# Patient Record
Sex: Male | Born: 1945 | Race: White | Hispanic: No | Smoking: Light tobacco smoker
Health system: Southern US, Community
[De-identification: ages and names within clinical notes are randomized; demographics above are authoritative.]

## PROBLEM LIST (undated history)

## (undated) DIAGNOSIS — I10 Essential (primary) hypertension: Secondary | ICD-10-CM

## (undated) DIAGNOSIS — C801 Malignant (primary) neoplasm, unspecified: Secondary | ICD-10-CM

## (undated) DIAGNOSIS — E785 Hyperlipidemia, unspecified: Secondary | ICD-10-CM

## (undated) HISTORY — PX: SKIN CANCER EXCISION: SHX779

## (undated) HISTORY — DX: Hyperlipidemia, unspecified: E78.5

## (undated) HISTORY — DX: Malignant (primary) neoplasm, unspecified: C80.1

## (undated) HISTORY — DX: Essential (primary) hypertension: I10

---

## 2012-10-18 ENCOUNTER — Telehealth: Payer: Self-pay | Admitting: Family Medicine

## 2012-10-18 NOTE — Telephone Encounter (Signed)
Refused penile pump, pt. Has never been seen for this reason. Left pt a message. Company notified.

## 2012-10-18 NOTE — Telephone Encounter (Signed)
COMPANY IS CHECKING ON STATUS OF REQUEST

## 2013-01-02 ENCOUNTER — Other Ambulatory Visit: Payer: Self-pay

## 2013-01-02 MED ORDER — HYDROCHLOROTHIAZIDE 25 MG PO TABS: 25.0000 mg | ORAL_TABLET | Freq: Every day | ORAL | Status: DC

## 2013-01-02 MED ORDER — PRAVASTATIN SODIUM 40 MG PO TABS: 40.0000 mg | ORAL_TABLET | Freq: Every day | ORAL | Status: DC

## 2013-01-02 NOTE — Telephone Encounter (Signed)
Last seen 07/04/12    Last labs 6/13

## 2013-01-03 ENCOUNTER — Telehealth: Payer: Self-pay | Admitting: *Deleted

## 2013-01-03 NOTE — Telephone Encounter (Signed)
Called pt regarding RXs Per pt he has enough until appt on 01/04/2013 with MMM

## 2013-01-04 ENCOUNTER — Encounter: Payer: Self-pay | Admitting: Nurse Practitioner

## 2013-01-04 ENCOUNTER — Ambulatory Visit (INDEPENDENT_AMBULATORY_CARE_PROVIDER_SITE_OTHER): Payer: Medicare Other | Admitting: Nurse Practitioner

## 2013-01-04 VITALS — BP 103/56 | HR 87 | Temp 96.6°F | Ht 72.0 in | Wt 242.0 lb

## 2013-01-04 DIAGNOSIS — Z125 Encounter for screening for malignant neoplasm of prostate: Secondary | ICD-10-CM

## 2013-01-04 DIAGNOSIS — E785 Hyperlipidemia, unspecified: Secondary | ICD-10-CM | POA: Insufficient documentation

## 2013-01-04 DIAGNOSIS — I10 Essential (primary) hypertension: Secondary | ICD-10-CM

## 2013-01-04 LAB — COMPLETE METABOLIC PANEL WITH GFR
Alkaline Phosphatase: 78 U/L (ref 39–117)
BUN: 24 mg/dL — ABNORMAL HIGH (ref 6–23)
Creat: 2.54 mg/dL — ABNORMAL HIGH (ref 0.50–1.35)
GFR, Est Non African American: 25 mL/min — ABNORMAL LOW
Glucose, Bld: 121 mg/dL — ABNORMAL HIGH (ref 70–99)
Total Bilirubin: 0.8 mg/dL (ref 0.3–1.2)

## 2013-01-04 LAB — PSA: PSA: 0.2 ng/mL (ref <=4.00)

## 2013-01-04 MED ORDER — LOSARTAN POTASSIUM 100 MG PO TABS: 100.0000 mg | ORAL_TABLET | Freq: Every day | ORAL | Status: DC

## 2013-01-04 MED ORDER — HYDROCHLOROTHIAZIDE 25 MG PO TABS: 25.0000 mg | ORAL_TABLET | Freq: Every day | ORAL | Status: DC

## 2013-01-04 MED ORDER — PRAVASTATIN SODIUM 40 MG PO TABS: 40.0000 mg | ORAL_TABLET | Freq: Every day | ORAL | Status: DC

## 2013-01-04 NOTE — Progress Notes (Signed)
  Subjective:    Patient ID: Dean Mcconnell, male    DOB: [birth year], 67 y.o.   MRN: [id number]  Hypertension This is a chronic problem. The current episode started more than 1 year ago. The problem has been resolved since onset. The problem is controlled. Pertinent negatives include no blurred vision, chest pain, headaches, malaise/fatigue, peripheral edema or shortness of breath. There are no associated agents to hypertension. Risk factors for coronary artery disease include dyslipidemia, obesity and male gender. Past treatments include angiotensin blockers and diuretics. The current treatment provides significant improvement. There are no compliance problems.   Hyperlipidemia This is a chronic problem. The current episode started more than 1 year ago. The problem is controlled. Recent lipid tests were reviewed and are normal. Exacerbating diseases include obesity. There are no known factors aggravating his hyperlipidemia. Pertinent negatives include no chest pain or shortness of breath. Current antihyperlipidemic treatment includes statins. The current treatment provides moderate improvement of lipids. There are no compliance problems.  Risk factors for coronary artery disease include hypertension, male sex and obesity.      Review of Systems  Constitutional: Negative for malaise/fatigue.  Eyes: Negative for blurred vision.  Respiratory: Negative for shortness of breath.   Cardiovascular: Negative for chest pain.  Neurological: Negative for headaches.  All other systems reviewed and are negative.       Objective:   Physical Exam  Constitutional: He is oriented to person, place, and time. He appears well-developed and well-nourished.  HENT:  Head: Normocephalic.  Right Ear: External ear normal.  Left Ear: External ear normal.  Nose: Nose normal.  Mouth/Throat: Oropharynx is clear and moist.  Eyes: EOM are normal. Pupils are equal, round, and reactive to light.  Neck: Normal range of  motion. Neck supple. No thyromegaly present.  Cardiovascular: Normal rate, regular rhythm, normal heart sounds and intact distal pulses.   No murmur heard. Pulmonary/Chest: Effort normal and breath sounds normal. He has no wheezes. He has no rales.  Abdominal: Soft. Bowel sounds are normal.  Genitourinary: Prostate normal and penis normal.  Musculoskeletal: Normal range of motion.  Neurological: He is alert and oriented to person, place, and time.  Skin: Skin is warm and dry.  Psychiatric: He has a normal mood and affect. His behavior is normal. Judgment and thought content normal.  BP 103/56  Pulse 87  Temp(Src) 96.6 F (35.9 C) (Oral)  Ht 6' (1.829 m)  Wt 242 lb (109.77 kg)  BMI 32.81 kg/m2         Assessment & Plan:  1. Hypertension Low NA+ diet - COMPLETE METABOLIC PANEL WITH GFR - hydrochlorothiazide (HYDRODIURIL) 25 MG tablet; Take 1 tablet (25 mg total) by mouth daily.  Dispense: 30 tablet; Refill: 5 - losartan (COZAAR) 100 MG tablet; Take 1 tablet (100 mg total) by mouth daily.  Dispense: 30 tablet; Refill: 5  2. Hyperlipidemia Low fat diet and exercise - NMR Lipoprofile with Lipids - pravastatin (PRAVACHOL) 40 MG tablet; Take 1 tablet (40 mg total) by mouth daily.  Dispense: 30 tablet; Refill: 5  3. Prostate cancer screening - PSA  * DRE doen 12/13- By A. MAier Mary-Margaret Daphine Deutscher, FNP

## 2013-01-04 NOTE — Patient Instructions (Signed)
Health Maintenance, Males A healthy lifestyle and preventative care can promote health and wellness.  Maintain regular health, dental, and eye exams.  Eat a healthy diet. Foods like vegetables, fruits, whole grains, low-fat dairy products, and lean protein foods contain the nutrients you need without too many calories. Decrease your intake of foods high in solid fats, added sugars, and salt. Get information about a proper diet from your caregiver, if necessary.  Regular physical exercise is one of the most important things you can do for your health. Most adults should get at least 150 minutes of moderate-intensity exercise (any activity that increases your heart rate and causes you to sweat) each week. In addition, most adults need muscle-strengthening exercises on 2 or more days a week.   Maintain a healthy weight. The body mass index (BMI) is a screening tool to identify possible weight problems. It provides an estimate of body fat based on height and weight. Your caregiver can help determine your BMI, and can help you achieve or maintain a healthy weight. For adults 20 years and older:  A BMI below 18.5 is considered underweight.  A BMI of 18.5 to 24.9 is normal.  A BMI of 25 to 29.9 is considered overweight.  A BMI of 30 and above is considered obese.  Maintain normal blood lipids and cholesterol by exercising and minimizing your intake of saturated fat. Eat a balanced diet with plenty of fruits and vegetables. Blood tests for lipids and cholesterol should begin at age 20 and be repeated every 5 years. If your lipid or cholesterol levels are high, you are over 50, or you are a high risk for heart disease, you may need your cholesterol levels checked more frequently.Ongoing high lipid and cholesterol levels should be treated with medicines, if diet and exercise are not effective.  If you smoke, find out from your caregiver how to quit. If you do not use tobacco, do not start.  If you  choose to drink alcohol, do not exceed 2 drinks per day. One drink is considered to be 12 ounces (355 mL) of beer, 5 ounces (148 mL) of wine, or 1.5 ounces (44 mL) of liquor.  Avoid use of street drugs. Do not share needles with anyone. Ask for help if you need support or instructions about stopping the use of drugs.  High blood pressure causes heart disease and increases the risk of stroke. Blood pressure should be checked at least every 1 to 2 years. Ongoing high blood pressure should be treated with medicines if weight loss and exercise are not effective.  If you are 45 to 66 years old, ask your caregiver if you should take aspirin to prevent heart disease.  Diabetes screening involves taking a blood sample to check your fasting blood sugar level. This should be done once every 3 years, after age 45, if you are within normal weight and without risk factors for diabetes. Testing should be considered at a younger age or be carried out more frequently if you are overweight and have at least 1 risk factor for diabetes.  Colorectal cancer can be detected and often prevented. Most routine colorectal cancer screening begins at the age of 50 and continues through age 75. However, your caregiver may recommend screening at an earlier age if you have risk factors for colon cancer. On a yearly basis, your caregiver may provide home test kits to check for hidden blood in the stool. Use of a small camera at the end of a tube,   to directly examine the colon (sigmoidoscopy or colonoscopy), can detect the earliest forms of colorectal cancer. Talk to your caregiver about this at age 50, when routine screening begins. Direct examination of the colon should be repeated every 5 to 10 years through age 75, unless early forms of pre-cancerous polyps or small growths are found.  Hepatitis C blood testing is recommended for all people born from 1945 through 1965 and any individual with known risks for hepatitis C.  Healthy  men should no longer receive prostate-specific antigen (PSA) blood tests as part of routine cancer screening. Consult with your caregiver about prostate cancer screening.  Testicular cancer screening is not recommended for adolescents or adult males who have no symptoms. Screening includes self-exam, caregiver exam, and other screening tests. Consult with your caregiver about any symptoms you have or any concerns you have about testicular cancer.  Practice safe sex. Use condoms and avoid high-risk sexual practices to reduce the spread of sexually transmitted infections (STIs).  Use sunscreen with a sun protection factor (SPF) of 30 or greater. Apply sunscreen liberally and repeatedly throughout the day. You should seek shade when your shadow is shorter than you. Protect yourself by wearing long sleeves, pants, a wide-brimmed hat, and sunglasses year round, whenever you are outdoors.  Notify your caregiver of new moles or changes in moles, especially if there is a change in shape or color. Also notify your caregiver if a mole is larger than the size of a pencil eraser.  A one-time screening for abdominal aortic aneurysm (AAA) and surgical repair of large AAAs by sound wave imaging (ultrasonography) is recommended for ages 65 to 75 years who are current or former smokers.  Stay current with your immunizations. Document Released: 01/16/2008 Document Revised: 10/12/2011 Document Reviewed: 12/15/2010 [employer] Patient Information 2014 [employer].  

## 2013-01-05 ENCOUNTER — Other Ambulatory Visit: Payer: Self-pay | Admitting: Nurse Practitioner

## 2013-01-05 ENCOUNTER — Other Ambulatory Visit: Payer: Self-pay | Admitting: *Deleted

## 2013-01-05 DIAGNOSIS — I10 Essential (primary) hypertension: Secondary | ICD-10-CM

## 2013-01-05 LAB — NMR LIPOPROFILE WITH LIPIDS
HDL Size: 8.3 nm — ABNORMAL LOW (ref >=9.2)
HDL-C: 30 mg/dL — ABNORMAL LOW (ref >=40)
LDL (calc): 94 mg/dL (ref <100)
LDL Particle Number: 1636 nmol/L — ABNORMAL HIGH (ref <1000)
LP-IR Score: 66 — ABNORMAL HIGH (ref <=45)
Large VLDL-P: 2.3 nmol/L (ref <=2.7)
Triglycerides: 153 mg/dL — ABNORMAL HIGH (ref <150)
VLDL Size: 44.4 nm (ref <=46.6)

## 2013-01-05 MED ORDER — HYDROCHLOROTHIAZIDE 25 MG PO TABS: 25.0000 mg | ORAL_TABLET | Freq: Every day | ORAL | Status: DC

## 2013-01-05 MED ORDER — ATORVASTATIN CALCIUM 40 MG PO TABS: 40.0000 mg | ORAL_TABLET | Freq: Every day | ORAL | Status: DC

## 2013-01-05 MED ORDER — LOSARTAN POTASSIUM 100 MG PO TABS: 100.0000 mg | ORAL_TABLET | Freq: Every day | ORAL | Status: DC

## 2013-04-10 ENCOUNTER — Ambulatory Visit (INDEPENDENT_AMBULATORY_CARE_PROVIDER_SITE_OTHER): Payer: Medicare Other | Admitting: Nurse Practitioner

## 2013-04-10 ENCOUNTER — Encounter: Payer: Self-pay | Admitting: Nurse Practitioner

## 2013-04-10 VITALS — BP 134/80 | HR 73 | Temp 97.1°F | Ht 72.0 in | Wt 230.0 lb

## 2013-04-10 DIAGNOSIS — E785 Hyperlipidemia, unspecified: Secondary | ICD-10-CM

## 2013-04-10 DIAGNOSIS — I10 Essential (primary) hypertension: Secondary | ICD-10-CM

## 2013-04-10 MED ORDER — SILDENAFIL CITRATE 100 MG PO TABS: 50.0000 mg | ORAL_TABLET | Freq: Every day | ORAL | Status: DC | PRN

## 2013-04-10 MED ORDER — HYDROCHLOROTHIAZIDE 25 MG PO TABS: 25.0000 mg | ORAL_TABLET | Freq: Every day | ORAL | Status: DC

## 2013-04-10 MED ORDER — SILDENAFIL CITRATE 100 MG PO TABS: 100.0000 mg | ORAL_TABLET | Freq: Every day | ORAL | Status: DC | PRN

## 2013-04-10 MED ORDER — LOSARTAN POTASSIUM 100 MG PO TABS: 100.0000 mg | ORAL_TABLET | Freq: Every day | ORAL | Status: DC

## 2013-04-10 MED ORDER — ATORVASTATIN CALCIUM 40 MG PO TABS: 40.0000 mg | ORAL_TABLET | Freq: Every day | ORAL | Status: DC

## 2013-04-10 NOTE — Progress Notes (Signed)
  Subjective:    Patient ID: Dean Mcconnell, male    DOB: 12-27-1945, 67 y.o.   MRN: [id number]  Hypertension This is a chronic problem. The current episode started more than 1 year ago. The problem has been resolved since onset. The problem is controlled. Pertinent negatives include no blurred vision, chest pain, headaches, malaise/fatigue, peripheral edema or shortness of breath. There are no associated agents to hypertension. Risk factors for coronary artery disease include dyslipidemia, obesity and male gender. Past treatments include angiotensin blockers and diuretics. The current treatment provides significant improvement. There are no compliance problems.   Hyperlipidemia This is a chronic problem. The current episode started more than 1 year ago. The problem is controlled. Recent lipid tests were reviewed and are normal. Exacerbating diseases include obesity. There are no known factors aggravating his hyperlipidemia. Pertinent negatives include no chest pain or shortness of breath. Current antihyperlipidemic treatment includes statins. The current treatment provides moderate improvement of lipids. There are no compliance problems.  Risk factors for coronary artery disease include hypertension, male sex and obesity.      Review of Systems  Constitutional: Negative for malaise/fatigue.  Eyes: Negative for blurred vision.  Respiratory: Negative for shortness of breath.   Cardiovascular: Negative for chest pain.  Neurological: Negative for headaches.  All other systems reviewed and are negative.       Objective:   Physical Exam  Constitutional: He is oriented to person, place, and time. He appears well-developed and well-nourished.  HENT:  Head: Normocephalic.  Right Ear: External ear normal.  Left Ear: External ear normal.  Nose: Nose normal.  Mouth/Throat: Oropharynx is clear and moist.  Eyes: EOM are normal. Pupils are equal, round, and reactive to light.  Neck: Normal range of  motion. Neck supple. No thyromegaly present.  Cardiovascular: Normal rate, regular rhythm, normal heart sounds and intact distal pulses.   No murmur heard. Pulmonary/Chest: Effort normal and breath sounds normal. He has no wheezes. He has no rales.  Abdominal: Soft. Bowel sounds are normal.  Genitourinary: Prostate normal and penis normal.  Musculoskeletal: Normal range of motion.  Neurological: He is alert and oriented to person, place, and time.  Skin: Skin is warm and dry.  Psychiatric: He has a normal mood and affect. His behavior is normal. Judgment and thought content normal.  BP 134/80  Pulse 73  Temp(Src) 97.1 F (36.2 C) (Oral)  Ht 6' (1.829 m)  Wt 230 lb (104.327 kg)  BMI 31.19 kg/m2         Assessment & Plan:  1. Hypertension Low NA= diet - CMP14+EGFR - hydrochlorothiazide (HYDRODIURIL) 25 MG tablet; Take 1 tablet (25 mg total) by mouth daily.  Dispense: 90 tablet; Refill: 1 - losartan (COZAAR) 100 MG tablet; Take 1 tablet (100 mg total) by mouth daily.  Dispense: 90 tablet; Refill: 1  2. Hyperlipidemia Low fat diet and exercise - NMR, lipoprofile - atorvastatin (LIPITOR) 40 MG tablet; Take 1 tablet (40 mg total) by mouth daily.  Dispense: 90 tablet; Refill: 1  Follow up in 3 months Health maintenance reviewed  Mary-Margaret Daphine Deutscher, FNP

## 2013-04-10 NOTE — Patient Instructions (Addendum)

## 2013-06-01 ENCOUNTER — Telehealth: Payer: Self-pay | Admitting: Nurse Practitioner

## 2013-06-01 DIAGNOSIS — I10 Essential (primary) hypertension: Secondary | ICD-10-CM

## 2013-06-01 MED ORDER — LOSARTAN POTASSIUM 100 MG PO TABS: 100.0000 mg | ORAL_TABLET | Freq: Every day | ORAL | Status: DC

## 2013-06-01 MED ORDER — HYDROCHLOROTHIAZIDE 25 MG PO TABS: 25.0000 mg | ORAL_TABLET | Freq: Every day | ORAL | Status: DC

## 2013-06-01 NOTE — Telephone Encounter (Signed)
rx sent to pharmacy- will pay the same for a few meds as he would for a month supply

## 2013-07-10 ENCOUNTER — Ambulatory Visit (INDEPENDENT_AMBULATORY_CARE_PROVIDER_SITE_OTHER): Payer: Medicare Other | Admitting: Nurse Practitioner

## 2013-07-10 ENCOUNTER — Encounter: Payer: Self-pay | Admitting: Nurse Practitioner

## 2013-07-10 VITALS — BP 137/71 | HR 68 | Temp 97.0°F | Ht 72.0 in | Wt 215.0 lb

## 2013-07-10 DIAGNOSIS — E785 Hyperlipidemia, unspecified: Secondary | ICD-10-CM

## 2013-07-10 DIAGNOSIS — I10 Essential (primary) hypertension: Secondary | ICD-10-CM

## 2013-07-10 MED ORDER — ATORVASTATIN CALCIUM 40 MG PO TABS
40.0000 mg | ORAL_TABLET | Freq: Every day | ORAL | Status: DC
Start: 2013-07-10 — End: 2013-10-11

## 2013-07-10 MED ORDER — HYDROCHLOROTHIAZIDE 25 MG PO TABS: 25.0000 mg | ORAL_TABLET | Freq: Every day | ORAL | Status: DC

## 2013-07-10 MED ORDER — LOSARTAN POTASSIUM 100 MG PO TABS: 100.0000 mg | ORAL_TABLET | Freq: Every day | ORAL | Status: DC

## 2013-07-10 NOTE — Patient Instructions (Signed)

## 2013-07-10 NOTE — Progress Notes (Signed)
Subjective:    Patient ID: Dean Mcconnell, male    DOB: 1946-04-04, 67 y.o.   MRN: [id number]  Hypertension This is a chronic problem. The current episode started more than 1 year ago. The problem has been resolved since onset. The problem is controlled. Pertinent negatives include no blurred vision, chest pain, headaches, malaise/fatigue, peripheral edema or shortness of breath. There are no associated agents to hypertension. Risk factors for coronary artery disease include dyslipidemia, obesity and male gender. Past treatments include angiotensin blockers and diuretics. The current treatment provides significant improvement. There are no compliance problems.   Hyperlipidemia This is a chronic problem. The current episode started more than 1 year ago. The problem is controlled. Recent lipid tests were reviewed and are normal. Exacerbating diseases include obesity. There are no known factors aggravating his hyperlipidemia. Pertinent negatives include no chest pain or shortness of breath. Current antihyperlipidemic treatment includes statins. The current treatment provides moderate improvement of lipids. There are no compliance problems.  Risk factors for coronary artery disease include hypertension, male sex and obesity.      Review of Systems  Constitutional: Negative for malaise/fatigue.  Eyes: Negative for blurred vision.  Respiratory: Negative for shortness of breath.   Cardiovascular: Negative for chest pain.  Neurological: Negative for headaches.  All other systems reviewed and are negative.       Objective:   Physical Exam  Constitutional: He is oriented to person, place, and time. He appears well-developed and well-nourished.  HENT:  Head: Normocephalic.  Right Ear: External ear normal.  Left Ear: External ear normal.  Nose: Nose normal.  Mouth/Throat: Oropharynx is clear and moist.  Eyes: EOM are normal. Pupils are equal, round, and reactive to light.  Neck: Normal range of  motion. Neck supple. No thyromegaly present.  Cardiovascular: Normal rate, regular rhythm, normal heart sounds and intact distal pulses.   No murmur heard. Pulmonary/Chest: Effort normal and breath sounds normal. He has no wheezes. He has no rales.  Abdominal: Soft. Bowel sounds are normal.  Genitourinary: Penis normal.  Musculoskeletal: Normal range of motion.  Neurological: He is alert and oriented to person, place, and time.  Skin: Skin is warm and dry.  Psychiatric: He has a normal mood and affect. His behavior is normal. Judgment and thought content normal.  BP 137/71  Pulse 68  Temp(Src) 97 F (36.1 C) (Oral)  Ht 6' (1.829 m)  Wt 215 lb (97.523 kg)  BMI 29.15 kg/m2         Assessment & Plan:   1. Hypertension   2. Hyperlipidemia    Orders Placed This Encounter  Procedures  . CMP14+EGFR  . NMR, lipoprofile   Meds ordered this encounter  Medications  . atorvastatin (LIPITOR) 40 MG tablet    Sig: Take 1 tablet (40 mg total) by mouth daily.    Dispense:  93 tablet    Refill:  1    Order Specific Question:  Supervising Provider    Answer:  Ernestina Penna [[id number]]  . hydrochlorothiazide (HYDRODIURIL) 25 MG tablet    Sig: Take 1 tablet (25 mg total) by mouth daily.    Dispense:  93 tablet    Refill:  1    Order Specific Question:  Supervising Provider    Answer:  Ernestina Penna [[id number]]  . losartan (COZAAR) 100 MG tablet    Sig: Take 1 tablet (100 mg total) by mouth daily.    Dispense:  93 tablet    Refill:  1  Order Specific Question:  Supervising Provider    Answer:  Ernestina Penna [[id number]]    Continue all meds Labs pending Diet and exercise encouraged Health maintenance reviewed Follow up in 3 months  Mary-Margaret Daphine Deutscher, FNP

## 2013-07-12 LAB — NMR, LIPOPROFILE
HDL Cholesterol by NMR: 36 mg/dL — ABNORMAL LOW (ref >=40)
HDL Particle Number: 28.3 umol/L — ABNORMAL LOW (ref >=30.5)
LDL Size: 19.7 nm — ABNORMAL LOW (ref >20.5)
LP-IR Score: 60 — ABNORMAL HIGH (ref <=45)
Small LDL Particle Number: 729 nmol/L — ABNORMAL HIGH (ref <=527)
Triglycerides by NMR: 88 mg/dL (ref <150)

## 2013-07-12 LAB — CMP14+EGFR
ALT: 6 IU/L (ref 0–44)
AST: 11 IU/L (ref 0–40)
Albumin/Globulin Ratio: 2.2 (ref 1.1–2.5)
Alkaline Phosphatase: 111 IU/L (ref 39–117)
Chloride: 99 mmol/L (ref 97–108)
Creatinine, Ser: 1.53 mg/dL — ABNORMAL HIGH (ref 0.76–1.27)
GFR calc Af Amer: 54 mL/min/{1.73_m2} — ABNORMAL LOW (ref >59)
GFR calc non Af Amer: 47 mL/min/{1.73_m2} — ABNORMAL LOW (ref >59)
Globulin, Total: 2 g/dL (ref 1.5–4.5)
Glucose: 100 mg/dL — ABNORMAL HIGH (ref 65–99)
Potassium: 3.5 mmol/L (ref 3.5–5.2)
Sodium: 145 mmol/L — ABNORMAL HIGH (ref 134–144)
Total Bilirubin: 0.6 mg/dL (ref 0.0–1.2)
Total Protein: 6.3 g/dL (ref 6.0–8.5)

## 2013-07-24 ENCOUNTER — Telehealth: Payer: Self-pay | Admitting: Family Medicine

## 2013-07-24 NOTE — Telephone Encounter (Signed)
PT AWARE OF LAB RESULTS.  RS °

## 2013-07-24 NOTE — Telephone Encounter (Signed)
Message copied by Azalee Course on Mon Jul 24, 2013 12:31 PM ------      Message from: Bennie Pierini      Created: Wed Jul 12, 2013  2:17 PM       Kidney function has greatly improved      Cholesterol looks great      Continue current meds and recheck in 3 months       ------

## 2013-09-04 ENCOUNTER — Telehealth: Payer: Self-pay | Admitting: Nurse Practitioner

## 2013-09-04 NOTE — Telephone Encounter (Signed)
Quashawn says that his lady friend has a rash on her left shoulder. She has bursitis and has been taking over the counter pain meds to help with pain. Says that now she has a rash that itches and has turned to blisters. Is not painful. Sherrick Araki that more than likely its an allergic reaction to some type of med. Comer states that he bought some hydrocortisone cream and it has helped. Advised that if rash continues that lady NTBS

## 2013-10-11 ENCOUNTER — Ambulatory Visit (INDEPENDENT_AMBULATORY_CARE_PROVIDER_SITE_OTHER): Payer: Medicare Other

## 2013-10-11 ENCOUNTER — Encounter: Payer: Self-pay | Admitting: Nurse Practitioner

## 2013-10-11 ENCOUNTER — Ambulatory Visit (INDEPENDENT_AMBULATORY_CARE_PROVIDER_SITE_OTHER): Payer: Medicare Other | Admitting: Nurse Practitioner

## 2013-10-11 VITALS — BP 118/70 | HR 73 | Temp 97.4°F | Ht 70.0 in | Wt 220.0 lb

## 2013-10-11 DIAGNOSIS — F17200 Nicotine dependence, unspecified, uncomplicated: Secondary | ICD-10-CM

## 2013-10-11 DIAGNOSIS — I10 Essential (primary) hypertension: Secondary | ICD-10-CM

## 2013-10-11 DIAGNOSIS — N529 Male erectile dysfunction, unspecified: Secondary | ICD-10-CM | POA: Insufficient documentation

## 2013-10-11 DIAGNOSIS — E785 Hyperlipidemia, unspecified: Secondary | ICD-10-CM

## 2013-10-11 MED ORDER — HYDROCHLOROTHIAZIDE 25 MG PO TABS: 25.0000 mg | ORAL_TABLET | Freq: Every day | ORAL | Status: DC

## 2013-10-11 MED ORDER — ATORVASTATIN CALCIUM 40 MG PO TABS: 40.0000 mg | ORAL_TABLET | Freq: Every day | ORAL | Status: DC

## 2013-10-11 MED ORDER — LOSARTAN POTASSIUM 100 MG PO TABS: 100.0000 mg | ORAL_TABLET | Freq: Every day | ORAL | Status: DC

## 2013-10-11 NOTE — Progress Notes (Addendum)
Subjective:    Patient ID: Dean Mcconnell, male    DOB: 10-Sep-1945, 68 y.o.   MRN: [id number]  Patient here today for follow of chronic medical problems- No complaints today  Hypertension This is a chronic problem. The current episode started more than 1 year ago. The problem has been resolved since onset. The problem is controlled. Pertinent negatives include no blurred vision, chest pain, headaches, malaise/fatigue, peripheral edema or shortness of breath. There are no associated agents to hypertension. Risk factors for coronary artery disease include dyslipidemia, obesity and male gender. Past treatments include angiotensin blockers and diuretics. The current treatment provides significant improvement. There are no compliance problems.   Hyperlipidemia This is a chronic problem. The current episode started more than 1 year ago. The problem is controlled. Recent lipid tests were reviewed and are normal. Exacerbating diseases include obesity. There are no known factors aggravating his hyperlipidemia. Pertinent negatives include no chest pain or shortness of breath. Current antihyperlipidemic treatment includes statins. The current treatment provides moderate improvement of lipids. There are no compliance problems.  Risk factors for coronary artery disease include hypertension, male sex and obesity.  erectile dysfunction Uses viagra when he can afford to get.   Review of Systems  Constitutional: Negative for malaise/fatigue.  Eyes: Negative for blurred vision.  Respiratory: Negative for shortness of breath.   Cardiovascular: Negative for chest pain.  Neurological: Negative for headaches.  All other systems reviewed and are negative.       Objective:   Physical Exam  Constitutional: He is oriented to person, place, and time. He appears well-developed and well-nourished.  HENT:  Head: Normocephalic.  Right Ear: External ear normal.  Left Ear: External ear normal.  Nose: Nose normal.   Mouth/Throat: Oropharynx is clear and moist.  Eyes: EOM are normal. Pupils are equal, round, and reactive to light.  Neck: Normal range of motion. Neck supple. No thyromegaly present.  Cardiovascular: Normal rate, regular rhythm, normal heart sounds and intact distal pulses.   No murmur heard. Pulmonary/Chest: Effort normal and breath sounds normal. He has no wheezes. He has no rales.  Abdominal: Soft. Bowel sounds are normal.  Genitourinary: Penis normal.  Musculoskeletal: Normal range of motion.  Neurological: He is alert and oriented to person, place, and time.  Skin: Skin is warm and dry.  Psychiatric: He has a normal mood and affect. His behavior is normal. Judgment and thought content normal.  BP 118/70  Pulse 73  Temp(Src) 97.4 F (36.3 C) (Oral)  Ht $R'5\' 10"'oU$  (1.778 m)  Wt 220 lb (99.791 kg)  BMI 31.57 kg/m2  Chest x ray-lear-Preliminary reading by Ronnald Collum, FNP  [facility]  EKG- NSR- Preliminary reading by Ronnald Collum, FNP  [facility]        Assessment & Plan:   1. Hypertension   2. Hyperlipidemia   3. Erectile dysfunction   4. Smoker    Orders Placed This Encounter  Procedures  . DG Chest 2 View    Standing Status: Future     Number of Occurrences: 1     Standing Expiration Date: 12/11/2014    Order Specific Question:  Reason for Exam (SYMPTOM  OR DIAGNOSIS REQUIRED)    Answer:  smoker    Order Specific Question:  Preferred imaging location?    Answer:  Internal  . CMP14+EGFR  . NMR, lipoprofile  . EKG 12-Lead   Meds ordered this encounter  Medications  . atorvastatin (LIPITOR) 40 MG tablet    Sig: Take 1 tablet (  40 mg total) by mouth daily.    Dispense:  93 tablet    Refill:  1    Order Specific Question:  Supervising Provider    Answer:  Chipper Herb [[id number]]  . hydrochlorothiazide (HYDRODIURIL) 25 MG tablet    Sig: Take 1 tablet (25 mg total) by mouth daily.    Dispense:  93 tablet    Refill:  1    Order Specific Question:  Supervising Provider     Answer:  Chipper Herb [[id number]]  . losartan (COZAAR) 100 MG tablet    Sig: Take 1 tablet (100 mg total) by mouth daily.    Dispense:  93 tablet    Refill:  1    Order Specific Question:  Supervising Provider    Answer:  Chipper Herb [[id number]]    Labs pending Health maintenance reviewed Diet and exercise encouraged Continue all meds Follow up  In 3 months   [place], FNP

## 2013-10-11 NOTE — Patient Instructions (Signed)

## 2013-10-13 LAB — CMP14+EGFR
A/G RATIO: 2 (ref 1.1–2.5)
ALT: 11 IU/L (ref 0–44)
AST: 11 IU/L (ref 0–40)
Albumin: 4.4 g/dL (ref 3.6–4.8)
Alkaline Phosphatase: 99 IU/L (ref 39–117)
BUN/Creatinine Ratio: 9 — ABNORMAL LOW (ref 10–22)
BUN: 13 mg/dL (ref 8–27)
CALCIUM: 9.2 mg/dL (ref 8.6–10.2)
CO2: 27 mmol/L (ref 18–29)
CREATININE: 1.43 mg/dL — AB (ref 0.76–1.27)
Chloride: 99 mmol/L (ref 97–108)
GFR calc Af Amer: 59 mL/min/{1.73_m2} — ABNORMAL LOW (ref >59)
GFR, EST NON AFRICAN AMERICAN: 51 mL/min/{1.73_m2} — AB (ref >59)
GLOBULIN, TOTAL: 2.2 g/dL (ref 1.5–4.5)
GLUCOSE: 103 mg/dL — AB (ref 65–99)
Potassium: 4 mmol/L (ref 3.5–5.2)
SODIUM: 143 mmol/L (ref 134–144)
TOTAL PROTEIN: 6.6 g/dL (ref 6.0–8.5)
Total Bilirubin: 0.6 mg/dL (ref 0.0–1.2)

## 2013-10-13 LAB — NMR, LIPOPROFILE
Cholesterol: 128 mg/dL (ref <200)
HDL Cholesterol by NMR: 46 mg/dL (ref >=40)
HDL Particle Number: 32.1 umol/L (ref >=30.5)
LDL Particle Number: 903 nmol/L (ref <1000)
LDL Size: 21 nm (ref >20.5)
LDLC SERPL CALC-MCNC: 65 mg/dL (ref <100)
LP-IR Score: 40 (ref <=45)
SMALL LDL PARTICLE NUMBER: 280 nmol/L (ref <=527)
TRIGLYCERIDES BY NMR: 83 mg/dL (ref <150)

## 2013-12-01 ENCOUNTER — Encounter: Payer: Self-pay | Admitting: *Deleted

## 2014-01-19 ENCOUNTER — Ambulatory Visit (INDEPENDENT_AMBULATORY_CARE_PROVIDER_SITE_OTHER): Payer: Medicare Other | Admitting: Nurse Practitioner

## 2014-01-19 ENCOUNTER — Encounter: Payer: Self-pay | Admitting: Nurse Practitioner

## 2014-01-19 VITALS — BP 120/64 | HR 79 | Temp 98.0°F | Ht 70.0 in | Wt 208.2 lb

## 2014-01-19 DIAGNOSIS — I10 Essential (primary) hypertension: Secondary | ICD-10-CM

## 2014-01-19 DIAGNOSIS — N529 Male erectile dysfunction, unspecified: Secondary | ICD-10-CM

## 2014-01-19 DIAGNOSIS — Z125 Encounter for screening for malignant neoplasm of prostate: Secondary | ICD-10-CM

## 2014-01-19 DIAGNOSIS — E785 Hyperlipidemia, unspecified: Secondary | ICD-10-CM

## 2014-01-19 MED ORDER — HYDROCHLOROTHIAZIDE 25 MG PO TABS: 25.0000 mg | ORAL_TABLET | Freq: Every day | ORAL | Status: DC

## 2014-01-19 MED ORDER — LOSARTAN POTASSIUM 100 MG PO TABS: 100.0000 mg | ORAL_TABLET | Freq: Every day | ORAL | Status: DC

## 2014-01-19 MED ORDER — ATORVASTATIN CALCIUM 40 MG PO TABS: 40.0000 mg | ORAL_TABLET | Freq: Every day | ORAL | Status: DC

## 2014-01-19 NOTE — Patient Instructions (Signed)

## 2014-01-19 NOTE — Progress Notes (Signed)
Subjective:    Patient ID: Dean Mcconnell, male    DOB: 09/16/1945, 68 y.o.   MRN: [id number]  Patient here today for follow of chronic medical problems- No complaints today. Patient says that he has completely changed his diet and has been exercising daily- says that he feels much better and energy level has improved.  Hypertension This is a chronic problem. The current episode started more than 1 year ago. The problem has been resolved since onset. The problem is controlled. Pertinent negatives include no blurred vision, chest pain, headaches, malaise/fatigue, peripheral edema or shortness of breath. There are no associated agents to hypertension. Risk factors for coronary artery disease include dyslipidemia, obesity and male gender. Past treatments include angiotensin blockers and diuretics. The current treatment provides significant improvement. There are no compliance problems.   Hyperlipidemia This is a chronic problem. The current episode started more than 1 year ago. The problem is controlled. Recent lipid tests were reviewed and are normal. Exacerbating diseases include obesity. There are no known factors aggravating his hyperlipidemia. Pertinent negatives include no chest pain or shortness of breath. Current antihyperlipidemic treatment includes statins. The current treatment provides moderate improvement of lipids. There are no compliance problems.  Risk factors for coronary artery disease include hypertension, male sex and obesity.  erectile dysfunction Uses viagra when he can afford to get.   Review of Systems  Constitutional: Negative for malaise/fatigue.  Eyes: Negative for blurred vision.  Respiratory: Negative for shortness of breath.   Cardiovascular: Negative for chest pain.  Neurological: Negative for headaches.  All other systems reviewed and are negative.      Objective:   Physical Exam  Constitutional: He is oriented to person, place, and time. He appears well-developed  and well-nourished.  HENT:  Head: Normocephalic.  Right Ear: External ear normal.  Left Ear: External ear normal.  Nose: Nose normal.  Mouth/Throat: Oropharynx is clear and moist.  Eyes: EOM are normal. Pupils are equal, round, and reactive to light.  Neck: Normal range of motion. Neck supple. No thyromegaly present.  Cardiovascular: Normal rate, regular rhythm, normal heart sounds and intact distal pulses.   No murmur heard. Pulmonary/Chest: Effort normal and breath sounds normal. He has no wheezes. He has no rales.  Abdominal: Soft. Bowel sounds are normal.  Genitourinary: Penis normal.  Musculoskeletal: Normal range of motion.  Neurological: He is alert and oriented to person, place, and time.  Skin: Skin is warm and dry.  Psychiatric: He has a normal mood and affect. His behavior is normal. Judgment and thought content normal.  BP 120/64  Pulse 79  Temp(Src) 98 F (36.7 C) (Oral)  Ht $R'5\' 10"'HG$  (1.778 m)  Wt 208 lb 3.2 oz (94.439 kg)  BMI 29.87 kg/m2           Assessment & Plan:    1. Essential hypertension   2. Hyperlipidemia   3. Erectile dysfunction, unspecified erectile dysfunction type   4. Prostate cancer screening    Orders Placed This Encounter  Procedures  . CMP14+EGFR  . NMR, lipoprofile  . PSA, total and free   Meds ordered this encounter  Medications  . atorvastatin (LIPITOR) 40 MG tablet    Sig: Take 1 tablet (40 mg total) by mouth daily.    Dispense:  93 tablet    Refill:  1    Order Specific Question:  Supervising Provider    Answer:  Chipper Herb [[id number]]  . hydrochlorothiazide (HYDRODIURIL) 25 MG tablet    Sig:  Take 1 tablet (25 mg total) by mouth daily.    Dispense:  93 tablet    Refill:  1    Order Specific Question:  Supervising Provider    Answer:  Chipper Herb [[id number]]  . losartan (COZAAR) 100 MG tablet    Sig: Take 1 tablet (100 mg total) by mouth daily.    Dispense:  93 tablet    Refill:  1    Order Specific Question:   Supervising Provider    Answer:  Chipper Herb [[id number]]    Labs pending Health maintenance reviewed Diet and exercise encouraged Continue all meds Follow up  In 3 months   [place], FNP

## 2014-01-20 LAB — CMP14+EGFR
A/G RATIO: 1.8 (ref 1.1–2.5)
ALT: 12 IU/L (ref 0–44)
AST: 13 IU/L (ref 0–40)
Albumin: 4.2 g/dL (ref 3.6–4.8)
Alkaline Phosphatase: 111 IU/L (ref 39–117)
BILIRUBIN TOTAL: 0.7 mg/dL (ref 0.0–1.2)
BUN/Creatinine Ratio: 8 — ABNORMAL LOW (ref 10–22)
BUN: 14 mg/dL (ref 8–27)
CO2: 29 mmol/L (ref 18–29)
CREATININE: 1.65 mg/dL — AB (ref 0.76–1.27)
Calcium: 9.4 mg/dL (ref 8.6–10.2)
Chloride: 97 mmol/L (ref 97–108)
GFR, EST AFRICAN AMERICAN: 49 mL/min/{1.73_m2} — AB (ref >59)
GFR, EST NON AFRICAN AMERICAN: 43 mL/min/{1.73_m2} — AB (ref >59)
GLOBULIN, TOTAL: 2.4 g/dL (ref 1.5–4.5)
Glucose: 92 mg/dL (ref 65–99)
POTASSIUM: 3.7 mmol/L (ref 3.5–5.2)
SODIUM: 141 mmol/L (ref 134–144)
TOTAL PROTEIN: 6.6 g/dL (ref 6.0–8.5)

## 2014-01-20 LAB — NMR, LIPOPROFILE
Cholesterol: 137 mg/dL (ref 100–199)
HDL Cholesterol by NMR: 43 mg/dL (ref >39)
HDL Particle Number: 32.7 umol/L (ref >=30.5)
LDL Particle Number: 869 nmol/L (ref <1000)
LDL SIZE: 21.1 nm (ref >20.5)
LDLC SERPL CALC-MCNC: 70 mg/dL (ref 0–99)
LP-IR Score: 51 — ABNORMAL HIGH (ref <=45)
SMALL LDL PARTICLE NUMBER: 285 nmol/L (ref <=527)
TRIGLYCERIDES BY NMR: 118 mg/dL (ref 0–149)

## 2014-01-20 LAB — PSA, TOTAL AND FREE
PSA FREE: 0.1 ng/mL
PSA, Free Pct: 50 %
PSA: 0.2 ng/mL (ref 0.0–4.0)

## 2014-02-05 ENCOUNTER — Telehealth: Payer: Self-pay | Admitting: *Deleted

## 2014-02-05 NOTE — Telephone Encounter (Signed)
Aware of results to tell him.

## 2014-02-05 NOTE — Telephone Encounter (Signed)
Message copied by Shelbie Ammons on Mon Feb 05, 2014  3:40 PM ------      Message from: Chevis Pretty      Created: Sat Jan 20, 2014  9:35 AM       Kidney and liver function stable      Creatine still elevated- avoid all NSAIDS      Cholesterol looks great      Continue current meds- low fat diet and exercise and recheck in 3 months       ------

## 2014-04-23 ENCOUNTER — Ambulatory Visit: Payer: Medicare Other | Admitting: Nurse Practitioner

## 2014-06-07 ENCOUNTER — Encounter: Payer: Self-pay | Admitting: Nurse Practitioner

## 2014-06-07 ENCOUNTER — Ambulatory Visit (INDEPENDENT_AMBULATORY_CARE_PROVIDER_SITE_OTHER): Payer: Medicare Other | Admitting: Nurse Practitioner

## 2014-06-07 VITALS — BP 126/70 | HR 75 | Temp 97.7°F | Ht 70.0 in | Wt 207.4 lb

## 2014-06-07 DIAGNOSIS — E785 Hyperlipidemia, unspecified: Secondary | ICD-10-CM

## 2014-06-07 DIAGNOSIS — I10 Essential (primary) hypertension: Secondary | ICD-10-CM

## 2014-06-07 DIAGNOSIS — N529 Male erectile dysfunction, unspecified: Secondary | ICD-10-CM

## 2014-06-07 NOTE — Progress Notes (Signed)
  Subjective:    Patient ID: Dean Mcconnell, male    DOB: Aug 16, 1945, 68 y.o.   MRN: [id number]  Patient here today for follow of chronic medical problems- No complaints today. Patient says that he has completely changed his diet and has been exercising daily- says that he feels much better and energy level has improved.  Hypertension This is a chronic problem. The current episode started more than 1 year ago. The problem is controlled. Pertinent negatives include no chest pain, headaches or shortness of breath. Risk factors for coronary artery disease include dyslipidemia and male gender. Past treatments include angiotensin blockers and diuretics. The current treatment provides significant improvement. Compliance problems include diet and exercise.   Hyperlipidemia This is a chronic problem. The current episode started more than 1 year ago. The problem is controlled. Recent lipid tests were reviewed and are normal. He has no history of diabetes, hypothyroidism or obesity. Pertinent negatives include no chest pain or shortness of breath. Current antihyperlipidemic treatment includes statins. The current treatment provides moderate improvement of lipids. Compliance problems include adherence to diet and adherence to exercise.  Risk factors for coronary artery disease include dyslipidemia, hypertension and male sex.  erectile dysfunction Uses viagra when he can afford to get.   Review of Systems  Constitutional: Negative.   HENT: Negative.   Eyes: Negative.   Respiratory: Negative for shortness of breath.   Cardiovascular: Negative for chest pain.  Genitourinary: Negative.   Neurological: Negative for headaches.  All other systems reviewed and are negative.      Objective:   Physical Exam  Constitutional: He is oriented to person, place, and time. He appears well-developed and well-nourished.  HENT:  Head: Normocephalic.  Right Ear: External ear normal.  Left Ear: External ear normal.   Nose: Nose normal.  Mouth/Throat: Oropharynx is clear and moist.  Eyes: EOM are normal. Pupils are equal, round, and reactive to light.  Neck: Normal range of motion. Neck supple. No JVD present. No thyromegaly present.  Cardiovascular: Normal rate, regular rhythm, normal heart sounds and intact distal pulses.  Exam reveals no gallop and no friction rub.   No murmur heard. Pulmonary/Chest: Effort normal and breath sounds normal. No respiratory distress. He has no wheezes. He has no rales. He exhibits no tenderness.  Abdominal: Soft. Bowel sounds are normal. He exhibits no mass. There is no tenderness.  Genitourinary:  Refuses rectal exam  Musculoskeletal: Normal range of motion. He exhibits no edema.  Lymphadenopathy:    He has no cervical adenopathy.  Neurological: He is alert and oriented to person, place, and time. No cranial nerve deficit.  Skin: Skin is warm and dry.  Psychiatric: He has a normal mood and affect. His behavior is normal. Judgment and thought content normal.  BP 126/70 mmHg  Pulse 75  Temp(Src) 97.7 F (36.5 C) (Oral)  Ht _0  (1.778 m)  Wt 207 lb 6.4 oz (94.076 kg)  BMI 29.76 kg/m2           Assessment & Plan:     1. Essential hypertension Low NA+ diet - CMP14+EGFR  2. Erectile dysfunction, unspecified erectile dysfunction type - PSA, total and free  3. Hyperlipidemia Low fat diet and exercise - NMR, lipoprofile    Labs pending Health maintenance reviewed Diet and exercise encouraged Continue all meds Follow up  In 3 month   [place], FNP

## 2014-06-07 NOTE — Patient Instructions (Addendum)

## 2014-06-08 LAB — CMP14+EGFR
ALK PHOS: 102 IU/L (ref 39–117)
ALT: 12 IU/L (ref 0–44)
AST: 13 IU/L (ref 0–40)
Albumin/Globulin Ratio: 1.7 (ref 1.1–2.5)
Albumin: 4.3 g/dL (ref 3.6–4.8)
BUN/Creatinine Ratio: 10 (ref 10–22)
BUN: 19 mg/dL (ref 8–27)
CO2: 26 mmol/L (ref 18–29)
Calcium: 9.5 mg/dL (ref 8.6–10.2)
Chloride: 101 mmol/L (ref 97–108)
Creatinine, Ser: 1.81 mg/dL — ABNORMAL HIGH (ref 0.76–1.27)
GFR calc Af Amer: 44 mL/min/{1.73_m2} — ABNORMAL LOW (ref >59)
GFR, EST NON AFRICAN AMERICAN: 38 mL/min/{1.73_m2} — AB (ref >59)
GLOBULIN, TOTAL: 2.5 g/dL (ref 1.5–4.5)
Glucose: 106 mg/dL — ABNORMAL HIGH (ref 65–99)
Potassium: 4.1 mmol/L (ref 3.5–5.2)
SODIUM: 142 mmol/L (ref 134–144)
Total Bilirubin: 0.6 mg/dL (ref 0.0–1.2)
Total Protein: 6.8 g/dL (ref 6.0–8.5)

## 2014-06-08 LAB — PSA, TOTAL AND FREE
PSA FREE PCT: 26.7 %
PSA FREE: 0.16 ng/mL
PSA: 0.6 ng/mL (ref 0.0–4.0)

## 2014-06-08 LAB — NMR, LIPOPROFILE
Cholesterol: 116 mg/dL (ref 100–199)
HDL Cholesterol by NMR: 46 mg/dL (ref >39)
HDL PARTICLE NUMBER: 29.2 umol/L — AB (ref >=30.5)
LDL Particle Number: 874 nmol/L (ref <1000)
LDL Size: 20.4 nm (ref >20.5)
LDL-C: 56 mg/dL (ref 0–99)
LP-IR SCORE: 43 (ref <=45)
SMALL LDL PARTICLE NUMBER: 394 nmol/L (ref <=527)
Triglycerides by NMR: 68 mg/dL (ref 0–149)

## 2014-06-11 ENCOUNTER — Telehealth: Payer: Self-pay | Admitting: Nurse Practitioner

## 2014-06-11 NOTE — Telephone Encounter (Signed)
-----   Message from [facility], [place] sent at 06/11/2014  8:21 AM EST ----- Kidney and liver function stable Creatine up some- avoid NSAIDS Cholesterol looks great PSA- normal Continue current meds- low fat diet and exercise and recheck in 3 months

## 2014-06-12 NOTE — Telephone Encounter (Signed)
Details of lab results left on vm.

## 2014-08-01 LAB — HM DIABETES EYE EXAM

## 2014-08-09 DIAGNOSIS — C44329 Squamous cell carcinoma of skin of other parts of face: Secondary | ICD-10-CM | POA: Diagnosis not present

## 2014-08-09 DIAGNOSIS — D225 Melanocytic nevi of trunk: Secondary | ICD-10-CM | POA: Diagnosis not present

## 2014-08-09 DIAGNOSIS — C44320 Squamous cell carcinoma of skin of unspecified parts of face: Secondary | ICD-10-CM | POA: Diagnosis not present

## 2014-08-09 DIAGNOSIS — D485 Neoplasm of uncertain behavior of skin: Secondary | ICD-10-CM | POA: Diagnosis not present

## 2014-08-23 DIAGNOSIS — D485 Neoplasm of uncertain behavior of skin: Secondary | ICD-10-CM | POA: Diagnosis not present

## 2014-08-23 DIAGNOSIS — L98429 Non-pressure chronic ulcer of back with unspecified severity: Secondary | ICD-10-CM | POA: Diagnosis not present

## 2014-09-10 ENCOUNTER — Ambulatory Visit (INDEPENDENT_AMBULATORY_CARE_PROVIDER_SITE_OTHER): Payer: Medicare Other | Admitting: Nurse Practitioner

## 2014-09-10 ENCOUNTER — Encounter: Payer: Self-pay | Admitting: Nurse Practitioner

## 2014-09-10 VITALS — BP 132/84 | HR 73 | Temp 96.7°F | Ht 70.0 in | Wt 211.0 lb

## 2014-09-10 DIAGNOSIS — E785 Hyperlipidemia, unspecified: Secondary | ICD-10-CM | POA: Diagnosis not present

## 2014-09-10 DIAGNOSIS — N529 Male erectile dysfunction, unspecified: Secondary | ICD-10-CM

## 2014-09-10 DIAGNOSIS — I10 Essential (primary) hypertension: Secondary | ICD-10-CM

## 2014-09-10 MED ORDER — ATORVASTATIN CALCIUM 40 MG PO TABS: 40.0000 mg | ORAL_TABLET | Freq: Every day | ORAL | Status: DC

## 2014-09-10 MED ORDER — HYDROCHLOROTHIAZIDE 25 MG PO TABS: 25.0000 mg | ORAL_TABLET | Freq: Every day | ORAL | Status: DC

## 2014-09-10 MED ORDER — LOSARTAN POTASSIUM 100 MG PO TABS: 100.0000 mg | ORAL_TABLET | Freq: Every day | ORAL | Status: DC

## 2014-09-10 MED ORDER — TADALAFIL 20 MG PO TABS: 20.0000 mg | ORAL_TABLET | ORAL | Status: DC | PRN

## 2014-09-10 NOTE — Patient Instructions (Signed)

## 2014-09-10 NOTE — Progress Notes (Signed)
Subjective:    Patient ID: Dean Mcconnell, male    DOB: Dec 28, 1945, 69 y.o.   MRN: [id number]  Patient here today for follow of chronic medical problems- No complaints today. STill exercising 2-3 x a week- trying to watch diet.   Hypertension This is a chronic problem. The current episode started more than 1 year ago. The problem is controlled. Pertinent negatives include no chest pain, headaches or shortness of breath. Risk factors for coronary artery disease include dyslipidemia and male gender. Past treatments include angiotensin blockers and diuretics. The current treatment provides significant improvement. Compliance problems include diet and exercise.   Hyperlipidemia This is a chronic problem. The current episode started more than 1 year ago. The problem is controlled. Recent lipid tests were reviewed and are normal. He has no history of diabetes, hypothyroidism or obesity. Pertinent negatives include no chest pain or shortness of breath. Current antihyperlipidemic treatment includes statins. The current treatment provides moderate improvement of lipids. Compliance problems include adherence to diet and adherence to exercise.  Risk factors for coronary artery disease include dyslipidemia, hypertension and male sex.  erectile dysfunction Uses viagra when he can afford to get.   Review of Systems  Constitutional: Negative.   HENT: Negative.   Eyes: Negative.   Respiratory: Negative for shortness of breath.   Cardiovascular: Negative for chest pain.  Genitourinary: Negative.   Neurological: Negative for headaches.  All other systems reviewed and are negative.      Objective:   Physical Exam  Constitutional: He is oriented to person, place, and time. He appears well-developed and well-nourished.  HENT:  Head: Normocephalic.  Right Ear: External ear normal.  Left Ear: External ear normal.  Nose: Nose normal.  Mouth/Throat: Oropharynx is clear and moist.  Eyes: EOM are normal.  Pupils are equal, round, and reactive to light.  Neck: Normal range of motion. Neck supple. No JVD present. No thyromegaly present.  Cardiovascular: Normal rate, regular rhythm, normal heart sounds and intact distal pulses.  Exam reveals no gallop and no friction rub.   No murmur heard. Pulmonary/Chest: Effort normal and breath sounds normal. No respiratory distress. He has no wheezes. He has no rales. He exhibits no tenderness.  Abdominal: Soft. Bowel sounds are normal. He exhibits no mass. There is no tenderness.  Genitourinary:  Refuses rectal exam  Musculoskeletal: Normal range of motion. He exhibits no edema.  Lymphadenopathy:    He has no cervical adenopathy.  Neurological: He is alert and oriented to person, place, and time. No cranial nerve deficit.  Skin: Skin is warm and dry.  Psychiatric: He has a normal mood and affect. His behavior is normal. Judgment and thought content normal.    BP 132/84 mmHg  Pulse 73  Temp(Src) 96.7 F (35.9 C) (Oral)  Ht 5' 10" (1.778 m)  Wt 211 lb (95.709 kg)  BMI 30.28 kg/m2          Assessment & Plan:   1. Essential hypertension Do not add salt to diet - CMP14+EGFR - hydrochlorothiazide (HYDRODIURIL) 25 MG tablet; Take 1 tablet (25 mg total) by mouth daily.  Dispense: 93 tablet; Refill: 1 - losartan (COZAAR) 100 MG tablet; Take 1 tablet (100 mg total) by mouth daily.  Dispense: 93 tablet; Refill: 1  2. Hyperlipidemia Low fat diet - NMR, lipoprofile - atorvastatin (LIPITOR) 40 MG tablet; Take 1 tablet (40 mg total) by mouth daily.  Dispense: 93 tablet; Refill: 1  3. Erectile dysfunction, unspecified erectile dysfunction type - tadalafil (CIALIS)  20 MG tablet; Take 1 tablet (20 mg total) by mouth every other day as needed for erectile dysfunction.  Dispense: 5 tablet; Refill: 11   hemoccult cards given to patient with directions Labs pending Health maintenance reviewed- refuses adult immunizations Diet and exercise  encouraged Continue all meds Follow up  In 3 month   [place], FNP

## 2014-09-11 LAB — NMR, LIPOPROFILE
Cholesterol: 122 mg/dL (ref 100–199)
HDL CHOLESTEROL BY NMR: 48 mg/dL (ref >39)
HDL Particle Number: 32.6 umol/L (ref >=30.5)
LDL PARTICLE NUMBER: 628 nmol/L (ref <1000)
LDL Size: 20.4 nm (ref >20.5)
LDL-C: 60 mg/dL (ref 0–99)
LP-IR Score: 36 (ref <=45)
Small LDL Particle Number: 419 nmol/L (ref <=527)
TRIGLYCERIDES BY NMR: 69 mg/dL (ref 0–149)

## 2014-09-11 LAB — CMP14+EGFR
A/G RATIO: 1.9 (ref 1.1–2.5)
ALBUMIN: 4.1 g/dL (ref 3.6–4.8)
ALK PHOS: 88 IU/L (ref 39–117)
ALT: 11 IU/L (ref 0–44)
AST: 12 IU/L (ref 0–40)
BILIRUBIN TOTAL: 0.4 mg/dL (ref 0.0–1.2)
BUN/Creatinine Ratio: 12 (ref 10–22)
BUN: 18 mg/dL (ref 8–27)
CO2: 28 mmol/L (ref 18–29)
CREATININE: 1.53 mg/dL — AB (ref 0.76–1.27)
Calcium: 9.2 mg/dL (ref 8.6–10.2)
Chloride: 101 mmol/L (ref 97–108)
GFR, EST AFRICAN AMERICAN: 54 mL/min/{1.73_m2} — AB (ref >59)
GFR, EST NON AFRICAN AMERICAN: 46 mL/min/{1.73_m2} — AB (ref >59)
GLOBULIN, TOTAL: 2.2 g/dL (ref 1.5–4.5)
GLUCOSE: 91 mg/dL (ref 65–99)
Potassium: 4.3 mmol/L (ref 3.5–5.2)
Sodium: 141 mmol/L (ref 134–144)
Total Protein: 6.3 g/dL (ref 6.0–8.5)

## 2014-11-08 ENCOUNTER — Telehealth: Payer: Self-pay | Admitting: Nurse Practitioner

## 2014-11-29 ENCOUNTER — Ambulatory Visit (INDEPENDENT_AMBULATORY_CARE_PROVIDER_SITE_OTHER): Payer: Medicare Other | Admitting: *Deleted

## 2014-11-29 ENCOUNTER — Encounter: Payer: Self-pay | Admitting: *Deleted

## 2014-11-29 VITALS — BP 141/81 | HR 73 | Ht 70.25 in | Wt 209.0 lb

## 2014-11-29 DIAGNOSIS — Z0000 Encounter for general adult medical examination without abnormal findings: Secondary | ICD-10-CM | POA: Diagnosis not present

## 2014-11-29 NOTE — Progress Notes (Signed)
Patient ID: Dean Mcconnell, male   DOB: 06-25-1946, 69 y.o.   MRN: [id number]   Subjective:   Dean Mcconnell is a 69 y.o. male who presents for an Initial Medicare Annual Wellness Visit. 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 His wife of 15 years passed away 3 years ago. He has had some depression since then. However this is improving. He has a girlfriend who keeps him active and cooks for him. He is still sad at times over the loss of his wife but has hope for the future.   Review of Systems   Cardiac Risk Factors include: advanced age (>20men, >48 women);dyslipidemia;hypertension;male gender;obesity (BMI >30kg/m2);smoking/ tobacco exposure Other systems negative.    Objective:    Today's Vitals   11/29/14 1104  BP: 141/81  Pulse: 73  Height: 5' 10.25" (1.784 m)  Weight: 209 lb (94.802 kg)  BMI     29  Current Medications (verified) Outpatient Encounter Prescriptions as of 11/29/2014  Medication Sig  . atorvastatin (LIPITOR) 40 MG tablet Take 1 tablet (40 mg total) by mouth daily.  . hydrochlorothiazide (HYDRODIURIL) 25 MG tablet Take 1 tablet (25 mg total) by mouth daily.  Marland Kitchen losartan (COZAAR) 100 MG tablet Take 1 tablet (100 mg total) by mouth daily.  . tadalafil (CIALIS) 20 MG tablet Take 1 tablet (20 mg total) by mouth every other day as needed for erectile dysfunction. (Patient not taking: Reported on 11/29/2014)    Allergies (verified) Penicillins -swelling  History: Past Medical History  Diagnosis Date  . Hyperlipidemia   . Hypertension   . Cancer     melanoma on nose   Past Surgical History  Procedure Laterality Date  . Skin cancer excision      nose   Family History  Problem Relation Age of Onset  . Healthy Mother   . Hypertension Father    History   Social History  . Marital Status: Widowed    Spouse Name: N/A  . Number of Children: N/A  . Years of Education: N/A   Occupational History  . Retired      Biomedical scientist   Social History Main Topics  . Smoking status: Light Tobacco Smoker -- 0.25 packs/day for 30 years    Types: Cigarettes  . Smokeless tobacco: Former Systems developer    Types: Chew     Comment: Patient states that he no longer inhales  . Alcohol Use: No  . Drug Use: No  . Sexual Activity: Yes   Other Topics Concern  . Not on file   Social History Narrative   Tobacco Counseling Ready to quit: No Counseling given: Yes   Activities of Daily Living In your present state of health, do you have any difficulty performing the following activities: 11/29/2014 09/10/2014  Hearing? Y N  Vision? N N  Difficulty concentrating or making decisions? N N  Walking or climbing stairs? N N  Dressing or bathing? N N  Doing errands, shopping? N N  Preparing Food and eating ? N -  Using the Toilet? N -  In the past six months, have you accidently leaked urine? N -  Do you have problems with loss of bowel control? N -  Managing your Medications? N -  Managing your Finances? N -  Housekeeping or managing your Housekeeping? N -   Has high frequency hearing loss. Last evaluated around 1 year ago. He has some difficulty understanding people if  they talk to softly. He does not have hearing aids.   Immunizations and Health Maintenance Immunization History  Administered Date(s) Administered  . Td 09/04/2009   Health Maintenance Due  Topic Date Due  . COLON CANCER SCREENING ANNUAL FOBT  01/23/1997    Patient Care Team: Chevis Pretty, FNP as PCP - General (Nurse Practitioner) Adelina Mings Margret Chance, MD as Referring Physician (Optometry)      Assessment:   This is a routine wellness examination for Dean Mcconnell.   Hearing/Vision screen No hearing or vision deficits noted during visit today.   Dietary issues and exercise activities discussed: Current Exercise Habits:: Home exercise routine (Uses some home exercise equipment 3-4 times per week), Type of exercise: strength  training/weights, Time (Minutes): 15, Frequency (Times/Week): 4, Weekly Exercise (Minutes/Week): 60, Intensity: Moderate  Goals    . Exercise 3x per week (30 min per time)      Depression Screen PHQ 2/9 Scores 11/29/2014 09/10/2014 06/07/2014 01/19/2014  PHQ - 2 Score 2 2 0 0  PHQ- 9 Score 3 2 - -  Patient's wife of 15 years passed away 3 years ago. Sometimes he finds it difficult to fall asleep because he's thinking about her. He is improving. He is motivated and has hope for the future. These episodes of sadness do not prevent him from doing his normal daily activities and he is able to bounce back from them quicker these days.     Fall Risk Fall Risk  11/29/2014 09/10/2014 06/07/2014 01/19/2014 10/11/2013  Falls in the past year? No No No No No    Cognitive Function: MMSE - Mini Mental State Exam 11/29/2014  Orientation to time 5  Orientation to Place 5  Registration 3  Attention/ Calculation 5  Recall 3  Language- name 2 objects 2  Language- repeat 1  Language- follow 3 step command 3  Language- read & follow direction 1  Write a sentence 1  Copy design 1  Total score 30  No deficit  Screening Tests Health Maintenance  Topic Date Due  . COLON CANCER SCREENING ANNUAL FOBT  01/23/1997  . COLONOSCOPY  03/11/2015 (Originally 01/23/1997)  . ZOSTAVAX  03/11/2015 (Originally 01/24/2007)  . PNA vac Low Risk Adult (1 of 2 - PCV13) 11/29/2015 (Originally 01/24/2012)  . INFLUENZA VACCINE  03/04/2015  . TETANUS/TDAP  09/05/2019        Plan:  Return FOBT Keep follow up appointments I requested office note from eye exam done 08/2014. Review Advance Directives with family and if completed provide a copy to our office  During the course of the visit [place] was educated and counseled about the following appropriate screening and preventive services:   Vaccines to include Pneumoccal-declined, Influenza-declined, Hepatitis B-declined, Td-Up to date, Zostavax-declined  Electrocardiogram-done  10/11/13  Colorectal cancer screening-Declined colonoscopy but will return FOBT  Cardiovascular disease screening-Lipids screened at routine office visit  Diabetes screening-glucose monitored at routine office visit  Glaucoma screening-Screened 08/2014  Nutrition counseling-Dash diet  Prostate cancer screening-PSA 06/07/14. Refused rectal exam.  Smoking cessation counseling-Discussed and handout given  Patient Instructions (the written plan) were given to the patient.   Chong Sicilian, RN   11/29/2014       I have reviewed and agree with the above AWV documentation.  Claretta Fraise, M.D.

## 2014-11-29 NOTE — Patient Instructions (Addendum)
Walk for 30 minutes at least 3 days per week in addition to regular activities.  Return FOBT.    Health Maintenance A healthy lifestyle and preventative care can promote health and wellness.  Maintain regular health, dental, and eye exams.  Eat a healthy diet. Foods like vegetables, fruits, whole grains, low-fat dairy products, and lean protein foods contain the nutrients you need and are low in calories. Decrease your intake of foods high in solid fats, added sugars, and salt. Get information about a proper diet from your health care provider, if necessary.  Regular physical exercise is one of the most important things you can do for your health. Most adults should get at least 150 minutes of moderate-intensity exercise (any activity that increases your heart rate and causes you to sweat) each week. In addition, most adults need muscle-strengthening exercises on 2 or more days a week.   Maintain a healthy weight. The body mass index (BMI) is a screening tool to identify possible weight problems. It provides an estimate of body fat based on height and weight. Your health care provider can find your BMI and can help you achieve or maintain a healthy weight. For males 20 years and older:  A BMI below 18.5 is considered underweight.  A BMI of 18.5 to 24.9 is normal.  A BMI of 25 to 29.9 is considered overweight.  A BMI of 30 and above is considered obese.  Maintain normal blood lipids and cholesterol by exercising and minimizing your intake of saturated fat. Eat a balanced diet with plenty of fruits and vegetables. Blood tests for lipids and cholesterol should begin at age 41 and be repeated every 5 years. If your lipid or cholesterol levels are high, you are over age 1, or you are at high risk for heart disease, you may need your cholesterol levels checked more frequently.Ongoing high lipid and cholesterol levels should be treated with medicines if diet and exercise are not working.  If  you smoke, find out from your health care provider how to quit. If you do not use tobacco, do not start.  Lung cancer screening is recommended for adults aged 37-80 years who are at high risk for developing lung cancer because of a history of smoking. A yearly low-dose CT scan of the lungs is recommended for people who have at least a 30-pack-year history of smoking and are current smokers or have quit within the past 15 years. A pack year of smoking is smoking an average of 1 pack of cigarettes a day for 1 year (for example, a 30-pack-year history of smoking could mean smoking 1 pack a day for 30 years or 2 packs a day for 15 years). Yearly screening should continue until the smoker has stopped smoking for at least 15 years. Yearly screening should be stopped for people who develop a health problem that would prevent them from having lung cancer treatment.  If you choose to drink alcohol, do not have more than 2 drinks per day. One drink is considered to be 12 oz (360 mL) of beer, 5 oz (150 mL) of wine, or 1.5 oz (45 mL) of liquor.  Avoid the use of street drugs. Do not share needles with anyone. Ask for help if you need support or instructions about stopping the use of drugs.  High blood pressure causes heart disease and increases the risk of stroke. Blood pressure should be checked at least every 1-2 years. Ongoing high blood pressure should be treated with medicines  if weight loss and exercise are not effective.  If you are 38-94 years old, ask your health care provider if you should take aspirin to prevent heart disease.  Diabetes screening involves taking a blood sample to check your fasting blood sugar level. This should be done once every 3 years after age 68 if you are at a normal weight and without risk factors for diabetes. Testing should be considered at a younger age or be carried out more frequently if you are overweight and have at least 1 risk factor for diabetes.  Colorectal cancer can  be detected and often prevented. Most routine colorectal cancer screening begins at the age of 57 and continues through age 67. However, your health care provider may recommend screening at an earlier age if you have risk factors for colon cancer. On a yearly basis, your health care provider may provide home test kits to check for hidden blood in the stool. A small camera at the end of a tube may be used to directly examine the colon (sigmoidoscopy or colonoscopy) to detect the earliest forms of colorectal cancer. Talk to your health care provider about this at age 72 when routine screening begins. A direct exam of the colon should be repeated every 5-10 years through age 54, unless early forms of precancerous polyps or small growths are found.  People who are at an increased risk for hepatitis B should be screened for this virus. You are considered at high risk for hepatitis B if:  You were born in a country where hepatitis B occurs often. Talk with your health care provider about which countries are considered high risk.  Your parents were born in a high-risk country and you have not received a shot to protect against hepatitis B (hepatitis B vaccine).  You have HIV or AIDS.  You use needles to inject street drugs.  You live with, or have sex with, someone who has hepatitis B.  You are a man who has sex with other men (MSM).  You get hemodialysis treatment.  You take certain medicines for conditions like cancer, organ transplantation, and autoimmune conditions.  Hepatitis C blood testing is recommended for all people born from [birth year] through 1964 and any individual with known risk factors for hepatitis C.  Healthy men should no longer receive prostate-specific antigen (PSA) blood tests as part of routine cancer screening. Talk to your health care provider about prostate cancer screening.  Testicular cancer screening is not recommended for adolescents or adult males who have no symptoms.  Screening includes self-exam, a health care provider exam, and other screening tests. Consult with your health care provider about any symptoms you have or any concerns you have about testicular cancer.  Practice safe sex. Use condoms and avoid high-risk sexual practices to reduce the spread of sexually transmitted infections (STIs).  You should be screened for STIs, including gonorrhea and chlamydia if:  You are sexually active and are younger than 24 years.  You are older than 24 years, and your health care provider tells you that you are at risk for this type of infection.  Your sexual activity has changed since you were last screened, and you are at an increased risk for chlamydia or gonorrhea. Ask your health care provider if you are at risk.  If you are at risk of being infected with HIV, it is recommended that you take a prescription medicine daily to prevent HIV infection. This is called pre-exposure prophylaxis (PrEP). You are considered at  risk if:  You are a man who has sex with other men (MSM).  You are a heterosexual man who is sexually active with multiple partners.  You take drugs by injection.  You are sexually active with a partner who has HIV.  Talk with your health care provider about whether you are at high risk of being infected with HIV. If you choose to begin PrEP, you should first be tested for HIV. You should then be tested every 3 months for as long as you are taking PrEP.  Use sunscreen. Apply sunscreen liberally and repeatedly throughout the day. You should seek shade when your shadow is shorter than you. Protect yourself by wearing long sleeves, pants, a wide-brimmed hat, and sunglasses year round whenever you are outdoors.  Tell your health care provider of new moles or changes in moles, especially if there is a change in shape or color. Also, tell your health care provider if a mole is larger than the size of a pencil eraser.  A one-time screening for  abdominal aortic aneurysm (AAA) and surgical repair of large AAAs by ultrasound is recommended for men aged 59-75 years who are current or former smokers.  Stay current with your vaccines (immunizations). Document Released: 01/16/2008 Document Revised: 07/25/2013 Document Reviewed: 12/15/2010 [facility] Patient Information 2015 [place], [place]. This information is not intended to replace advice given to you by your health care provider. Make sure you discuss any questions you have with your health care provider.  Smoking Cessation Quitting smoking is important to your health and has many advantages. However, it is not always easy to quit since nicotine is a very addictive drug. Oftentimes, people try 3 times or more before being able to quit. This document explains the best ways for you to prepare to quit smoking. Quitting takes hard work and a lot of effort, but you can do it. ADVANTAGES OF QUITTING SMOKING  You will live longer, feel better, and live better.  Your body will feel the impact of quitting smoking almost immediately.  Within 20 minutes, blood pressure decreases. Your pulse returns to its normal level.  After 8 hours, carbon monoxide levels in the blood return to normal. Your oxygen level increases.  After 24 hours, the chance of having a heart attack starts to decrease. Your breath, hair, and body stop smelling like smoke.  After 48 hours, damaged nerve endings begin to recover. Your sense of taste and smell improve.  After 72 hours, the body is virtually free of nicotine. Your bronchial tubes relax and breathing becomes easier.  After 2 to 12 weeks, lungs can hold more air. Exercise becomes easier and circulation improves.  The risk of having a heart attack, stroke, cancer, or lung disease is greatly reduced.  After 1 year, the risk of coronary heart disease is cut in half.  After 5 years, the risk of stroke falls to the same as a nonsmoker.  After 10 years, the risk of  lung cancer is cut in half and the risk of other cancers decreases significantly.  After 15 years, the risk of coronary heart disease drops, usually to the level of a nonsmoker.  If you are pregnant, quitting smoking will improve your chances of having a healthy baby.  The people you live with, especially any children, will be healthier.  You will have extra money to spend on things other than cigarettes. QUESTIONS TO THINK ABOUT BEFORE ATTEMPTING TO QUIT You may want to talk about your answers with your  health care provider.  Why do you want to quit?  If you tried to quit in the past, what helped and what did not?  What will be the most difficult situations for you after you quit? How will you plan to handle them?  Who can help you through the tough times? Your family? Friends? A health care provider?  What pleasures do you get from smoking? What ways can you still get pleasure if you quit? Here are some questions to ask your health care provider:  How can you help me to be successful at quitting?  What medicine do you think would be best for me and how should I take it?  What should I do if I need more help?  What is smoking withdrawal like? How can I get information on withdrawal? GET READY  Set a quit date.  Change your environment by getting rid of all cigarettes, ashtrays, matches, and lighters in your home, car, or work. Do not let people smoke in your home.  Review your past attempts to quit. Think about what worked and what did not. GET SUPPORT AND ENCOURAGEMENT You have a better chance of being successful if you have help. You can get support in many ways.  Tell your family, friends, and coworkers that you are going to quit and need their support. Ask them not to smoke around you.  Get individual, group, or telephone counseling and support. Programs are available at [employer] and health centers. Call your local health department for information about programs  in your area.  Spiritual beliefs and practices may help some smokers quit.  Download a "quit meter" on your computer to keep track of quit statistics, such as how long you have gone without smoking, cigarettes not smoked, and money saved.  Get a self-help book about quitting smoking and staying off tobacco. [place] yourself from urges to smoke. Talk to someone, go for a walk, or occupy your time with a task.  Change your normal routine. Take a different route to work. Drink tea instead of coffee. Eat breakfast in a different place.  Reduce your stress. Take a hot bath, exercise, or read a book.  Plan something enjoyable to do every day. Reward yourself for not smoking.  Explore interactive web-based programs that specialize in helping you quit. GET MEDICINE AND USE IT CORRECTLY Medicines can help you stop smoking and decrease the urge to smoke. Combining medicine with the above behavioral methods and support can greatly increase your chances of successfully quitting smoking.  Nicotine replacement therapy helps deliver nicotine to your body without the negative effects and risks of smoking. Nicotine replacement therapy includes nicotine gum, lozenges, inhalers, nasal sprays, and skin patches. Some may be available over-the-counter and others require a prescription.  Antidepressant medicine helps people abstain from smoking, but how this works is unknown. This medicine is available by prescription.  Nicotinic receptor partial agonist medicine simulates the effect of nicotine in your brain. This medicine is available by prescription. Ask your health care provider for advice about which medicines to use and how to use them based on your health history. Your health care provider will tell you what side effects to look out for if you choose to be on a medicine or therapy. Carefully read the information on the package. Do not use any other product containing  nicotine while using a nicotine replacement product.  RELAPSE OR DIFFICULT SITUATIONS Most relapses occur within the first  3 months after quitting. Do not be discouraged if you start smoking again. Remember, most people try several times before finally quitting. You may have symptoms of withdrawal because your body is used to nicotine. You may crave cigarettes, be irritable, feel very hungry, cough often, get headaches, or have difficulty concentrating. The withdrawal symptoms are only temporary. They are strongest when you first quit, but they will go away within 10-14 days. To reduce the chances of relapse, try to:  Avoid drinking alcohol. Drinking lowers your chances of successfully quitting.  Reduce the amount of caffeine you consume. Once you quit smoking, the amount of caffeine in your body increases and can give you symptoms, such as a rapid heartbeat, sweating, and anxiety.  Avoid smokers because they can make you want to smoke.  Do not let weight gain distract you. Many smokers will gain weight when they quit, usually less than 10 pounds. Eat a healthy diet and stay active. You can always lose the weight gained after you quit.  Find ways to improve your mood other than smoking. FOR MORE INFORMATION  www.smokefree.gov  Document Released: 07/14/2001 Document Revised: 12/04/2013 Document Reviewed: 10/29/2011 [facility] Patient Information 2015 [place], [place]. This information is not intended to replace advice given to you by your health care provider. Make sure you discuss any questions you have with your health care provider.  DASH Eating Plan DASH stands for "Dietary Approaches to Stop Hypertension." The DASH eating plan is a healthy eating plan that has been shown to reduce high blood pressure (hypertension). Additional health benefits may include reducing the risk of type 2 diabetes mellitus, heart disease, and stroke. The DASH eating plan may also help with weight loss. WHAT DO I NEED  TO KNOW ABOUT THE DASH EATING PLAN? For the DASH eating plan, you will follow these general guidelines:  Choose foods with a percent daily value for sodium of less than 5% (as listed on the food label).  Use salt-free seasonings or herbs instead of table salt or sea salt.  Check with your health care provider or pharmacist before using salt substitutes.  Eat lower-sodium products, often labeled as "lower sodium" or "no salt added."  Eat fresh foods.  Eat more vegetables, fruits, and low-fat dairy products.  Choose whole grains. Look for the word "whole" as the first word in the ingredient list.  Choose fish and skinless chicken or [place] more often than red meat. Limit fish, poultry, and meat to 6 oz (170 g) each day.  Limit sweets, desserts, sugars, and sugary drinks.  Choose heart-healthy fats.  Limit cheese to 1 oz (28 g) per day.  Eat more home-cooked food and less restaurant, buffet, and fast food.  Limit fried foods.  Cook foods using methods other than frying.  Limit canned vegetables. If you do use them, rinse them well to decrease the sodium.  When eating at a restaurant, ask that your food be prepared with less salt, or no salt if possible. WHAT FOODS CAN I EAT? Seek help from a dietitian for individual calorie needs. Grains Whole grain or whole wheat bread. Brown rice. Whole grain or whole wheat pasta. Quinoa, bulgur, and whole grain cereals. Low-sodium cereals. Corn or whole wheat flour tortillas. Whole grain cornbread. Whole grain crackers. Low-sodium crackers. Vegetables Fresh or frozen vegetables (raw, steamed, roasted, or grilled). Low-sodium or reduced-sodium tomato and vegetable juices. Low-sodium or reduced-sodium tomato sauce and paste. Low-sodium or reduced-sodium canned vegetables.  Fruits All fresh, canned (in natural juice), or  frozen fruits. Meat and Other Protein Products Ground beef (85% or leaner), grass-fed beef, or beef trimmed of fat. Skinless  chicken or [place]. Ground chicken or [place]. Pork trimmed of fat. All fish and seafood. Eggs. Dried beans, peas, or lentils. Unsalted nuts and seeds. Unsalted canned beans. Dairy Low-fat dairy products, such as skim or 1% milk, 2% or reduced-fat cheeses, low-fat ricotta or cottage cheese, or plain low-fat yogurt. Low-sodium or reduced-sodium cheeses. Fats and Oils Tub margarines without trans fats. Light or reduced-fat mayonnaise and salad dressings (reduced sodium). Avocado. Safflower, olive, or canola oils. Natural peanut or almond butter. Other Unsalted popcorn and pretzels. The items listed above may not be a complete list of recommended foods or beverages. Contact your dietitian for more options. WHAT FOODS ARE NOT RECOMMENDED? Grains White bread. White pasta. White rice. Refined cornbread. Bagels and croissants. Crackers that contain trans fat. Vegetables Creamed or fried vegetables. Vegetables in a cheese sauce. Regular canned vegetables. Regular canned tomato sauce and paste. Regular tomato and vegetable juices. Fruits Dried fruits. Canned fruit in light or heavy syrup. Fruit juice. Meat and Other Protein Products Fatty cuts of meat. Ribs, chicken wings, bacon, sausage, bologna, salami, chitterlings, fatback, hot dogs, bratwurst, and packaged luncheon meats. Salted nuts and seeds. Canned beans with salt. Dairy Whole or 2% milk, cream, half-and-half, and cream cheese. Whole-fat or sweetened yogurt. Full-fat cheeses or blue cheese. Nondairy creamers and whipped toppings. Processed cheese, cheese spreads, or cheese curds. Condiments Onion and garlic salt, seasoned salt, table salt, and sea salt. Canned and packaged gravies. Worcestershire sauce. Tartar sauce. Barbecue sauce. Teriyaki sauce. Soy sauce, including reduced sodium. Steak sauce. Fish sauce. Oyster sauce. Cocktail sauce. Horseradish. Ketchup and mustard. Meat flavorings and tenderizers. Bouillon cubes. Hot sauce. Tabasco sauce.  Marinades. Taco seasonings. Relishes. Fats and Oils Butter, stick margarine, lard, shortening, ghee, and bacon fat. Coconut, palm kernel, or palm oils. Regular salad dressings. Other Pickles and olives. Salted popcorn and pretzels. The items listed above may not be a complete list of foods and beverages to avoid. Contact your dietitian for more information. WHERE CAN I FIND MORE INFORMATION? National Heart, Lung, and Blood Institute: travelstabloid.com Document Released: 07/09/2011 Document Revised: 12/04/2013 Document Reviewed: 05/24/2013 [facility] Patient Information 2015 [place], [place]. This information is not intended to replace advice given to you by your health care provider. Make sure you discuss any questions you have with your health care provider.

## 2014-12-10 ENCOUNTER — Ambulatory Visit: Payer: Medicare Other | Admitting: Nurse Practitioner

## 2014-12-13 ENCOUNTER — Ambulatory Visit (INDEPENDENT_AMBULATORY_CARE_PROVIDER_SITE_OTHER): Payer: Medicare Other | Admitting: Nurse Practitioner

## 2014-12-13 ENCOUNTER — Encounter: Payer: Self-pay | Admitting: Nurse Practitioner

## 2014-12-13 VITALS — BP 139/81 | HR 80 | Temp 97.5°F | Ht 70.0 in | Wt 210.6 lb

## 2014-12-13 DIAGNOSIS — N529 Male erectile dysfunction, unspecified: Secondary | ICD-10-CM

## 2014-12-13 DIAGNOSIS — E785 Hyperlipidemia, unspecified: Secondary | ICD-10-CM

## 2014-12-13 DIAGNOSIS — I10 Essential (primary) hypertension: Secondary | ICD-10-CM | POA: Diagnosis not present

## 2014-12-13 NOTE — Patient Instructions (Signed)
Exercise to Stay Healthy Exercise helps you become and stay healthy. EXERCISE IDEAS AND TIPS Choose exercises that:  You enjoy.  Fit into your day. You do not need to exercise really hard to be healthy. You can do exercises at a slow or medium level and stay healthy. You can:  Stretch before and after working out.  Try yoga, Pilates, or tai chi.  Lift weights.  Walk fast, swim, jog, run, climb stairs, bicycle, dance, or rollerskate.  Take aerobic classes. Exercises that burn about 150 calories:  Running 1  miles in 15 minutes.  Playing volleyball for 45 to 60 minutes.  Washing and waxing a car for 45 to 60 minutes.  Playing touch football for 45 minutes.  Walking 1  miles in 35 minutes.  Pushing a stroller 1  miles in 30 minutes.  Playing basketball for 30 minutes.  Raking leaves for 30 minutes.  Bicycling 5 miles in 30 minutes.  Walking 2 miles in 30 minutes.  Dancing for 30 minutes.  Shoveling snow for 15 minutes.  Swimming laps for 20 minutes.  Walking up stairs for 15 minutes.  Bicycling 4 miles in 15 minutes.  Gardening for 30 to 45 minutes.  Jumping rope for 15 minutes.  Washing windows or floors for 45 to 60 minutes. Document Released: 08/22/2010 Document Revised: 10/12/2011 Document Reviewed: 08/22/2010 [employer] Patient Information 2015 [employer]. This information is not intended to replace advice given to you by your health care provider. Make sure you discuss any questions you have with your health care provider.  

## 2014-12-13 NOTE — Progress Notes (Signed)
Subjective:    Patient ID: Orlyn Kerman, male    DOB: 10/02/1945, 69 y.o.   MRN: [id number]  Patient here today for follow of chronic medical problems- No complaints today. STill exercising 2-3 x a week- trying to watch diet.   Hypertension This is a chronic problem. The current episode started more than 1 year ago. The problem is controlled. Pertinent negatives include no chest pain, headaches or shortness of breath. Risk factors for coronary artery disease include dyslipidemia and male gender. Past treatments include angiotensin blockers and diuretics. The current treatment provides significant improvement. Compliance problems include diet and exercise.  There is no history of CAD/MI or CVA.  Hyperlipidemia This is a chronic problem. The current episode started more than 1 year ago. The problem is controlled. Recent lipid tests were reviewed and are normal. He has no history of diabetes, hypothyroidism or obesity. Pertinent negatives include no chest pain or shortness of breath. Current antihyperlipidemic treatment includes statins. The current treatment provides moderate improvement of lipids. Compliance problems include adherence to diet and adherence to exercise.  Risk factors for coronary artery disease include dyslipidemia, hypertension and male sex.  erectile dysfunction Uses viagra when he can afford to get.   Review of Systems  Constitutional: Negative.   HENT: Negative.   Eyes: Negative.   Respiratory: Negative.  Negative for shortness of breath.   Cardiovascular: Negative.  Negative for chest pain.  Gastrointestinal: Negative.   Genitourinary: Negative.        Denies urinary incontinence  Neurological: Negative.  Negative for headaches.  Psychiatric/Behavioral: Negative.   All other systems reviewed and are negative.      Objective:   Physical Exam  Constitutional: He is oriented to person, place, and time. He appears well-developed and well-nourished.  HENT:  Head:  Normocephalic.  Right Ear: External ear normal.  Left Ear: External ear normal.  Nose: Nose normal.  Mouth/Throat: Oropharynx is clear and moist.  Eyes: EOM are normal. Pupils are equal, round, and reactive to light.  Neck: Normal range of motion. Neck supple. No JVD present. No thyromegaly present.  Cardiovascular: Normal rate, regular rhythm, normal heart sounds and intact distal pulses.  Exam reveals no gallop and no friction rub.   No murmur heard. Pulmonary/Chest: Effort normal and breath sounds normal. No respiratory distress. He has no wheezes. He has no rales. He exhibits no tenderness.  Abdominal: Soft. Bowel sounds are normal. He exhibits no mass. There is no tenderness.  Genitourinary:  Refuses rectal exam  Musculoskeletal: Normal range of motion. He exhibits no edema.  Lymphadenopathy:    He has no cervical adenopathy.  Neurological: He is alert and oriented to person, place, and time. No cranial nerve deficit.  Skin: Skin is warm and dry.  Psychiatric: He has a normal mood and affect. His behavior is normal. Judgment and thought content normal.    BP 139/81 mmHg  Pulse 80  Temp(Src) 97.5 F (36.4 C) (Oral)  Ht 5' 10" (1.778 m)  Wt 210 lb 9.6 oz (95.528 kg)  BMI 30.22 kg/m2          Assessment & Plan:   1. Essential hypertension Do not add salt to diet - CMP14+EGFR  2. Erectile dysfunction, unspecified erectile dysfunction type  3. Hyperlipidemia Low fat diet - NMR, lipoprofile   Encouraged to do hemoccult cards that were given at last visit Labs pending Health maintenance reviewed Diet and exercise encouraged Continue all meds Follow up  In 3 months     Mary-Margaret Martin, FNP      

## 2014-12-14 LAB — NMR, LIPOPROFILE
Cholesterol: 118 mg/dL (ref 100–199)
HDL Cholesterol by NMR: 42 mg/dL (ref >39)
HDL Particle Number: 30.3 umol/L — ABNORMAL LOW (ref >=30.5)
LDL PARTICLE NUMBER: 671 nmol/L (ref <1000)
LDL Size: 20.9 nm (ref >20.5)
LDL-C: 61 mg/dL (ref 0–99)
LP-IR Score: 42 (ref <=45)
Small LDL Particle Number: 263 nmol/L (ref <=527)
TRIGLYCERIDES BY NMR: 74 mg/dL (ref 0–149)

## 2014-12-14 LAB — CMP14+EGFR
ALK PHOS: 83 IU/L (ref 39–117)
ALT: 13 IU/L (ref 0–44)
AST: 14 IU/L (ref 0–40)
Albumin/Globulin Ratio: 1.8 (ref 1.1–2.5)
Albumin: 3.8 g/dL (ref 3.6–4.8)
BUN / CREAT RATIO: 12 (ref 10–22)
BUN: 22 mg/dL (ref 8–27)
Bilirubin Total: 0.3 mg/dL (ref 0.0–1.2)
CALCIUM: 9.1 mg/dL (ref 8.6–10.2)
CHLORIDE: 100 mmol/L (ref 97–108)
CO2: 27 mmol/L (ref 18–29)
Creatinine, Ser: 1.78 mg/dL — ABNORMAL HIGH (ref 0.76–1.27)
GFR, EST AFRICAN AMERICAN: 45 mL/min/{1.73_m2} — AB (ref >59)
GFR, EST NON AFRICAN AMERICAN: 39 mL/min/{1.73_m2} — AB (ref >59)
GLUCOSE: 118 mg/dL — AB (ref 65–99)
Globulin, Total: 2.1 g/dL (ref 1.5–4.5)
POTASSIUM: 3.6 mmol/L (ref 3.5–5.2)
SODIUM: 141 mmol/L (ref 134–144)
Total Protein: 5.9 g/dL — ABNORMAL LOW (ref 6.0–8.5)

## 2014-12-19 ENCOUNTER — Telehealth: Payer: Self-pay | Admitting: Nurse Practitioner

## 2014-12-19 NOTE — Telephone Encounter (Signed)
Pt having increased anger, stress appt scheduled with MMM per request

## 2014-12-24 ENCOUNTER — Ambulatory Visit (INDEPENDENT_AMBULATORY_CARE_PROVIDER_SITE_OTHER): Payer: Medicare Other | Admitting: Nurse Practitioner

## 2014-12-24 ENCOUNTER — Encounter: Payer: Self-pay | Admitting: Nurse Practitioner

## 2014-12-24 VITALS — BP 136/82 | HR 88 | Temp 97.8°F | Ht 70.0 in | Wt 209.6 lb

## 2014-12-24 DIAGNOSIS — N529 Male erectile dysfunction, unspecified: Secondary | ICD-10-CM | POA: Diagnosis not present

## 2014-12-24 DIAGNOSIS — E785 Hyperlipidemia, unspecified: Secondary | ICD-10-CM | POA: Diagnosis not present

## 2014-12-24 DIAGNOSIS — F32A Depression, unspecified: Secondary | ICD-10-CM | POA: Insufficient documentation

## 2014-12-24 DIAGNOSIS — F329 Major depressive disorder, single episode, unspecified: Secondary | ICD-10-CM | POA: Insufficient documentation

## 2014-12-24 DIAGNOSIS — F411 Generalized anxiety disorder: Secondary | ICD-10-CM

## 2014-12-24 DIAGNOSIS — I10 Essential (primary) hypertension: Secondary | ICD-10-CM | POA: Diagnosis not present

## 2014-12-24 MED ORDER — CITALOPRAM HYDROBROMIDE 20 MG PO TABS: 20.0000 mg | ORAL_TABLET | Freq: Every day | ORAL | Status: DC

## 2014-12-24 NOTE — Progress Notes (Signed)
Subjective:    Patient ID: Dean Mcconnell, male    DOB: 09/05/1945, 69 y.o.   MRN: [id number]  Patient here today for follow of chronic medical problems- No complaints today. STill exercising 2-3 x a week- trying to watch diet. Only complaint is that he is very moody and gets angry very easily.   Hypertension This is a chronic problem. The current episode started more than 1 year ago. The problem is controlled. Pertinent negatives include no chest pain, headaches or shortness of breath. Risk factors for coronary artery disease include dyslipidemia and male gender. Past treatments include angiotensin blockers and diuretics. The current treatment provides significant improvement. Compliance problems include diet and exercise.  There is no history of CAD/MI or CVA.  Hyperlipidemia This is a chronic problem. The current episode started more than 1 year ago. The problem is controlled. Recent lipid tests were reviewed and are normal. He has no history of diabetes, hypothyroidism or obesity. Pertinent negatives include no chest pain or shortness of breath. Current antihyperlipidemic treatment includes statins. The current treatment provides moderate improvement of lipids. Compliance problems include adherence to diet and adherence to exercise.  Risk factors for coronary artery disease include dyslipidemia, hypertension and male sex.  erectile dysfunction Uses viagra when he can afford to get.   Review of Systems  Constitutional: Negative.   HENT: Negative.   Eyes: Negative.   Respiratory: Negative.  Negative for shortness of breath.   Cardiovascular: Negative.  Negative for chest pain.  Gastrointestinal: Negative.   Genitourinary: Negative.        Denies urinary incontinence  Neurological: Negative.  Negative for headaches.  Psychiatric/Behavioral: Negative.   All other systems reviewed and are negative.      Objective:   Physical Exam  Constitutional: He is oriented to person, place, and time.  He appears well-developed and well-nourished.  HENT:  Head: Normocephalic.  Right Ear: External ear normal.  Left Ear: External ear normal.  Nose: Nose normal.  Mouth/Throat: Oropharynx is clear and moist.  Eyes: EOM are normal. Pupils are equal, round, and reactive to light.  Neck: Normal range of motion. Neck supple. No JVD present. No thyromegaly present.  Cardiovascular: Normal rate, regular rhythm, normal heart sounds and intact distal pulses.  Exam reveals no gallop and no friction rub.   No murmur heard. Pulmonary/Chest: Effort normal and breath sounds normal. No respiratory distress. He has no wheezes. He has no rales. He exhibits no tenderness.  Abdominal: Soft. Bowel sounds are normal. He exhibits no mass. There is no tenderness.  Genitourinary:  Refuses rectal exam  Musculoskeletal: Normal range of motion. He exhibits no edema.  Lymphadenopathy:    He has no cervical adenopathy.  Neurological: He is alert and oriented to person, place, and time. No cranial nerve deficit.  Skin: Skin is warm and dry.  Psychiatric: He has a normal mood and affect. His behavior is normal. Judgment and thought content normal.    BP 136/82 mmHg  Pulse 88  Temp(Src) 97.8 F (36.6 C) (Oral)  Ht _0  (1.778 m)  Wt 209 lb 9.6 oz (95.074 kg)  BMI 30.07 kg/m2      Assessment & Plan:   1. Essential hypertension Do not add slat to diet - CMP14+EGFR  2. Erectile dysfunction, unspecified erectile dysfunction type  3. Hyperlipidemia Low fat diet - NMR, lipoprofile  4. GAD (generalized anxiety disorder) Stress management  5. Depression - citalopram (CELEXA) 20 MG tablet; Take 1 tablet (20 mg total) by  mouth daily.  Dispense: 30 tablet; Refill: 5    Labs pending Health maintenance reviewed Diet and exercise encouraged Continue all meds Follow up  In 3 month   [place], FNP

## 2014-12-24 NOTE — Patient Instructions (Signed)
Stress and Stress Management Stress is a normal reaction to life events. It is what you feel when life demands more than you are used to or more than you can handle. Some stress can be useful. For example, the stress reaction can help you catch the last bus of the day, study for a test, or meet a deadline at work. But stress that occurs too often or for too long can cause problems. It can affect your emotional health and interfere with relationships and normal daily activities. Too much stress can weaken your immune system and increase your risk for physical illness. If you already have a medical problem, stress can make it worse. CAUSES  All sorts of life events may cause stress. An event that causes stress for one person may not be stressful for another person. Major life events commonly cause stress. These may be positive or negative. Examples include losing your job, moving into a new home, getting married, having a baby, or losing a loved one. Less obvious life events may also cause stress, especially if they occur day after day or in combination. Examples include working long hours, driving in traffic, caring for children, being in debt, or being in a difficult relationship. SIGNS AND SYMPTOMS Stress may cause emotional symptoms including, the following:  Anxiety. This is feeling worried, afraid, on edge, overwhelmed, or out of control.  Anger. This is feeling irritated or impatient.  Depression. This is feeling sad, down, helpless, or guilty.  Difficulty focusing, remembering, or making decisions. Stress may cause physical symptoms, including the following:   Aches and pains. These may affect your head, neck, back, stomach, or other areas of your body.  Tight muscles or clenched jaw.  Low energy or trouble sleeping. Stress may cause unhealthy behaviors, including the following:   Eating to feel better (overeating) or skipping meals.  Sleeping too little, too much, or both.  Working  too much or putting off tasks (procrastination).  Smoking, drinking alcohol, or using drugs to feel better. DIAGNOSIS  Stress is diagnosed through an assessment by your health care provider. Your health care provider will ask questions about your symptoms and any stressful life events.Your health care provider will also ask about your medical history and may order blood tests or other tests. Certain medical conditions and medicine can cause physical symptoms similar to stress. Mental illness can cause emotional symptoms and unhealthy behaviors similar to stress. Your health care provider may refer you to a mental health professional for further evaluation.  TREATMENT  Stress management is the recommended treatment for stress.The goals of stress management are reducing stressful life events and coping with stress in healthy ways.  Techniques for reducing stressful life events include the following:  Stress identification. Self-monitor for stress and identify what causes stress for you. These skills may help you to avoid some stressful events.  Time management. Set your priorities, keep a calendar of events, and learn to say "no." These tools can help you avoid making too many commitments. Techniques for coping with stress include the following:  Rethinking the problem. Try to think realistically about stressful events rather than ignoring them or overreacting. Try to find the positives in a stressful situation rather than focusing on the negatives.  Exercise. Physical exercise can release both physical and emotional tension. The key is to find a form of exercise you enjoy and do it regularly.  Relaxation techniques. These relax the body and mind. Examples include yoga, meditation, tai chi, biofeedback, deep  breathing, progressive muscle relaxation, listening to music, being out in nature, journaling, and other hobbies. Again, the key is to find one or more that you enjoy and can do  regularly.  Healthy lifestyle. Eat a balanced diet, get plenty of sleep, and do not smoke. Avoid using alcohol or drugs to relax.  Strong support network. Spend time with family, friends, or other people you enjoy being around.Express your feelings and talk things over with someone you trust. Counseling or talktherapy with a mental health professional may be helpful if you are having difficulty managing stress on your own. Medicine is typically not recommended for the treatment of stress.Talk to your health care provider if you think you need medicine for symptoms of stress. HOME CARE INSTRUCTIONS  Keep all follow-up visits as directed by your health care provider.  Take all medicines as directed by your health care provider. SEEK MEDICAL CARE IF:  Your symptoms get worse or you start having new symptoms.  You feel overwhelmed by your problems and can no longer manage them on your own. SEEK IMMEDIATE MEDICAL CARE IF:  You feel like hurting yourself or someone else. Document Released: 01/13/2001 Document Revised: 12/04/2013 Document Reviewed: 03/14/2013 [employer] Patient Information 2015 [employer]. This information is not intended to replace advice given to you by your health care provider. Make sure you discuss any questions you have with your health care provider.  

## 2014-12-24 NOTE — Addendum Note (Signed)
Addended by: Chevis Pretty on: 12/24/2014 11:24 AM   Modules accepted: Orders

## 2015-01-21 ENCOUNTER — Telehealth: Payer: Self-pay | Admitting: Nurse Practitioner

## 2015-01-21 ENCOUNTER — Other Ambulatory Visit: Payer: Self-pay | Admitting: Nurse Practitioner

## 2015-01-21 MED ORDER — CITALOPRAM HYDROBROMIDE 40 MG PO TABS: 40.0000 mg | ORAL_TABLET | Freq: Every day | ORAL | Status: DC

## 2015-01-21 NOTE — Telephone Encounter (Signed)
Spoke with patient- he states does well with the Celexa in the morning, but feels like it wears off in the early afternoon and he gets frustrated very easily.  He is wondering if he should increase his dosage?

## 2015-01-21 NOTE — Telephone Encounter (Signed)
We can try increasing dose- rx sent to pharmacy

## 2015-01-21 NOTE — Telephone Encounter (Signed)
Patient notified new prescription for 40 mg was sent in.

## 2015-02-15 ENCOUNTER — Telehealth: Payer: Self-pay | Admitting: Nurse Practitioner

## 2015-02-15 ENCOUNTER — Other Ambulatory Visit: Payer: Self-pay | Admitting: *Deleted

## 2015-02-15 ENCOUNTER — Ambulatory Visit (INDEPENDENT_AMBULATORY_CARE_PROVIDER_SITE_OTHER): Payer: Medicare Other | Admitting: Nurse Practitioner

## 2015-02-15 ENCOUNTER — Encounter: Payer: Self-pay | Admitting: Nurse Practitioner

## 2015-02-15 ENCOUNTER — Encounter (INDEPENDENT_AMBULATORY_CARE_PROVIDER_SITE_OTHER): Payer: Self-pay

## 2015-02-15 VITALS — BP 126/63 | HR 71 | Temp 97.0°F | Ht 70.0 in | Wt 200.0 lb

## 2015-02-15 DIAGNOSIS — S161XXA Strain of muscle, fascia and tendon at neck level, initial encounter: Secondary | ICD-10-CM | POA: Diagnosis not present

## 2015-02-15 DIAGNOSIS — F411 Generalized anxiety disorder: Secondary | ICD-10-CM

## 2015-02-15 MED ORDER — CYCLOBENZAPRINE HCL 5 MG PO TABS: 5.0000 mg | ORAL_TABLET | Freq: Three times a day (TID) | ORAL | Status: DC | PRN

## 2015-02-15 MED ORDER — ALPRAZOLAM 0.25 MG PO TABS: 0.2500 mg | ORAL_TABLET | Freq: Two times a day (BID) | ORAL | Status: DC | PRN

## 2015-02-15 MED ORDER — CITALOPRAM HYDROBROMIDE 40 MG PO TABS: 40.0000 mg | ORAL_TABLET | Freq: Every day | ORAL | Status: DC

## 2015-02-15 NOTE — Progress Notes (Signed)
   Subjective:    Patient ID: Dean Mcconnell, male    DOB: 03-Apr-1946, 69 y.o.   MRN: [id number]  HPI Patient in today c/o neck pian that started 2 weeks agos ago when he was moving a heavy treadmill. Pain starts in right side of neck and radiates up head. He has taken advil which has helped.   * His girlfriend has been acting agitated and confused at times and gets angry easily. He took something that his sister gave hm and that calms him down. He is currently on citalopram.     Review of Systems  Constitutional: Negative.   HENT: Negative.   Respiratory: Negative.   Cardiovascular: Negative.   Genitourinary: Negative.   Neurological: Negative.   Psychiatric/Behavioral: The patient is nervous/anxious.   All other systems reviewed and are negative.      Objective:   Physical Exam  Constitutional: He is oriented to person, place, and time. He appears well-developed and well-nourished.  Cardiovascular: Normal rate, regular rhythm and normal heart sounds.   Pulmonary/Chest: Effort normal and breath sounds normal.  Musculoskeletal:  FROM of cervical spine with pain on flexion and rotation in either direction. Motor strength and sensation of upper ext intact.  Neurological: He is alert and oriented to person, place, and time.  Skin: Skin is warm.  Psychiatric: He has a normal mood and affect. His behavior is normal. Judgment and thought content normal.    BP 126/63 mmHg  Pulse 71  Temp(Src) 97 F (36.1 C) (Oral)  Ht 5\' 10"  (1.778 m)  Wt 200 lb (90.719 kg)  BMI 28.70 kg/m2       Assessment & Plan:  1. GAD (generalized anxiety disorder) Stress management Only take xanax as needed - ALPRAZolam (XANAX) 0.25 MG tablet; Take 1 tablet (0.25 mg total) by mouth 2 (two) times daily as needed for anxiety.  Dispense: 20 tablet; Refill: 0  2. Neck strain, initial encounter Moist heat Rest rto prn - cyclobenzaprine (FLEXERIL) 5 MG tablet; Take 1 tablet (5 mg total) by mouth 3  (three) times daily as needed for muscle spasms.  Dispense: 30 tablet; Refill: [place], FNP

## 2015-02-15 NOTE — Patient Instructions (Signed)
Stress and Stress Management Stress is a normal reaction to life events. It is what you feel when life demands more than you are used to or more than you can handle. Some stress can be useful. For example, the stress reaction can help you catch the last bus of the day, study for a test, or meet a deadline at work. But stress that occurs too often or for too long can cause problems. It can affect your emotional health and interfere with relationships and normal daily activities. Too much stress can weaken your immune system and increase your risk for physical illness. If you already have a medical problem, stress can make it worse. CAUSES  All sorts of life events may cause stress. An event that causes stress for one person may not be stressful for another person. Major life events commonly cause stress. These may be positive or negative. Examples include losing your job, moving into a new home, getting married, having a baby, or losing a loved one. Less obvious life events may also cause stress, especially if they occur day after day or in combination. Examples include working long hours, driving in traffic, caring for children, being in debt, or being in a difficult relationship. SIGNS AND SYMPTOMS Stress may cause emotional symptoms including, the following:  Anxiety. This is feeling worried, afraid, on edge, overwhelmed, or out of control.  Anger. This is feeling irritated or impatient.  Depression. This is feeling sad, down, helpless, or guilty.  Difficulty focusing, remembering, or making decisions. Stress may cause physical symptoms, including the following:   Aches and pains. These may affect your head, neck, back, stomach, or other areas of your body.  Tight muscles or clenched jaw.  Low energy or trouble sleeping. Stress may cause unhealthy behaviors, including the following:   Eating to feel better (overeating) or skipping meals.  Sleeping too little, too much, or both.  Working  too much or putting off tasks (procrastination).  Smoking, drinking alcohol, or using drugs to feel better. DIAGNOSIS  Stress is diagnosed through an assessment by your health care provider. Your health care provider will ask questions about your symptoms and any stressful life events.Your health care provider will also ask about your medical history and may order blood tests or other tests. Certain medical conditions and medicine can cause physical symptoms similar to stress. Mental illness can cause emotional symptoms and unhealthy behaviors similar to stress. Your health care provider may refer you to a mental health professional for further evaluation.  TREATMENT  Stress management is the recommended treatment for stress.The goals of stress management are reducing stressful life events and coping with stress in healthy ways.  Techniques for reducing stressful life events include the following:  Stress identification. Self-monitor for stress and identify what causes stress for you. These skills may help you to avoid some stressful events.  Time management. Set your priorities, keep a calendar of events, and learn to say "no." These tools can help you avoid making too many commitments. Techniques for coping with stress include the following:  Rethinking the problem. Try to think realistically about stressful events rather than ignoring them or overreacting. Try to find the positives in a stressful situation rather than focusing on the negatives.  Exercise. Physical exercise can release both physical and emotional tension. The key is to find a form of exercise you enjoy and do it regularly.  Relaxation techniques. These relax the body and mind. Examples include yoga, meditation, tai chi, biofeedback, deep  breathing, progressive muscle relaxation, listening to music, being out in nature, journaling, and other hobbies. Again, the key is to find one or more that you enjoy and can do  regularly.  Healthy lifestyle. Eat a balanced diet, get plenty of sleep, and do not smoke. Avoid using alcohol or drugs to relax.  Strong support network. Spend time with family, friends, or other people you enjoy being around.Express your feelings and talk things over with someone you trust. Counseling or talktherapy with a mental health professional may be helpful if you are having difficulty managing stress on your own. Medicine is typically not recommended for the treatment of stress.Talk to your health care provider if you think you need medicine for symptoms of stress. HOME CARE INSTRUCTIONS  Keep all follow-up visits as directed by your health care provider.  Take all medicines as directed by your health care provider. SEEK MEDICAL CARE IF:  Your symptoms get worse or you start having new symptoms.  You feel overwhelmed by your problems and can no longer manage them on your own. SEEK IMMEDIATE MEDICAL CARE IF:  You feel like hurting yourself or someone else. Document Released: 01/13/2001 Document Revised: 12/04/2013 Document Reviewed: 03/14/2013 [employer] Patient Information 2015 [employer]. This information is not intended to replace advice given to you by your health care provider. Make sure you discuss any questions you have with your health care provider.  

## 2015-03-21 ENCOUNTER — Ambulatory Visit (INDEPENDENT_AMBULATORY_CARE_PROVIDER_SITE_OTHER): Payer: Medicare Other | Admitting: Nurse Practitioner

## 2015-03-21 ENCOUNTER — Encounter: Payer: Self-pay | Admitting: Nurse Practitioner

## 2015-03-21 ENCOUNTER — Ambulatory Visit (INDEPENDENT_AMBULATORY_CARE_PROVIDER_SITE_OTHER): Payer: Medicare Other

## 2015-03-21 VITALS — BP 131/71 | HR 67 | Temp 97.5°F | Ht 70.0 in | Wt 197.0 lb

## 2015-03-21 DIAGNOSIS — Z6828 Body mass index (BMI) 28.0-28.9, adult: Secondary | ICD-10-CM | POA: Diagnosis not present

## 2015-03-21 DIAGNOSIS — E785 Hyperlipidemia, unspecified: Secondary | ICD-10-CM | POA: Diagnosis not present

## 2015-03-21 DIAGNOSIS — Z720 Tobacco use: Secondary | ICD-10-CM | POA: Diagnosis not present

## 2015-03-21 DIAGNOSIS — F324 Major depressive disorder, single episode, in partial remission: Secondary | ICD-10-CM

## 2015-03-21 DIAGNOSIS — F325 Major depressive disorder, single episode, in full remission: Secondary | ICD-10-CM

## 2015-03-21 DIAGNOSIS — I10 Essential (primary) hypertension: Secondary | ICD-10-CM | POA: Diagnosis not present

## 2015-03-21 DIAGNOSIS — N529 Male erectile dysfunction, unspecified: Secondary | ICD-10-CM | POA: Diagnosis not present

## 2015-03-21 MED ORDER — HYDROCHLOROTHIAZIDE 25 MG PO TABS: 25.0000 mg | ORAL_TABLET | Freq: Every day | ORAL | Status: DC

## 2015-03-21 MED ORDER — LOSARTAN POTASSIUM 100 MG PO TABS: 100.0000 mg | ORAL_TABLET | Freq: Every day | ORAL | Status: DC

## 2015-03-21 MED ORDER — ATORVASTATIN CALCIUM 40 MG PO TABS: 40.0000 mg | ORAL_TABLET | Freq: Every day | ORAL | Status: DC

## 2015-03-21 MED ORDER — SILDENAFIL CITRATE 100 MG PO TABS
50.0000 mg | ORAL_TABLET | Freq: Every day | ORAL | Status: DC | PRN
Start: 2015-03-21 — End: 2016-10-20

## 2015-03-21 NOTE — Progress Notes (Signed)
Subjective:    Patient ID: Dean Mcconnell, male    DOB: 04-17-1946, 69 y.o.   MRN: [id number]  Patient here today for follow of chronic medical problems- No complaints today.    Hypertension This is a chronic problem. The current episode started more than 1 year ago. The problem is controlled. Pertinent negatives include no chest pain, headaches or shortness of breath. Risk factors for coronary artery disease include dyslipidemia and male gender. Past treatments include angiotensin blockers and diuretics. The current treatment provides significant improvement. Compliance problems include diet and exercise.  There is no history of CAD/MI or CVA.  Hyperlipidemia This is a chronic problem. The current episode started more than 1 year ago. The problem is controlled. Recent lipid tests were reviewed and are normal. He has no history of diabetes, hypothyroidism or obesity. Pertinent negatives include no chest pain or shortness of breath. Current antihyperlipidemic treatment includes statins. The current treatment provides moderate improvement of lipids. Compliance problems include adherence to diet and adherence to exercise.  Risk factors for coronary artery disease include dyslipidemia, hypertension and male sex.  erectile dysfunction Uses viagra when he can afford to get. GAD/depression Patient was seen mid July with c/o depression and anxiety and was started on celexa and low dose xanax- he says that he is doing much better- still has occassional episodes where he is "not nice" but for the most part is doing better. No c/o side effects.     Review of Systems  Constitutional: Negative.   HENT: Negative.   Eyes: Negative.   Respiratory: Negative.  Negative for shortness of breath.   Cardiovascular: Negative.  Negative for chest pain.  Gastrointestinal: Negative.   Genitourinary: Negative.        Denies urinary incontinence  Neurological: Negative.  Negative for headaches.   Psychiatric/Behavioral: Negative.   All other systems reviewed and are negative.      Objective:   Physical Exam  Constitutional: He is oriented to person, place, and time. He appears well-developed and well-nourished.  HENT:  Head: Normocephalic.  Right Ear: External ear normal.  Left Ear: External ear normal.  Nose: Nose normal.  Mouth/Throat: Oropharynx is clear and moist.  Eyes: EOM are normal. Pupils are equal, round, and reactive to light.  Neck: Normal range of motion. Neck supple. No JVD present. No thyromegaly present.  Cardiovascular: Normal rate, regular rhythm, normal heart sounds and intact distal pulses.  Exam reveals no gallop and no friction rub.   No murmur heard. Pulmonary/Chest: Effort normal and breath sounds normal. No respiratory distress. He has no wheezes. He has no rales. He exhibits no tenderness.  Abdominal: Soft. Bowel sounds are normal. He exhibits no mass. There is no tenderness.  Genitourinary:  Refuses rectal exam  Musculoskeletal: Normal range of motion. He exhibits no edema.  Lymphadenopathy:    He has no cervical adenopathy.  Neurological: He is alert and oriented to person, place, and time. No cranial nerve deficit.  Skin: Skin is warm and dry.  Psychiatric: He has a normal mood and affect. His behavior is normal. Judgment and thought content normal.   BP 131/71 mmHg  Pulse 67  Temp(Src) 97.5 F (36.4 C) (Oral)  Ht _0  (1.778 m)  Wt 197 lb (89.359 kg)  BMI 28.27 kg/m2  Adella Nissen, FNP  Chest x ray- chronic bronchitic changes-Preliminary reading by Ronnald Collum, FNP  [facility]       Assessment & Plan:   1. Essential hypertension Do to add salt  to diet - losartan (COZAAR) 100 MG tablet; Take 1 tablet (100 mg total) by mouth daily.  Dispense: 93 tablet; Refill: 1 - hydrochlorothiazide (HYDRODIURIL) 25 MG tablet; Take 1 tablet (25 mg total) by mouth daily.  Dispense: 93 tablet; Refill: 1 - CMP14+EGFR - EKG  12-Lead  2. Erectile dysfunction, unspecified erectile dysfunction type - sildenafil (VIAGRA) 100 MG tablet; Take 0.5-1 tablets (50-100 mg total) by mouth daily as needed for erectile dysfunction.  Dispense: 9 tablet; Refill: 11  3. Hyperlipidemia Low fta diet - atorvastatin (LIPITOR) 40 MG tablet; Take 1 tablet (40 mg total) by mouth daily.  Dispense: 93 tablet; Refill: 1 - Lipid panel  4. Depression, major, in remission Stress management Continue celexa as rx  5. BMI 28.0-28.9,adult Discussed diet and exercise for person with BMI >25 Will recheck weight in 3-6 months   6. Tobacco abuse Smoking cessation encouraged - DG Chest 2 View    Labs pending Health maintenance reviewed Diet and exercise encouraged Continue all meds Follow up  In 3 months   [place], FNP

## 2015-03-21 NOTE — Patient Instructions (Signed)
Stress and Stress Management Stress is a normal reaction to life events. It is what you feel when life demands more than you are used to or more than you can handle. Some stress can be useful. For example, the stress reaction can help you catch the last bus of the day, study for a test, or meet a deadline at work. But stress that occurs too often or for too long can cause problems. It can affect your emotional health and interfere with relationships and normal daily activities. Too much stress can weaken your immune system and increase your risk for physical illness. If you already have a medical problem, stress can make it worse. CAUSES  All sorts of life events may cause stress. An event that causes stress for one person may not be stressful for another person. Major life events commonly cause stress. These may be positive or negative. Examples include losing your job, moving into a new home, getting married, having a baby, or losing a loved one. Less obvious life events may also cause stress, especially if they occur day after day or in combination. Examples include working long hours, driving in traffic, caring for children, being in debt, or being in a difficult relationship. SIGNS AND SYMPTOMS Stress may cause emotional symptoms including, the following:  Anxiety. This is feeling worried, afraid, on edge, overwhelmed, or out of control.  Anger. This is feeling irritated or impatient.  Depression. This is feeling sad, down, helpless, or guilty.  Difficulty focusing, remembering, or making decisions. Stress may cause physical symptoms, including the following:   Aches and pains. These may affect your head, neck, back, stomach, or other areas of your body.  Tight muscles or clenched jaw.  Low energy or trouble sleeping. Stress may cause unhealthy behaviors, including the following:   Eating to feel better (overeating) or skipping meals.  Sleeping too little, too much, or both.  Working  too much or putting off tasks (procrastination).  Smoking, drinking alcohol, or using drugs to feel better. DIAGNOSIS  Stress is diagnosed through an assessment by your health care provider. Your health care provider will ask questions about your symptoms and any stressful life events.Your health care provider will also ask about your medical history and may order blood tests or other tests. Certain medical conditions and medicine can cause physical symptoms similar to stress. Mental illness can cause emotional symptoms and unhealthy behaviors similar to stress. Your health care provider may refer you to a mental health professional for further evaluation.  TREATMENT  Stress management is the recommended treatment for stress.The goals of stress management are reducing stressful life events and coping with stress in healthy ways.  Techniques for reducing stressful life events include the following:  Stress identification. Self-monitor for stress and identify what causes stress for you. These skills may help you to avoid some stressful events.  Time management. Set your priorities, keep a calendar of events, and learn to say "no." These tools can help you avoid making too many commitments. Techniques for coping with stress include the following:  Rethinking the problem. Try to think realistically about stressful events rather than ignoring them or overreacting. Try to find the positives in a stressful situation rather than focusing on the negatives.  Exercise. Physical exercise can release both physical and emotional tension. The key is to find a form of exercise you enjoy and do it regularly.  Relaxation techniques. These relax the body and mind. Examples include yoga, meditation, tai chi, biofeedback, deep  breathing, progressive muscle relaxation, listening to music, being out in nature, journaling, and other hobbies. Again, the key is to find one or more that you enjoy and can do  regularly.  Healthy lifestyle. Eat a balanced diet, get plenty of sleep, and do not smoke. Avoid using alcohol or drugs to relax.  Strong support network. Spend time with family, friends, or other people you enjoy being around.Express your feelings and talk things over with someone you trust. Counseling or talktherapy with a mental health professional may be helpful if you are having difficulty managing stress on your own. Medicine is typically not recommended for the treatment of stress.Talk to your health care provider if you think you need medicine for symptoms of stress. HOME CARE INSTRUCTIONS  Keep all follow-up visits as directed by your health care provider.  Take all medicines as directed by your health care provider. SEEK MEDICAL CARE IF:  Your symptoms get worse or you start having new symptoms.  You feel overwhelmed by your problems and can no longer manage them on your own. SEEK IMMEDIATE MEDICAL CARE IF:  You feel like hurting yourself or someone else. Document Released: 01/13/2001 Document Revised: 12/04/2013 Document Reviewed: 03/14/2013 [employer] Patient Information 2015 [employer]. This information is not intended to replace advice given to you by your health care provider. Make sure you discuss any questions you have with your health care provider.  

## 2015-03-22 LAB — CMP14+EGFR
A/G RATIO: 1.9 (ref 1.1–2.5)
ALBUMIN: 3.9 g/dL (ref 3.6–4.8)
ALT: 11 IU/L (ref 0–44)
AST: 14 IU/L (ref 0–40)
Alkaline Phosphatase: 97 IU/L (ref 39–117)
BUN / CREAT RATIO: 13 (ref 10–22)
BUN: 20 mg/dL (ref 8–27)
Bilirubin Total: 0.7 mg/dL (ref 0.0–1.2)
CO2: 27 mmol/L (ref 18–29)
CREATININE: 1.55 mg/dL — AB (ref 0.76–1.27)
Calcium: 9 mg/dL (ref 8.6–10.2)
Chloride: 100 mmol/L (ref 97–108)
GFR calc Af Amer: 52 mL/min/{1.73_m2} — ABNORMAL LOW (ref >59)
GFR, EST NON AFRICAN AMERICAN: 45 mL/min/{1.73_m2} — AB (ref >59)
GLOBULIN, TOTAL: 2.1 g/dL (ref 1.5–4.5)
Glucose: 109 mg/dL — ABNORMAL HIGH (ref 65–99)
POTASSIUM: 3.3 mmol/L — AB (ref 3.5–5.2)
SODIUM: 142 mmol/L (ref 134–144)
Total Protein: 6 g/dL (ref 6.0–8.5)

## 2015-03-22 LAB — LIPID PANEL
Chol/HDL Ratio: 2.8 ratio units (ref 0.0–5.0)
Cholesterol, Total: 121 mg/dL (ref 100–199)
HDL: 44 mg/dL (ref >39)
LDL Calculated: 60 mg/dL (ref 0–99)
Triglycerides: 87 mg/dL (ref 0–149)
VLDL Cholesterol Cal: 17 mg/dL (ref 5–40)

## 2015-04-11 ENCOUNTER — Telehealth: Payer: Self-pay | Admitting: Nurse Practitioner

## 2015-04-11 NOTE — Telephone Encounter (Signed)
ntbs to discuss so we can make changes

## 2015-04-11 NOTE — Telephone Encounter (Signed)
Patient does not feel like celexa is not helping, having anger issues. Please advise. They would like to speak with you

## 2015-04-12 DIAGNOSIS — S6991XA Unspecified injury of right wrist, hand and finger(s), initial encounter: Secondary | ICD-10-CM | POA: Diagnosis not present

## 2015-04-12 DIAGNOSIS — M79641 Pain in right hand: Secondary | ICD-10-CM | POA: Diagnosis not present

## 2015-04-15 NOTE — Telephone Encounter (Signed)
Patient wants to wait for appt in November with mmm to address this.

## 2015-06-24 ENCOUNTER — Ambulatory Visit: Payer: Medicare Other | Admitting: Nurse Practitioner

## 2015-06-28 ENCOUNTER — Encounter: Payer: Self-pay | Admitting: Nurse Practitioner

## 2015-06-28 ENCOUNTER — Ambulatory Visit (INDEPENDENT_AMBULATORY_CARE_PROVIDER_SITE_OTHER): Payer: Medicare Other | Admitting: Nurse Practitioner

## 2015-06-28 VITALS — BP 114/61 | HR 69 | Temp 96.9°F | Ht 70.0 in | Wt 202.0 lb

## 2015-06-28 DIAGNOSIS — Z1159 Encounter for screening for other viral diseases: Secondary | ICD-10-CM

## 2015-06-28 DIAGNOSIS — Z1212 Encounter for screening for malignant neoplasm of rectum: Secondary | ICD-10-CM

## 2015-06-28 DIAGNOSIS — N529 Male erectile dysfunction, unspecified: Secondary | ICD-10-CM | POA: Diagnosis not present

## 2015-06-28 DIAGNOSIS — F329 Major depressive disorder, single episode, unspecified: Secondary | ICD-10-CM | POA: Diagnosis not present

## 2015-06-28 DIAGNOSIS — Z6828 Body mass index (BMI) 28.0-28.9, adult: Secondary | ICD-10-CM

## 2015-06-28 DIAGNOSIS — I10 Essential (primary) hypertension: Secondary | ICD-10-CM

## 2015-06-28 DIAGNOSIS — E785 Hyperlipidemia, unspecified: Secondary | ICD-10-CM

## 2015-06-28 DIAGNOSIS — F32A Depression, unspecified: Secondary | ICD-10-CM

## 2015-06-28 MED ORDER — LOSARTAN POTASSIUM 100 MG PO TABS: 100.0000 mg | ORAL_TABLET | Freq: Every day | ORAL | Status: DC

## 2015-06-28 MED ORDER — CITALOPRAM HYDROBROMIDE 40 MG PO TABS: 40.0000 mg | ORAL_TABLET | Freq: Every day | ORAL | Status: DC

## 2015-06-28 MED ORDER — ATORVASTATIN CALCIUM 40 MG PO TABS: 40.0000 mg | ORAL_TABLET | Freq: Every day | ORAL | Status: DC

## 2015-06-28 MED ORDER — HYDROCHLOROTHIAZIDE 25 MG PO TABS: 25.0000 mg | ORAL_TABLET | Freq: Every day | ORAL | Status: DC

## 2015-06-28 NOTE — Progress Notes (Signed)
Subjective:    Patient ID: Dean Mcconnell, male    DOB: 06-10-1946, 69 y.o.   MRN: [id number]  Patient here today for follow of chronic medical problems- No complaints today.    Hypertension This is a chronic problem. The current episode started more than 1 year ago. The problem is controlled. Pertinent negatives include no chest pain, headaches or shortness of breath. Risk factors for coronary artery disease include dyslipidemia and male gender. Past treatments include angiotensin blockers and diuretics. The current treatment provides significant improvement. Compliance problems include diet and exercise.  There is no history of CAD/MI or CVA.  Hyperlipidemia This is a chronic problem. The current episode started more than 1 year ago. The problem is controlled. Recent lipid tests were reviewed and are normal. He has no history of diabetes, hypothyroidism or obesity. Pertinent negatives include no chest pain or shortness of breath. Current antihyperlipidemic treatment includes statins. The current treatment provides moderate improvement of lipids. Compliance problems include adherence to diet and adherence to exercise.  Risk factors for coronary artery disease include dyslipidemia, hypertension and male sex.  erectile dysfunction Uses viagra when he can afford to get. GAD/depression Patient was seen mid July with c/o depression and anxiety and was started on celexa and low dose xanax- he says that he is doing much better- still has occassional episodes where he is "not nice" but for the most part is doing better. No c/o side effects.     Review of Systems  Constitutional: Negative.   HENT: Negative.   Eyes: Negative.   Respiratory: Negative.  Negative for shortness of breath.   Cardiovascular: Negative.  Negative for chest pain.  Gastrointestinal: Negative.   Genitourinary: Negative.        Denies urinary incontinence  Neurological: Negative.  Negative for headaches.   Psychiatric/Behavioral: Negative.   All other systems reviewed and are negative.      Objective:   Physical Exam  Constitutional: He is oriented to person, place, and time. He appears well-developed and well-nourished.  HENT:  Head: Normocephalic.  Right Ear: External ear normal.  Left Ear: External ear normal.  Nose: Nose normal.  Mouth/Throat: Oropharynx is clear and moist.  Eyes: EOM are normal. Pupils are equal, round, and reactive to light.  Neck: Normal range of motion. Neck supple. No JVD present. No thyromegaly present.  Cardiovascular: Normal rate, regular rhythm, normal heart sounds and intact distal pulses.  Exam reveals no gallop and no friction rub.   No murmur heard. Pulmonary/Chest: Effort normal and breath sounds normal. No respiratory distress. He has no wheezes. He has no rales. He exhibits no tenderness.  Abdominal: Soft. Bowel sounds are normal. He exhibits no mass. There is no tenderness.  Genitourinary:  Refuses rectal exam  Musculoskeletal: Normal range of motion. He exhibits no edema.  Lymphadenopathy:    He has no cervical adenopathy.  Neurological: He is alert and oriented to person, place, and time. No cranial nerve deficit.  Skin: Skin is warm and dry.  Psychiatric: He has a normal mood and affect. His behavior is normal. Judgment and thought content normal.   BP 114/61 mmHg  Pulse 69  Temp(Src) 96.9 F (36.1 C) (Oral)  Ht 5' 10" (1.778 m)  Wt 202 lb (91.627 kg)  BMI 28.98 kg/m2       Assessment & Plan:   1. Essential hypertension Do not add salt to diet - losartan (COZAAR) 100 MG tablet; Take 1 tablet (100 mg total) by mouth daily.  Dispense: 93 tablet; Refill: 1 - hydrochlorothiazide (HYDRODIURIL) 25 MG tablet; Take 1 tablet (25 mg total) by mouth daily.  Dispense: 93 tablet; Refill: 1 - CMP14+EGFR  2. Erectile dysfunction, unspecified erectile dysfunction type Orders viagra from [place]  3. Hyperlipidemia Low fat diet - atorvastatin  (LIPITOR) 40 MG tablet; Take 1 tablet (40 mg total) by mouth daily.  Dispense: 93 tablet; Refill: 1 - Lipid panel  4. Depression Stress management - citalopram (CELEXA) 40 MG tablet; Take 1 tablet (40 mg total) by mouth daily.  Dispense: 90 tablet; Refill: 1  5. BMI 28.0-28.9,adult Discussed diet and exercise for person with BMI >25 Will recheck weight in 3-6 months  6. Screening for malignant neoplasm of the rectum - Fecal occult blood, imunochemical; Future  7. Need for hepatitis C screening test - Hepatitis C antibody    Labs pending Health maintenance reviewed Diet and exercise encouraged Continue all meds Follow up  In 3 month   [place], FNP

## 2015-06-28 NOTE — Patient Instructions (Signed)
Bone Health Bones protect organs, store calcium, and anchor muscles. Good health habits, such as eating nutritious foods and exercising regularly, are important for maintaining healthy bones. They can also help to prevent a condition that causes bones to lose density and become weak and brittle (osteoporosis). WHY IS BONE MASS IMPORTANT? Bone mass refers to the amount of bone tissue that you have. The higher your bone mass, the stronger your bones. An important step toward having healthy bones throughout life is to have strong and dense bones during childhood. A young adult who has a high bone mass is more likely to have a high bone mass later in life. Bone mass at its greatest it is called peak bone mass. A large decline in bone mass occurs in older adults. In women, it occurs about the time of menopause. During this time, it is important to practice good health habits, because if more bone is lost than what is replaced, the bones will become less healthy and more likely to break (fracture). If you find that you have a low bone mass, you may be able to prevent osteoporosis or further bone loss by changing your diet and lifestyle. HOW CAN I FIND OUT IF MY BONE MASS IS LOW? Bone mass can be measured with an X-ray test that is called a bone mineral density (BMD) test. This test is recommended for all women who are age 65 or older. It may also be recommended for men who are age 70 or older, or for people who are more likely to develop osteoporosis due to:  Having bones that break easily.  Having a long-term disease that weakens bones, such as kidney disease or rheumatoid arthritis.  Having menopause earlier than normal.  Taking medicine that weakens bones, such as steroids, thyroid hormones, or hormone treatment for breast cancer or prostate cancer.  Smoking.  Drinking three or more alcoholic drinks each day. WHAT ARE THE NUTRITIONAL RECOMMENDATIONS FOR HEALTHY BONES? To have healthy bones, you need  to get enough of the right minerals and vitamins. Most nutrition experts recommend getting these nutrients from the foods that you eat. Nutritional recommendations vary from person to person. Ask your health care provider what is healthy for you. Here are some general guidelines. Calcium Recommendations Calcium is the most important (essential) mineral for bone health. Most people can get enough calcium from their diet, but supplements may be recommended for people who are at risk for osteoporosis. Good sources of calcium include:  Dairy products, such as low-fat or nonfat milk, cheese, and yogurt.  Dark green leafy vegetables, such as bok choy and broccoli.  Calcium-fortified foods, such as orange juice, cereal, bread, soy beverages, and tofu products.  Nuts, such as almonds. Follow these recommended amounts for daily calcium intake:  Children, age 1-3: 700 mg.  Children, age 4-8: 1,000 mg.  Children, age 9-13: 1,300 mg.  Teens, age 14-18: 1,300 mg.  Adults, age 19-50: 1,000 mg.  Adults, age 51-70:  Men: 1,000 mg.  Women: 1,200 mg.  Adults, age 71 or older: 1,200 mg.  Pregnant and breastfeeding females:  Teens: 1,300 mg.  Adults: 1,000 mg. Vitamin D Recommendations Vitamin D is the most essential vitamin for bone health. It helps the body to absorb calcium. Sunlight stimulates the skin to make vitamin D, so be sure to get enough sunlight. If you live in a cold climate or you do not get outside often, your health care provider may recommend that you take vitamin D supplements. Good   sources of vitamin D in your diet include:  Egg yolks.  Saltwater fish.  Milk and cereal fortified with vitamin D. Follow these recommended amounts for daily vitamin D intake:  Children and teens, age 1-18: 600 international units.  Adults, age 50 or younger: 400-800 international units.  Adults, age 51 or older: 800-1,000 international units. Other Nutrients Other nutrients for bone  health include:  Phosphorus. This mineral is found in meat, poultry, dairy foods, nuts, and legumes. The recommended daily intake for adult men and adult women is 700 mg.  Magnesium. This mineral is found in seeds, nuts, dark green vegetables, and legumes. The recommended daily intake for adult men is 400-420 mg. For adult women, it is 310-320 mg.  Vitamin K. This vitamin is found in green leafy vegetables. The recommended daily intake is 120 mg for adult men and 90 mg for adult women. WHAT TYPE OF PHYSICAL ACTIVITY IS BEST FOR BUILDING AND MAINTAINING HEALTHY BONES? Weight-bearing and strength-building activities are important for building and maintaining peak bone mass. Weight-bearing activities cause muscles and bones to work against gravity. Strength-building activities increases muscle strength that supports bones. Weight-bearing and muscle-building activities include:  Walking and hiking.  Jogging and running.  Dancing.  Gym exercises.  Lifting weights.  Tennis and racquetball.  Climbing stairs.  Aerobics. Adults should get at least 30 minutes of moderate physical activity on most days. Children should get at least 60 minutes of moderate physical activity on most days. Ask your health care provide what type of exercise is best for you. WHERE CAN I FIND MORE INFORMATION? For more information, check out the following websites:  National Osteoporosis Foundation: http://nof.org/learn/basics  National Institutes of Health: http://www.niams.nih.gov/Health_Info/Bone/Bone_Health/bone_health_for_life.asp   This information is not intended to replace advice given to you by your health care provider. Make sure you discuss any questions you have with your health care provider.   Document Released: 10/10/2003 Document Revised: 12/04/2014 Document Reviewed: 07/25/2014 Elsevier Interactive Patient Education 2016 Else[employer] Inc.  

## 2015-06-29 LAB — CMP14+EGFR
A/G RATIO: 2 (ref 1.1–2.5)
ALBUMIN: 3.9 g/dL (ref 3.6–4.8)
ALK PHOS: 90 IU/L (ref 39–117)
ALT: 9 IU/L (ref 0–44)
AST: 13 IU/L (ref 0–40)
BILIRUBIN TOTAL: 0.7 mg/dL (ref 0.0–1.2)
BUN / CREAT RATIO: 14 (ref 10–22)
BUN: 21 mg/dL (ref 8–27)
CHLORIDE: 101 mmol/L (ref 97–106)
CO2: 26 mmol/L (ref 18–29)
Calcium: 9 mg/dL (ref 8.6–10.2)
Creatinine, Ser: 1.5 mg/dL — ABNORMAL HIGH (ref 0.76–1.27)
GFR calc non Af Amer: 47 mL/min/{1.73_m2} — ABNORMAL LOW (ref >59)
GFR, EST AFRICAN AMERICAN: 55 mL/min/{1.73_m2} — AB (ref >59)
GLUCOSE: 108 mg/dL — AB (ref 65–99)
Globulin, Total: 2 g/dL (ref 1.5–4.5)
POTASSIUM: 3.7 mmol/L (ref 3.5–5.2)
Sodium: 144 mmol/L (ref 136–144)
TOTAL PROTEIN: 5.9 g/dL — AB (ref 6.0–8.5)

## 2015-06-29 LAB — LIPID PANEL
CHOLESTEROL TOTAL: 108 mg/dL (ref 100–199)
Chol/HDL Ratio: 2.5 ratio units (ref 0.0–5.0)
HDL: 43 mg/dL (ref >39)
LDL Calculated: 50 mg/dL (ref 0–99)
Triglycerides: 75 mg/dL (ref 0–149)
VLDL Cholesterol Cal: 15 mg/dL (ref 5–40)

## 2015-06-29 LAB — HEPATITIS C ANTIBODY: HEP C VIRUS AB: 0.1 {s_co_ratio} (ref 0.0–0.9)

## 2015-07-01 NOTE — Progress Notes (Signed)
Patient aware.

## 2015-07-09 ENCOUNTER — Telehealth: Payer: Self-pay | Admitting: Nurse Practitioner

## 2015-07-12 ENCOUNTER — Other Ambulatory Visit: Payer: Self-pay | Admitting: Nurse Practitioner

## 2015-09-01 DIAGNOSIS — L500 Allergic urticaria: Secondary | ICD-10-CM | POA: Diagnosis not present

## 2015-09-01 DIAGNOSIS — R21 Rash and other nonspecific skin eruption: Secondary | ICD-10-CM | POA: Diagnosis not present

## 2015-09-01 DIAGNOSIS — T7840XA Allergy, unspecified, initial encounter: Secondary | ICD-10-CM | POA: Diagnosis not present

## 2015-09-30 ENCOUNTER — Ambulatory Visit (INDEPENDENT_AMBULATORY_CARE_PROVIDER_SITE_OTHER): Payer: Medicare Other | Admitting: Nurse Practitioner

## 2015-09-30 ENCOUNTER — Encounter: Payer: Self-pay | Admitting: Nurse Practitioner

## 2015-09-30 VITALS — BP 130/76 | HR 60 | Temp 97.0°F | Ht 70.0 in | Wt 199.0 lb

## 2015-09-30 DIAGNOSIS — F329 Major depressive disorder, single episode, unspecified: Secondary | ICD-10-CM | POA: Diagnosis not present

## 2015-09-30 DIAGNOSIS — E785 Hyperlipidemia, unspecified: Secondary | ICD-10-CM | POA: Diagnosis not present

## 2015-09-30 DIAGNOSIS — I10 Essential (primary) hypertension: Secondary | ICD-10-CM

## 2015-09-30 DIAGNOSIS — Z6828 Body mass index (BMI) 28.0-28.9, adult: Secondary | ICD-10-CM

## 2015-09-30 DIAGNOSIS — N529 Male erectile dysfunction, unspecified: Secondary | ICD-10-CM

## 2015-09-30 DIAGNOSIS — F32A Depression, unspecified: Secondary | ICD-10-CM

## 2015-09-30 NOTE — Patient Instructions (Signed)
Stress and Stress Management Stress is a normal reaction to life events. It is what you feel when life demands more than you are used to or more than you can handle. Some stress can be useful. For example, the stress reaction can help you catch the last bus of the day, study for a test, or meet a deadline at work. But stress that occurs too often or for too long can cause problems. It can affect your emotional health and interfere with relationships and normal daily activities. Too much stress can weaken your immune system and increase your risk for physical illness. If you already have a medical problem, stress can make it worse. CAUSES  All sorts of life events may cause stress. An event that causes stress for one person may not be stressful for another person. Major life events commonly cause stress. These may be positive or negative. Examples include losing your job, moving into a new home, getting married, having a baby, or losing a loved one. Less obvious life events may also cause stress, especially if they occur day after day or in combination. Examples include working long hours, driving in traffic, caring for children, being in debt, or being in a difficult relationship. SIGNS AND SYMPTOMS Stress may cause emotional symptoms including, the following:  Anxiety. This is feeling worried, afraid, on edge, overwhelmed, or out of control.  Anger. This is feeling irritated or impatient.  Depression. This is feeling sad, down, helpless, or guilty.  Difficulty focusing, remembering, or making decisions. Stress may cause physical symptoms, including the following:   Aches and pains. These may affect your head, neck, back, stomach, or other areas of your body.  Tight muscles or clenched jaw.  Low energy or trouble sleeping. Stress may cause unhealthy behaviors, including the following:   Eating to feel better (overeating) or skipping meals.  Sleeping too little, too much, or both.  Working  too much or putting off tasks (procrastination).  Smoking, drinking alcohol, or using drugs to feel better. DIAGNOSIS  Stress is diagnosed through an assessment by your health care provider. Your health care provider will ask questions about your symptoms and any stressful life events.Your health care provider will also ask about your medical history and may order blood tests or other tests. Certain medical conditions and medicine can cause physical symptoms similar to stress. Mental illness can cause emotional symptoms and unhealthy behaviors similar to stress. Your health care provider may refer you to a mental health professional for further evaluation.  TREATMENT  Stress management is the recommended treatment for stress.The goals of stress management are reducing stressful life events and coping with stress in healthy ways.  Techniques for reducing stressful life events include the following:  Stress identification. Self-monitor for stress and identify what causes stress for you. These skills may help you to avoid some stressful events.  Time management. Set your priorities, keep a calendar of events, and learn to say "no." These tools can help you avoid making too many commitments. Techniques for coping with stress include the following:  Rethinking the problem. Try to think realistically about stressful events rather than ignoring them or overreacting. Try to find the positives in a stressful situation rather than focusing on the negatives.  Exercise. Physical exercise can release both physical and emotional tension. The key is to find a form of exercise you enjoy and do it regularly.  Relaxation techniques. These relax the body and mind. Examples include yoga, meditation, tai chi, biofeedback, deep  breathing, progressive muscle relaxation, listening to music, being out in nature, journaling, and other hobbies. Again, the key is to find one or more that you enjoy and can do  regularly.  Healthy lifestyle. Eat a balanced diet, get plenty of sleep, and do not smoke. Avoid using alcohol or drugs to relax.  Strong support network. Spend time with family, friends, or other people you enjoy being around.Express your feelings and talk things over with someone you trust. Counseling or talktherapy with a mental health professional may be helpful if you are having difficulty managing stress on your own. Medicine is typically not recommended for the treatment of stress.Talk to your health care provider if you think you need medicine for symptoms of stress. HOME CARE INSTRUCTIONS  Keep all follow-up visits as directed by your health care provider.  Take all medicines as directed by your health care provider. SEEK MEDICAL CARE IF:  Your symptoms get worse or you start having new symptoms.  You feel overwhelmed by your problems and can no longer manage them on your own. SEEK IMMEDIATE MEDICAL CARE IF:  You feel like hurting yourself or someone else.   This information is not intended to replace advice given to you by your health care provider. Make sure you discuss any questions you have with your health care provider.   Document Released: 01/13/2001 Document Revised: 08/10/2014 Document Reviewed: 03/14/2013 Elsevier Interactive Patient Education 2016 Elsevier Inc.  

## 2015-09-30 NOTE — Progress Notes (Signed)
Subjective:    Patient ID: Dean Mcconnell, male    DOB: [birth year], 70 y.o.   MRN: [id number]  Patient here today for follow up of chronic medical problems.  Outpatient Encounter Prescriptions as of 09/30/2015  Medication Sig  . ALPRAZolam (XANAX) 0.25 MG tablet Take 1 tablet (0.25 mg total) by mouth 2 (two) times daily as needed for anxiety.  Marland Kitchen atorvastatin (LIPITOR) 40 MG tablet Take 1 tablet (40 mg total) by mouth daily.  . citalopram (CELEXA) 40 MG tablet Take 1 tablet (40 mg total) by mouth daily.  . cyclobenzaprine (FLEXERIL) 5 MG tablet TAKE 1 TABLET THREE TIMES DAILY AS NEEDED FOR SPASMS  . hydrochlorothiazide (HYDRODIURIL) 25 MG tablet Take 1 tablet (25 mg total) by mouth daily.  Marland Kitchen losartan (COZAAR) 100 MG tablet Take 1 tablet (100 mg total) by mouth daily.  . sildenafil (VIAGRA) 100 MG tablet Take 0.5-1 tablets (50-100 mg total) by mouth daily as needed for erectile dysfunction. (Patient not taking: Reported on 09/30/2015)   No facility-administered encounter medications on file as of 09/30/2015.      Hypertension This is a chronic problem. The current episode started more than 1 year ago. The problem is controlled. Pertinent negatives include no chest pain, headaches or shortness of breath. Risk factors for coronary artery disease include dyslipidemia and male gender. Past treatments include angiotensin blockers and diuretics. The current treatment provides significant improvement. Compliance problems include diet and exercise.  There is no history of CAD/MI or CVA.  Hyperlipidemia This is a chronic problem. The current episode started more than 1 year ago. The problem is controlled. Recent lipid tests were reviewed and are normal. He has no history of diabetes, hypothyroidism or obesity. Pertinent negatives include no chest pain or shortness of breath. Current antihyperlipidemic treatment includes statins. The current treatment provides moderate improvement of lipids. Compliance  problems include adherence to diet and adherence to exercise.  Risk factors for coronary artery disease include dyslipidemia, hypertension and male sex.  erectile dysfunction Uses viagra when he can afford to get. GAD/depression Patient was seen mid July with c/o depression and anxiety and was started on celexa and low dose xanax- he says that he is doing much better- still has occassional episodes where he is "not nice" but for the most part is doing better. No c/o side effects.     Review of Systems  Constitutional: Negative.   HENT: Negative.   Eyes: Negative.   Respiratory: Negative.  Negative for shortness of breath.   Cardiovascular: Negative.  Negative for chest pain.  Gastrointestinal: Negative.   Genitourinary: Negative.        Denies urinary incontinence  Neurological: Negative.  Negative for headaches.  Psychiatric/Behavioral: Negative.   All other systems reviewed and are negative.      Objective:   Physical Exam  Constitutional: He is oriented to person, place, and time. He appears well-developed and well-nourished.  HENT:  Head: Normocephalic.  Right Ear: External ear normal.  Left Ear: External ear normal.  Nose: Nose normal.  Mouth/Throat: Oropharynx is clear and moist.  Eyes: EOM are normal. Pupils are equal, round, and reactive to light.  Neck: Normal range of motion. Neck supple. No JVD present. No thyromegaly present.  Cardiovascular: Normal rate, regular rhythm, normal heart sounds and intact distal pulses.  Exam reveals no gallop and no friction rub.   No murmur heard. Pulmonary/Chest: Effort normal and breath sounds normal. No respiratory distress. He has no wheezes. He has no rales. He  exhibits no tenderness.  Abdominal: Soft. Bowel sounds are normal. He exhibits no mass. There is no tenderness.  Genitourinary:  Refuses rectal exam  Musculoskeletal: Normal range of motion. He exhibits no edema.  Lymphadenopathy:    He has no cervical adenopathy.   Neurological: He is alert and oriented to person, place, and time. No cranial nerve deficit.  Skin: Skin is warm and dry.  Psychiatric: He has a normal mood and affect. His behavior is normal. Judgment and thought content normal.   BP 130/76 mmHg  Pulse 60  Temp(Src) 97 F (36.1 C) (Oral)  Ht '5\' 10"'$  (1.778 m)  Wt 199 lb (90.266 kg)  BMI 28.55 kg/m2       Assessment & Plan:  1. Essential hypertension Do not add salt to diet - CMP14+EGFR  2. Erectile dysfunction, unspecified erectile dysfunction type  3. Hyperlipidemia Low fat diet - Lipid panel  4. Depression Stress management  5. BMI 28.0-28.9,adult Discussed diet and exercise for person with BMI >25 Will recheck weight in 3-6 months     Labs pending Health maintenance reviewed Diet and exercise encouraged Continue all meds Follow up  In 6 months   [place], FNP

## 2015-10-01 LAB — CMP14+EGFR
ALT: 11 IU/L (ref 0–44)
AST: 11 IU/L (ref 0–40)
Albumin/Globulin Ratio: 1.8 (ref 1.1–2.5)
Albumin: 3.6 g/dL (ref 3.6–4.8)
Alkaline Phosphatase: 86 IU/L (ref 39–117)
BILIRUBIN TOTAL: 0.5 mg/dL (ref 0.0–1.2)
BUN/Creatinine Ratio: 13 (ref 10–22)
BUN: 18 mg/dL (ref 8–27)
CALCIUM: 9 mg/dL (ref 8.6–10.2)
CHLORIDE: 100 mmol/L (ref 96–106)
CO2: 26 mmol/L (ref 18–29)
CREATININE: 1.4 mg/dL — AB (ref 0.76–1.27)
GFR calc non Af Amer: 51 mL/min/{1.73_m2} — ABNORMAL LOW (ref >59)
GFR, EST AFRICAN AMERICAN: 59 mL/min/{1.73_m2} — AB (ref >59)
GLUCOSE: 85 mg/dL (ref 65–99)
Globulin, Total: 2 g/dL (ref 1.5–4.5)
Potassium: 4 mmol/L (ref 3.5–5.2)
Sodium: 142 mmol/L (ref 134–144)
TOTAL PROTEIN: 5.6 g/dL — AB (ref 6.0–8.5)

## 2015-10-01 LAB — LIPID PANEL
Chol/HDL Ratio: 2.5 ratio units (ref 0.0–5.0)
Cholesterol, Total: 120 mg/dL (ref 100–199)
HDL: 48 mg/dL (ref >39)
LDL CALC: 57 mg/dL (ref 0–99)
Triglycerides: 75 mg/dL (ref 0–149)
VLDL Cholesterol Cal: 15 mg/dL (ref 5–40)

## 2016-01-13 ENCOUNTER — Other Ambulatory Visit: Payer: Self-pay | Admitting: Nurse Practitioner

## 2016-01-14 NOTE — Telephone Encounter (Signed)
Last seen 09/30/15 MMM Requesting 90 day supply

## 2016-03-21 ENCOUNTER — Other Ambulatory Visit: Payer: Self-pay | Admitting: Nurse Practitioner

## 2016-03-21 DIAGNOSIS — I10 Essential (primary) hypertension: Secondary | ICD-10-CM

## 2016-03-21 DIAGNOSIS — E785 Hyperlipidemia, unspecified: Secondary | ICD-10-CM

## 2016-03-30 ENCOUNTER — Encounter: Payer: Self-pay | Admitting: Nurse Practitioner

## 2016-03-30 ENCOUNTER — Telehealth: Payer: Self-pay | Admitting: Nurse Practitioner

## 2016-03-30 ENCOUNTER — Ambulatory Visit (INDEPENDENT_AMBULATORY_CARE_PROVIDER_SITE_OTHER): Payer: Medicare Other | Admitting: Nurse Practitioner

## 2016-03-30 VITALS — BP 110/63 | HR 81 | Temp 97.0°F | Ht 70.0 in | Wt 204.0 lb

## 2016-03-30 DIAGNOSIS — M7552 Bursitis of left shoulder: Secondary | ICD-10-CM | POA: Diagnosis not present

## 2016-03-30 DIAGNOSIS — I10 Essential (primary) hypertension: Secondary | ICD-10-CM

## 2016-03-30 DIAGNOSIS — Z6828 Body mass index (BMI) 28.0-28.9, adult: Secondary | ICD-10-CM | POA: Diagnosis not present

## 2016-03-30 DIAGNOSIS — E785 Hyperlipidemia, unspecified: Secondary | ICD-10-CM

## 2016-03-30 DIAGNOSIS — N529 Male erectile dysfunction, unspecified: Secondary | ICD-10-CM

## 2016-03-30 DIAGNOSIS — F329 Major depressive disorder, single episode, unspecified: Secondary | ICD-10-CM | POA: Diagnosis not present

## 2016-03-30 DIAGNOSIS — F32A Depression, unspecified: Secondary | ICD-10-CM

## 2016-03-30 LAB — CMP14+EGFR
A/G RATIO: 1.9 (ref 1.2–2.2)
ALT: 14 IU/L (ref 0–44)
AST: 15 IU/L (ref 0–40)
Albumin: 4.2 g/dL (ref 3.6–4.8)
Alkaline Phosphatase: 103 IU/L (ref 39–117)
BUN/Creatinine Ratio: 10 (ref 10–24)
BUN: 17 mg/dL (ref 8–27)
Bilirubin Total: 0.6 mg/dL (ref 0.0–1.2)
CALCIUM: 9.6 mg/dL (ref 8.6–10.2)
CO2: 27 mmol/L (ref 18–29)
Chloride: 99 mmol/L (ref 96–106)
Creatinine, Ser: 1.75 mg/dL — ABNORMAL HIGH (ref 0.76–1.27)
GFR calc Af Amer: 45 mL/min/{1.73_m2} — ABNORMAL LOW (ref >59)
GFR, EST NON AFRICAN AMERICAN: 39 mL/min/{1.73_m2} — AB (ref >59)
Globulin, Total: 2.2 g/dL (ref 1.5–4.5)
Glucose: 104 mg/dL — ABNORMAL HIGH (ref 65–99)
POTASSIUM: 3.9 mmol/L (ref 3.5–5.2)
Sodium: 142 mmol/L (ref 134–144)
Total Protein: 6.4 g/dL (ref 6.0–8.5)

## 2016-03-30 LAB — LIPID PANEL
CHOL/HDL RATIO: 2.8 ratio (ref 0.0–5.0)
CHOLESTEROL TOTAL: 133 mg/dL (ref 100–199)
HDL: 47 mg/dL (ref >39)
LDL Calculated: 68 mg/dL (ref 0–99)
TRIGLYCERIDES: 91 mg/dL (ref 0–149)
VLDL Cholesterol Cal: 18 mg/dL (ref 5–40)

## 2016-03-30 MED ORDER — TRAMADOL HCL 50 MG PO TABS: 50.0000 mg | ORAL_TABLET | Freq: Three times a day (TID) | ORAL | 1 refills | Status: DC | PRN

## 2016-03-30 NOTE — Progress Notes (Signed)
Subjective:    Patient ID: Dean Mcconnell, male    DOB: 03-10-1946, 70 y.o.   MRN: [id number]  Patient here today for follow up of chronic medical problems. No changes since last visit.   Outpatient Encounter Prescriptions as of 03/30/2016  Medication Sig  . ALPRAZolam (XANAX) 0.25 MG tablet Take 1 tablet (0.25 mg total) by mouth 2 (two) times daily as needed for anxiety.  Marland Kitchen atorvastatin (LIPITOR) 40 MG tablet TAKE 1 TABLET DAILY  . cyclobenzaprine (FLEXERIL) 5 MG tablet TAKE 1 TABLET THREE TIMES DAILY AS NEEDED FOR SPASMS  . hydrochlorothiazide (HYDRODIURIL) 25 MG tablet TAKE 1 TABLET DAILY  . losartan (COZAAR) 100 MG tablet TAKE 1 TABLET DAILY  . sildenafil (VIAGRA) 100 MG tablet Take 0.5-1 tablets (50-100 mg total) by mouth daily as needed for erectile dysfunction.    * has history of left shoulder bursitis that flares up from time to time- would like some pain meds to help with pain at times.  Hypertension  This is a chronic problem. The current episode started more than 1 year ago. The problem is controlled. Pertinent negatives include no chest pain, headaches or shortness of breath. Risk factors for coronary artery disease include dyslipidemia and male gender. Past treatments include angiotensin blockers and diuretics. The current treatment provides significant improvement. Compliance problems include diet and exercise.  There is no history of CAD/MI or CVA.  Hyperlipidemia  This is a chronic problem. The current episode started more than 1 year ago. The problem is controlled. Recent lipid tests were reviewed and are normal. He has no history of diabetes, hypothyroidism or obesity. Pertinent negatives include no chest pain or shortness of breath. Current antihyperlipidemic treatment includes statins. The current treatment provides moderate improvement of lipids. Compliance problems include adherence to diet and adherence to exercise.  Risk factors for coronary artery disease include  dyslipidemia, hypertension and male sex.  erectile dysfunction Uses viagra when he can afford to get. GAD/depression Patient was seen mid July with c/o depression and anxiety and was started on celexa and low dose xanax-  He stopped taking celexa 3 months ago - says he does not feel any different on it or off of it.    Review of Systems  Constitutional: Negative.   HENT: Negative.   Eyes: Negative.   Respiratory: Negative.  Negative for shortness of breath.   Cardiovascular: Negative.  Negative for chest pain.  Gastrointestinal: Negative.   Genitourinary: Negative.        Denies urinary incontinence  Neurological: Negative.  Negative for headaches.  Psychiatric/Behavioral: Negative.   All other systems reviewed and are negative.      Objective:   Physical Exam  Constitutional: He is oriented to person, place, and time. He appears well-developed and well-nourished.  HENT:  Head: Normocephalic.  Right Ear: External ear normal.  Left Ear: External ear normal.  Nose: Nose normal.  Mouth/Throat: Oropharynx is clear and moist.  Eyes: EOM are normal. Pupils are equal, round, and reactive to light.  Neck: Normal range of motion. Neck supple. No JVD present. No thyromegaly present.  Cardiovascular: Normal rate, regular rhythm, normal heart sounds and intact distal pulses.  Exam reveals no gallop and no friction rub.   No murmur heard. Pulmonary/Chest: Effort normal and breath sounds normal. No respiratory distress. He has no wheezes. He has no rales. He exhibits no tenderness.  Abdominal: Soft. Bowel sounds are normal. He exhibits no mass. There is no tenderness.  Genitourinary:  Genitourinary Comments: Refuses  rectal exam  Musculoskeletal: Normal range of motion. He exhibits no edema.  Lymphadenopathy:    He has no cervical adenopathy.  Neurological: He is alert and oriented to person, place, and time. No cranial nerve deficit.  Skin: Skin is warm and dry.  Psychiatric: He has a  normal mood and affect. His behavior is normal. Judgment and thought content normal.   BP 110/63   Pulse 81   Temp 97 F (36.1 C) (Oral)   Ht _0  (1.778 m)   Wt 204 lb (92.5 kg)   BMI 29.27 kg/m        Assessment & Plan:  1. Essential hypertension Do not add salt to diet - CMP14+EGFR  2. Erectile dysfunction, unspecified erectile dysfunction type  3. BMI 28.0-28.9,adult Discussed diet and exercise for person with BMI >25 Will recheck weight in 3-6 months  4. Hyperlipidemia Low fta diet - Lipid panel  5. Depression Stress management  6. Chronic bursitis of left shoulder - traMADol (ULTRAM) 50 MG tablet; Take 1 tablet (50 mg total) by mouth every 8 (eight) hours as needed.  Dispense: 30 tablet; Refill: 1    Labs pending Health maintenance reviewed Diet and exercise encouraged Continue all meds Follow up  In 3 month   [place], FNP

## 2016-03-30 NOTE — Patient Instructions (Signed)
Bursitis °Bursitis is inflammation and irritation of a bursa, which is one of the small, fluid-filled sacs that cushion and protect the moving parts of your body. These sacs are located between bones and muscles, muscle attachments, or skin areas next to bones. A bursa protects these structures from the wear and tear that results from frequent movement. °An inflamed bursa causes pain and swelling. Fluid may build up inside the sac. Bursitis is most common near joints, especially the knees, elbows, hips, and shoulders. °CAUSES °Bursitis can be caused by:  °· Injury from: °¨ A direct blow, like falling on your knee or elbow. °¨ Overuse of a joint (repetitive stress). °· Infection. This can happen if bacteria gets into a bursa through a cut or scrape near a joint. °· Diseases that cause joint inflammation, such as gout and rheumatoid arthritis. °RISK FACTORS °You may be at risk for bursitis if you:  °· Have a job or hobby that involves a lot of repetitive stress on your joints. °· Have a condition that weakens your body's defense system (immune system), such as diabetes, cancer, or HIV. °· Lift and reach overhead often. °· Kneel or lean on hard surfaces often. °· Run or walk often. °SIGNS AND SYMPTOMS °The most common signs and symptoms of bursitis are: °· Pain that gets worse when you move the affected body part or put weight on it. °· Inflammation. °· Stiffness. °Other signs and symptoms may include: °· Redness. °· Tenderness. °· Warmth. °· Pain that continues after rest. °· Fever and chills. This may occur in bursitis caused by infection. °DIAGNOSIS °Bursitis may be diagnosed by:  °· Medical history and physical exam. °· MRI. °· A procedure to drain fluid from the bursa with a needle (aspiration). The fluid may be checked for signs of infection or gout. °· Blood tests to rule out other causes of inflammation. °TREATMENT  °Bursitis can usually be treated at home with rest, ice, compression, and elevation (RICE). For  mild bursitis, RICE treatment may be all you need. Other treatments may include: °· Nonsteroidal anti-inflammatory drugs (NSAIDs) to treat pain and inflammation. °· Corticosteroids to fight inflammation. You may have these drugs injected into and around the area of bursitis. °· Aspiration of bursitis fluid to relieve pain and improve movement. °· Antibiotic medicine to treat an infected bursa. °· A splint, brace, or walking aid. °· Physical therapy if you continue to have pain or limited movement. °· Surgery to remove a damaged or infected bursa. This may be needed if you have a very bad case of bursitis or if other treatments have not worked. °HOME CARE INSTRUCTIONS  °· Take medicines only as directed by your health care provider. °· If you were prescribed an antibiotic medicine, finish it all even if you start to feel better. °· Rest the affected area as directed by your health care provider. °¨ Keep the area elevated. °¨ Avoid activities that make pain worse. °· Apply ice to the injured area: °¨ Place ice in a plastic bag. °¨ Place a towel between your skin and the bag. °¨ Leave the ice on for 20 minutes, 2-3 times a day. °· Use splints, braces, pads, or walking aids as directed by your health care provider. °· Keep all follow-up visits as directed by your health care provider. This is important. °PREVENTION  °· Wear knee pads if you kneel often. °· Wear sturdy running or walking shoes that fit you well. °· Take regular breaks from repetitive activity. °· Warm   up by stretching before doing any strenuous activity. °· Maintain a healthy weight or lose weight as recommended by your health care provider. Ask your health care provider if you need help. °· Exercise regularly. Start any new physical activity gradually. °SEEK MEDICAL CARE IF:  °· Your bursitis is not responding to treatment or home care. °· You have a fever. °· You have chills. °  °This information is not intended to replace advice given to you by your  health care provider. Make sure you discuss any questions you have with your health care provider. °  °Document Released: 07/17/2000 Document Revised: 04/10/2015 Document Reviewed: 10/09/2013 °Elsevier Interactive Patient Education ©2016 [employer] ° °

## 2016-03-31 ENCOUNTER — Telehealth: Payer: Self-pay | Admitting: *Deleted

## 2016-03-31 NOTE — Telephone Encounter (Signed)
Pt notified of results Verbalizes understanding 

## 2016-03-31 NOTE — Telephone Encounter (Signed)
Pt notified of MMM recommendation Verbalizes understanding  

## 2016-03-31 NOTE — Telephone Encounter (Signed)
-----   Message from [facility], [place] sent at 03/31/2016  8:01 AM EDT ----- Kidney and liver function stable Creatine is increasing avoid NSAID and we will continue to watch Cholesterol looks good Continue current meds- low fat diet and exercise and recheck in 3 months

## 2016-03-31 NOTE — Telephone Encounter (Signed)
Do not want him on NSAID because his creatine is increasing and they will be to hard on kidneys- can go see ortho for steroid injection.

## 2016-06-15 ENCOUNTER — Other Ambulatory Visit: Payer: Self-pay | Admitting: Nurse Practitioner

## 2016-06-15 DIAGNOSIS — E785 Hyperlipidemia, unspecified: Secondary | ICD-10-CM

## 2016-06-15 DIAGNOSIS — I10 Essential (primary) hypertension: Secondary | ICD-10-CM

## 2016-07-07 ENCOUNTER — Ambulatory Visit: Payer: Medicare Other | Admitting: Nurse Practitioner

## 2016-07-08 ENCOUNTER — Encounter: Payer: Self-pay | Admitting: Nurse Practitioner

## 2016-07-14 ENCOUNTER — Ambulatory Visit (INDEPENDENT_AMBULATORY_CARE_PROVIDER_SITE_OTHER): Payer: Medicare Other | Admitting: Nurse Practitioner

## 2016-07-14 ENCOUNTER — Encounter: Payer: Self-pay | Admitting: Nurse Practitioner

## 2016-07-14 VITALS — BP 139/78 | HR 82 | Temp 96.9°F | Ht 70.0 in | Wt 209.0 lb

## 2016-07-14 DIAGNOSIS — Z6828 Body mass index (BMI) 28.0-28.9, adult: Secondary | ICD-10-CM

## 2016-07-14 DIAGNOSIS — F325 Major depressive disorder, single episode, in full remission: Secondary | ICD-10-CM

## 2016-07-14 DIAGNOSIS — E782 Mixed hyperlipidemia: Secondary | ICD-10-CM | POA: Diagnosis not present

## 2016-07-14 DIAGNOSIS — I10 Essential (primary) hypertension: Secondary | ICD-10-CM | POA: Diagnosis not present

## 2016-07-14 MED ORDER — LOSARTAN POTASSIUM 100 MG PO TABS: 100.0000 mg | ORAL_TABLET | Freq: Every day | ORAL | 1 refills | Status: DC

## 2016-07-14 MED ORDER — HYDROCHLOROTHIAZIDE 25 MG PO TABS: 25.0000 mg | ORAL_TABLET | Freq: Every day | ORAL | 1 refills | Status: DC

## 2016-07-14 MED ORDER — ATORVASTATIN CALCIUM 40 MG PO TABS: 40.0000 mg | ORAL_TABLET | Freq: Every day | ORAL | 1 refills | Status: DC

## 2016-07-14 NOTE — Patient Instructions (Signed)

## 2016-07-14 NOTE — Progress Notes (Signed)
Subjective:    Patient ID: Dean Mcconnell, male    DOB: 1945/09/29, 70 y.o.   MRN: [id number]  Patient here today for follow up of chronic medical problems. No changes since last visit.   Outpatient Encounter Prescriptions as of 03/30/2016  Medication Sig  . ALPRAZolam (XANAX) 0.25 MG tablet Take 1 tablet (0.25 mg total) by mouth 2 (two) times daily as needed for anxiety.  Marland Kitchen atorvastatin (LIPITOR) 40 MG tablet TAKE 1 TABLET DAILY  . cyclobenzaprine (FLEXERIL) 5 MG tablet TAKE 1 TABLET THREE TIMES DAILY AS NEEDED FOR SPASMS  . hydrochlorothiazide (HYDRODIURIL) 25 MG tablet TAKE 1 TABLET DAILY  . losartan (COZAAR) 100 MG tablet TAKE 1 TABLET DAILY  . sildenafil (VIAGRA) 100 MG tablet Take 0.5-1 tablets (50-100 mg total) by mouth daily as needed for erectile dysfunction.     Hypertension  This is a chronic problem. The current episode started more than 1 year ago. The problem is controlled. Pertinent negatives include no chest pain, headaches or shortness of breath. Risk factors for coronary artery disease include dyslipidemia and male gender. Past treatments include angiotensin blockers and diuretics. The current treatment provides significant improvement. Compliance problems include diet and exercise.  There is no history of CAD/MI or CVA.  Hyperlipidemia  This is a chronic problem. The current episode started more than 1 year ago. The problem is controlled. Recent lipid tests were reviewed and are normal. He has no history of diabetes, hypothyroidism or obesity. Pertinent negatives include no chest pain or shortness of breath. Current antihyperlipidemic treatment includes statins. The current treatment provides moderate improvement of lipids. Compliance problems include adherence to diet and adherence to exercise.  Risk factors for coronary artery disease include dyslipidemia, hypertension and male sex.  erectile dysfunction Uses viagra when he can afford to get. GAD/depression Patient was seen  mid July with c/o depression and anxiety and was started on celexa and low dose xanax-  He stopped taking celexa 3 months ago - says he does not feel any different on it or off of it. Depression screen [facility] 2/9 07/14/2016 03/30/2016 03/21/2015 12/24/2014 11/29/2014  Decreased Interest 0 0 0 2 1  Down, Depressed, Hopeless 0 0 0 2 1  PHQ - 2 Score 0 0 0 4 2  Altered sleeping - - - 2 1  Tired, decreased energy - - - 0 0  Change in appetite - - - 0 0  Feeling bad or failure about yourself  - - - 2 0  Trouble concentrating - - - 2 0  Moving slowly or fidgety/restless - - - 0 0  Suicidal thoughts - - - 0 0  PHQ-9 Score - - - 10 3  Difficult doing work/chores - - - - Not difficult at all     Review of Systems  Constitutional: Negative.   HENT: Negative.   Eyes: Negative.   Respiratory: Negative.  Negative for shortness of breath.   Cardiovascular: Negative.  Negative for chest pain.  Gastrointestinal: Negative.   Genitourinary: Negative.        Denies urinary incontinence  Neurological: Negative.  Negative for headaches.  Psychiatric/Behavioral: Negative.   All other systems reviewed and are negative.      Objective:   Physical Exam  Constitutional: He is oriented to person, place, and time. He appears well-developed and well-nourished.  HENT:  Head: Normocephalic.  Right Ear: External ear normal.  Left Ear: External ear normal.  Nose: Nose normal.  Mouth/Throat: Oropharynx is clear and moist.  Eyes: EOM are normal. Pupils are equal, round, and reactive to light.  Neck: Normal range of motion. Neck supple. No JVD present. No thyromegaly present.  Cardiovascular: Normal rate, regular rhythm, normal heart sounds and intact distal pulses.  Exam reveals no gallop and no friction rub.   No murmur heard. Pulmonary/Chest: Effort normal and breath sounds normal. No respiratory distress. He has no wheezes. He has no rales. He exhibits no tenderness.  Abdominal: Soft. Bowel sounds are normal.  He exhibits no mass. There is no tenderness.  Genitourinary:  Genitourinary Comments: Refuses rectal exam  Musculoskeletal: Normal range of motion. He exhibits no edema.  Lymphadenopathy:    He has no cervical adenopathy.  Neurological: He is alert and oriented to person, place, and time. No cranial nerve deficit.  Skin: Skin is warm and dry.  Psychiatric: He has a normal mood and affect. His behavior is normal. Judgment and thought content normal.   BP 139/78   Pulse 82   Temp (!) 96.9 F (36.1 C) (Oral)   Ht '5\' 10"'$  (1.778 m)   Wt 209 lb (94.8 kg)   BMI 29.99 kg/m        Assessment & Plan:  1. Essential hypertension Low sodium diet - losartan (COZAAR) 100 MG tablet; Take 1 tablet (100 mg total) by mouth daily.  Dispense: 90 tablet; Refill: 1 - hydrochlorothiazide (HYDRODIURIL) 25 MG tablet; Take 1 tablet (25 mg total) by mouth daily.  Dispense: 90 tablet; Refill: 1 - CMP14+EGFR  2. BMI 28.0-28.9,adult Discussed diet and exercise for person with BMI >25 Will recheck weight in 3-6 months  3. Major depressive disorder with single episode, in full remission [facility]) Stress management  4. Mixed hyperlipidemia Low fat diet - atorvastatin (LIPITOR) 40 MG tablet; Take 1 tablet (40 mg total) by mouth daily.  Dispense: 90 tablet; Refill: 1 - Lipid panel   hemoccult cards given to patient with directions Labs pending Health maintenance reviewed Diet and exercise encouraged Continue all meds Follow up  In 3 month   [place], FNP

## 2016-07-15 LAB — LIPID PANEL
CHOLESTEROL TOTAL: 126 mg/dL (ref 100–199)
Chol/HDL Ratio: 2.6 ratio units (ref 0.0–5.0)
HDL: 49 mg/dL (ref >39)
LDL Calculated: 58 mg/dL (ref 0–99)
Triglycerides: 96 mg/dL (ref 0–149)
VLDL Cholesterol Cal: 19 mg/dL (ref 5–40)

## 2016-07-15 LAB — CMP14+EGFR
A/G RATIO: 1.9 (ref 1.2–2.2)
ALK PHOS: 103 IU/L (ref 39–117)
ALT: 14 IU/L (ref 0–44)
AST: 13 IU/L (ref 0–40)
Albumin: 4.2 g/dL (ref 3.6–4.8)
BILIRUBIN TOTAL: 0.4 mg/dL (ref 0.0–1.2)
BUN / CREAT RATIO: 11 (ref 10–24)
BUN: 17 mg/dL (ref 8–27)
CHLORIDE: 99 mmol/L (ref 96–106)
CO2: 31 mmol/L — ABNORMAL HIGH (ref 18–29)
Calcium: 9.3 mg/dL (ref 8.6–10.2)
Creatinine, Ser: 1.59 mg/dL — ABNORMAL HIGH (ref 0.76–1.27)
GFR calc non Af Amer: 44 mL/min/{1.73_m2} — ABNORMAL LOW (ref >59)
GFR, EST AFRICAN AMERICAN: 50 mL/min/{1.73_m2} — AB (ref >59)
Globulin, Total: 2.2 g/dL (ref 1.5–4.5)
Glucose: 120 mg/dL — ABNORMAL HIGH (ref 65–99)
POTASSIUM: 3.9 mmol/L (ref 3.5–5.2)
Sodium: 143 mmol/L (ref 134–144)
TOTAL PROTEIN: 6.4 g/dL (ref 6.0–8.5)

## 2016-07-17 ENCOUNTER — Telehealth: Payer: Self-pay | Admitting: *Deleted

## 2016-08-01 ENCOUNTER — Other Ambulatory Visit: Payer: Self-pay | Admitting: Nurse Practitioner

## 2016-08-01 DIAGNOSIS — M7552 Bursitis of left shoulder: Secondary | ICD-10-CM

## 2016-08-13 ENCOUNTER — Encounter: Payer: Self-pay | Admitting: Nurse Practitioner

## 2016-08-13 ENCOUNTER — Ambulatory Visit (INDEPENDENT_AMBULATORY_CARE_PROVIDER_SITE_OTHER): Payer: Medicare Other | Admitting: Nurse Practitioner

## 2016-08-13 VITALS — BP 134/73 | HR 91 | Temp 96.9°F | Ht 70.0 in | Wt 208.0 lb

## 2016-08-13 DIAGNOSIS — F411 Generalized anxiety disorder: Secondary | ICD-10-CM | POA: Diagnosis not present

## 2016-08-13 MED ORDER — CLONAZEPAM 0.5 MG PO TABS: 0.5000 mg | ORAL_TABLET | Freq: Two times a day (BID) | ORAL | 1 refills | Status: DC | PRN

## 2016-08-13 MED ORDER — ESCITALOPRAM OXALATE 10 MG PO TABS: 10.0000 mg | ORAL_TABLET | Freq: Every day | ORAL | 5 refills | Status: DC

## 2016-08-13 NOTE — Patient Instructions (Signed)
Stress and Stress Management Stress is a normal reaction to life events. It is what you feel when life demands more than you are used to or more than you can handle. Some stress can be useful. For example, the stress reaction can help you catch the last bus of the day, study for a test, or meet a deadline at work. But stress that occurs too often or for too long can cause problems. It can affect your emotional health and interfere with relationships and normal daily activities. Too much stress can weaken your immune system and increase your risk for physical illness. If you already have a medical problem, stress can make it worse. What are the causes? All sorts of life events may cause stress. An event that causes stress for one person may not be stressful for another person. Major life events commonly cause stress. These may be positive or negative. Examples include losing your job, moving into a new home, getting married, having a baby, or losing a loved one. Less obvious life events may also cause stress, especially if they occur day after day or in combination. Examples include working long hours, driving in traffic, caring for children, being in debt, or being in a difficult relationship. What are the signs or symptoms? Stress may cause emotional symptoms including, the following:  Anxiety. This is feeling worried, afraid, on edge, overwhelmed, or out of control.  Anger. This is feeling irritated or impatient.  Depression. This is feeling sad, down, helpless, or guilty.  Difficulty focusing, remembering, or making decisions. Stress may cause physical symptoms, including the following:  Aches and pains. These may affect your head, neck, back, stomach, or other areas of your body.  Tight muscles or clenched jaw.  Low energy or trouble sleeping. Stress may cause unhealthy behaviors, including the following:  Eating to feel better (overeating) or skipping meals.  Sleeping too little, too  much, or both.  Working too much or putting off tasks (procrastination).  Smoking, drinking alcohol, or using drugs to feel better. How is this diagnosed? Stress is diagnosed through an assessment by your health care provider. Your health care provider will ask questions about your symptoms and any stressful life events.Your health care provider will also ask about your medical history and may order blood tests or other tests. Certain medical conditions and medicine can cause physical symptoms similar to stress. Mental illness can cause emotional symptoms and unhealthy behaviors similar to stress. Your health care provider may refer you to a mental health professional for further evaluation. How is this treated? Stress management is the recommended treatment for stress.The goals of stress management are reducing stressful life events and coping with stress in healthy ways. Techniques for reducing stressful life events include the following:  Stress identification. Self-monitor for stress and identify what causes stress for you. These skills may help you to avoid some stressful events.  Time management. Set your priorities, keep a calendar of events, and learn to say "no." These tools can help you avoid making too many commitments. Techniques for coping with stress include the following:  Rethinking the problem. Try to think realistically about stressful events rather than ignoring them or overreacting. Try to find the positives in a stressful situation rather than focusing on the negatives.  Exercise. Physical exercise can release both physical and emotional tension. The key is to find a form of exercise you enjoy and do it regularly.  Relaxation techniques. These relax the body and mind. Examples include yoga,  meditation, tai chi, biofeedback, deep breathing, progressive muscle relaxation, listening to music, being out in nature, journaling, and other hobbies. Again, the key is to find one or  more that you enjoy and can do regularly.  Healthy lifestyle. Eat a balanced diet, get plenty of sleep, and do not smoke. Avoid using alcohol or drugs to relax.  Strong support network. Spend time with family, friends, or other people you enjoy being around.Express your feelings and talk things over with someone you trust. Counseling or talktherapy with a mental health professional may be helpful if you are having difficulty managing stress on your own. Medicine is typically not recommended for the treatment of stress.Talk to your health care provider if you think you need medicine for symptoms of stress. Follow these instructions at home:  Keep all follow-up visits as directed by your health care provider.  Take all medicines as directed by your health care provider. Contact a health care provider if:  Your symptoms get worse or you start having new symptoms.  You feel overwhelmed by your problems and can no longer manage them on your own. Get help right away if:  You feel like hurting yourself or someone else. This information is not intended to replace advice given to you by your health care provider. Make sure you discuss any questions you have with your health care provider. Document Released: 01/13/2001 Document Revised: 12/26/2015 Document Reviewed: 03/14/2013 Elsevier Interactive Patient Education  2017 [employer].

## 2016-08-13 NOTE — Progress Notes (Signed)
   Subjective:    Patient ID: Dean Mcconnell, male    DOB: August 02, 1946, 71 y.o.   MRN: [id number]  HPI  Patient comes in today c/o anxiety- says he is unable to sleep or eat. He is having personal issues- live in girlfriend moved out on Sunday and he is having a hard time dealing with it.   Review of Systems  Constitutional: Negative.   HENT: Negative.   Respiratory: Negative.   Cardiovascular: Negative.   Genitourinary: Negative.   Neurological: Negative.   Psychiatric/Behavioral: Positive for dysphoric mood. The patient is nervous/anxious.   All other systems reviewed and are negative.      Objective:   Physical Exam  Constitutional: He is oriented to person, place, and time. He appears well-developed and well-nourished. No distress.  Cardiovascular: Normal rate and regular rhythm.   Pulmonary/Chest: Effort normal and breath sounds normal.  Neurological: He is alert and oriented to person, place, and time.  Skin: Skin is warm.  Psychiatric: He has a normal mood and affect. His behavior is normal. Judgment and thought content normal.  Tearful during exam   BP 134/73   Pulse 91   Temp (!) 96.9 F (36.1 C) (Oral)   Ht 5\' 10"  (1.778 m)   Wt 208 lb (94.3 kg)   BMI 29.84 kg/m        Assessment & Plan:  1. GAD (generalized anxiety disorder) Stress management Follow up in 3 weeks or sooner if needed - escitalopram (LEXAPRO) 10 MG tablet; Take 1 tablet (10 mg total) by mouth daily.  Dispense: 30 tablet; Refill: 5 - clonazePAM (KLONOPIN) 0.5 MG tablet; Take 1 tablet (0.5 mg total) by mouth 2 (two) times daily as needed for anxiety.  Dispense: 60 tablet; Refill: [place], FNP

## 2016-09-03 ENCOUNTER — Ambulatory Visit: Payer: Medicare Other | Admitting: Nurse Practitioner

## 2016-09-07 ENCOUNTER — Encounter: Payer: Self-pay | Admitting: Nurse Practitioner

## 2016-10-20 ENCOUNTER — Ambulatory Visit (INDEPENDENT_AMBULATORY_CARE_PROVIDER_SITE_OTHER): Payer: Medicare Other | Admitting: Nurse Practitioner

## 2016-10-20 ENCOUNTER — Encounter: Payer: Self-pay | Admitting: Nurse Practitioner

## 2016-10-20 VITALS — BP 122/69 | HR 66 | Temp 96.7°F | Ht 70.0 in | Wt 207.0 lb

## 2016-10-20 DIAGNOSIS — Z125 Encounter for screening for malignant neoplasm of prostate: Secondary | ICD-10-CM

## 2016-10-20 DIAGNOSIS — F411 Generalized anxiety disorder: Secondary | ICD-10-CM | POA: Diagnosis not present

## 2016-10-20 DIAGNOSIS — I10 Essential (primary) hypertension: Secondary | ICD-10-CM

## 2016-10-20 DIAGNOSIS — E782 Mixed hyperlipidemia: Secondary | ICD-10-CM

## 2016-10-20 MED ORDER — ESCITALOPRAM OXALATE 10 MG PO TABS: 10.0000 mg | ORAL_TABLET | Freq: Every day | ORAL | 5 refills | Status: DC

## 2016-10-20 MED ORDER — HYDROCHLOROTHIAZIDE 25 MG PO TABS: 25.0000 mg | ORAL_TABLET | Freq: Every day | ORAL | 1 refills | Status: DC

## 2016-10-20 MED ORDER — ATORVASTATIN CALCIUM 40 MG PO TABS: 40.0000 mg | ORAL_TABLET | Freq: Every day | ORAL | 1 refills | Status: DC

## 2016-10-20 MED ORDER — LOSARTAN POTASSIUM 100 MG PO TABS: 100.0000 mg | ORAL_TABLET | Freq: Every day | ORAL | 1 refills | Status: DC

## 2016-10-20 NOTE — Addendum Note (Signed)
Addended by: Rolena Infante on: 10/20/2016 10:58 AM   Modules accepted: Orders

## 2016-10-20 NOTE — Progress Notes (Signed)
Subjective:    Patient ID: Dean Mcconnell, male    DOB: Nov 29, 1945, 71 y.o.   MRN: [id number]  Patient here today for follow up of chronic medical problems. No changes since last visit.   Outpatient Encounter Prescriptions as of 03/30/2016  Medication Sig  . ALPRAZolam (XANAX) 0.25 MG tablet Take 1 tablet (0.25 mg total) by mouth 2 (two) times daily as needed for anxiety.  Marland Kitchen atorvastatin (LIPITOR) 40 MG tablet TAKE 1 TABLET DAILY  . cyclobenzaprine (FLEXERIL) 5 MG tablet TAKE 1 TABLET THREE TIMES DAILY AS NEEDED FOR SPASMS  . hydrochlorothiazide (HYDRODIURIL) 25 MG tablet TAKE 1 TABLET DAILY  . losartan (COZAAR) 100 MG tablet TAKE 1 TABLET DAILY  . sildenafil (VIAGRA) 100 MG tablet Take 0.5-1 tablets (50-100 mg total) by mouth daily as needed for erectile dysfunction.     Hypertension  This is a chronic problem. The current episode started more than 1 year ago. The problem is controlled. Pertinent negatives include no chest pain, headaches or shortness of breath. Risk factors for coronary artery disease include dyslipidemia and male gender. Past treatments include angiotensin blockers and diuretics. The current treatment provides significant improvement. Compliance problems include diet and exercise.  There is no history of CAD/MI or CVA.  Hyperlipidemia  This is a chronic problem. The current episode started more than 1 year ago. The problem is controlled. Recent lipid tests were reviewed and are normal. He has no history of diabetes, hypothyroidism or obesity. Pertinent negatives include no chest pain or shortness of breath. Current antihyperlipidemic treatment includes statins. The current treatment provides moderate improvement of lipids. Compliance problems include adherence to diet and adherence to exercise.  Risk factors for coronary artery disease include dyslipidemia, hypertension and male sex.  erectile dysfunction Uses viagra when he can afford to get. GAD/depression Was seen 08/13/16  with anxiety- his girlfriend moved out and he was having a hard time- he had stopped his celexa several months before that because he felt no different on it. He was put on lexapro and was given a short course of klonopin BID. He seemd to be doing much better. Depression screen [facility] 2/9 10/20/2016 08/13/2016 07/14/2016 03/30/2016 03/21/2015  Decreased Interest 2 0 0 0 0  Down, Depressed, Hopeless 2 0 0 0 0  PHQ - 2 Score 4 0 0 0 0  Altered sleeping 1 - - - -  Tired, decreased energy 0 - - - -  Change in appetite 2 - - - -  Feeling bad or failure about yourself  0 - - - -  Trouble concentrating 2 - - - -  Moving slowly or fidgety/restless 0 - - - -  Suicidal thoughts 0 - - - -  PHQ-9 Score 9 - - - -  Difficult doing work/chores - - - - -     Review of Systems  Constitutional: Negative.   HENT: Negative.   Eyes: Negative.   Respiratory: Negative.  Negative for shortness of breath.   Cardiovascular: Negative.  Negative for chest pain.  Gastrointestinal: Negative.   Genitourinary: Negative.        Denies urinary incontinence  Neurological: Negative.  Negative for headaches.  Psychiatric/Behavioral: Negative.   All other systems reviewed and are negative.      Objective:   Physical Exam  Constitutional: He is oriented to person, place, and time. He appears well-developed and well-nourished.  HENT:  Head: Normocephalic.  Right Ear: External ear normal.  Left Ear: External ear normal.  Nose:  Nose normal.  Mouth/Throat: Oropharynx is clear and moist.  Eyes: EOM are normal. Pupils are equal, round, and reactive to light.  Neck: Normal range of motion. Neck supple. No JVD present. No thyromegaly present.  Cardiovascular: Normal rate, regular rhythm, normal heart sounds and intact distal pulses.  Exam reveals no gallop and no friction rub.   No murmur heard. Pulmonary/Chest: Effort normal and breath sounds normal. No respiratory distress. He has no wheezes. He has no rales. He exhibits  no tenderness.  Abdominal: Soft. Bowel sounds are normal. He exhibits no mass. There is no tenderness.  Genitourinary:  Genitourinary Comments: Refuses rectal exam  Musculoskeletal: Normal range of motion. He exhibits no edema.  Lymphadenopathy:    He has no cervical adenopathy.  Neurological: He is alert and oriented to person, place, and time. No cranial nerve deficit.  Skin: Skin is warm and dry.  Psychiatric: He has a normal mood and affect. His behavior is normal. Judgment and thought content normal.   BP 122/69   Pulse 66   Temp (!) 96.7 F (35.9 C) (Oral)   Ht 5\' 10"  (1.778 m)   Wt 207 lb (93.9 kg)   BMI 29.70 kg/m        Assessment & Plan:  1. Essential hypertension Low sodium diet - hydrochlorothiazide (HYDRODIURIL) 25 MG tablet; Take 1 tablet (25 mg total) by mouth daily.  Dispense: 90 tablet; Refill: 1 - losartan (COZAAR) 100 MG tablet; Take 1 tablet (100 mg total) by mouth daily.  Dispense: 90 tablet; Refill: 1  2. GAD (generalized anxiety disorder) Stress management - escitalopram (LEXAPRO) 10 MG tablet; Take 1 tablet (10 mg total) by mouth daily.  Dispense: 30 tablet; Refill: 5  3. Mixed hyperlipidemia Low fat diet - atorvastatin (LIPITOR) 40 MG tablet; Take 1 tablet (40 mg total) by mouth daily.  Dispense: 90 tablet; Refill: 1   I was present at this session.  I have reviewed the session itself and made appropriate changes.    hemoccult cards given to patient with directions Labs pending Health maintenance reviewed Diet and exercise encouraged Continue all meds Follow up  In 6 months   [place], FNP

## 2016-10-20 NOTE — Patient Instructions (Signed)
Stress and Stress Management Stress is a normal reaction to life events. It is what you feel when life demands more than you are used to or more than you can handle. Some stress can be useful. For example, the stress reaction can help you catch the last bus of the day, study for a test, or meet a deadline at work. But stress that occurs too often or for too long can cause problems. It can affect your emotional health and interfere with relationships and normal daily activities. Too much stress can weaken your immune system and increase your risk for physical illness. If you already have a medical problem, stress can make it worse. What are the causes? All sorts of life events may cause stress. An event that causes stress for one person may not be stressful for another person. Major life events commonly cause stress. These may be positive or negative. Examples include losing your job, moving into a new home, getting married, having a baby, or losing a loved one. Less obvious life events may also cause stress, especially if they occur day after day or in combination. Examples include working long hours, driving in traffic, caring for children, being in debt, or being in a difficult relationship. What are the signs or symptoms? Stress may cause emotional symptoms including, the following:  Anxiety. This is feeling worried, afraid, on edge, overwhelmed, or out of control.  Anger. This is feeling irritated or impatient.  Depression. This is feeling sad, down, helpless, or guilty.  Difficulty focusing, remembering, or making decisions. Stress may cause physical symptoms, including the following:  Aches and pains. These may affect your head, neck, back, stomach, or other areas of your body.  Tight muscles or clenched jaw.  Low energy or trouble sleeping. Stress may cause unhealthy behaviors, including the following:  Eating to feel better (overeating) or skipping meals.  Sleeping too little, too  much, or both.  Working too much or putting off tasks (procrastination).  Smoking, drinking alcohol, or using drugs to feel better. How is this diagnosed? Stress is diagnosed through an assessment by your health care provider. Your health care provider will ask questions about your symptoms and any stressful life events.Your health care provider will also ask about your medical history and may order blood tests or other tests. Certain medical conditions and medicine can cause physical symptoms similar to stress. Mental illness can cause emotional symptoms and unhealthy behaviors similar to stress. Your health care provider may refer you to a mental health professional for further evaluation. How is this treated? Stress management is the recommended treatment for stress.The goals of stress management are reducing stressful life events and coping with stress in healthy ways. Techniques for reducing stressful life events include the following:  Stress identification. Self-monitor for stress and identify what causes stress for you. These skills may help you to avoid some stressful events.  Time management. Set your priorities, keep a calendar of events, and learn to say "no." These tools can help you avoid making too many commitments. Techniques for coping with stress include the following:  Rethinking the problem. Try to think realistically about stressful events rather than ignoring them or overreacting. Try to find the positives in a stressful situation rather than focusing on the negatives.  Exercise. Physical exercise can release both physical and emotional tension. The key is to find a form of exercise you enjoy and do it regularly.  Relaxation techniques. These relax the body and mind. Examples include yoga,  meditation, tai chi, biofeedback, deep breathing, progressive muscle relaxation, listening to music, being out in nature, journaling, and other hobbies. Again, the key is to find one or  more that you enjoy and can do regularly.  Healthy lifestyle. Eat a balanced diet, get plenty of sleep, and do not smoke. Avoid using alcohol or drugs to relax.  Strong support network. Spend time with family, friends, or other people you enjoy being around.Express your feelings and talk things over with someone you trust. Counseling or talktherapy with a mental health professional may be helpful if you are having difficulty managing stress on your own. Medicine is typically not recommended for the treatment of stress.Talk to your health care provider if you think you need medicine for symptoms of stress. Follow these instructions at home:  Keep all follow-up visits as directed by your health care provider.  Take all medicines as directed by your health care provider. Contact a health care provider if:  Your symptoms get worse or you start having new symptoms.  You feel overwhelmed by your problems and can no longer manage them on your own. Get help right away if:  You feel like hurting yourself or someone else. This information is not intended to replace advice given to you by your health care provider. Make sure you discuss any questions you have with your health care provider. Document Released: 01/13/2001 Document Revised: 12/26/2015 Document Reviewed: 03/14/2013 Elsevier Interactive Patient Education  2017 [employer].

## 2016-10-21 LAB — CMP14+EGFR
ALK PHOS: 101 IU/L (ref 39–117)
ALT: 10 IU/L (ref 0–44)
AST: 10 IU/L (ref 0–40)
Albumin/Globulin Ratio: 2 (ref 1.2–2.2)
Albumin: 3.9 g/dL (ref 3.6–4.8)
BUN/Creatinine Ratio: 10 (ref 10–24)
BUN: 17 mg/dL (ref 8–27)
Bilirubin Total: 0.3 mg/dL (ref 0.0–1.2)
CO2: 28 mmol/L (ref 18–29)
CREATININE: 1.69 mg/dL — AB (ref 0.76–1.27)
Calcium: 9.2 mg/dL (ref 8.6–10.2)
Chloride: 99 mmol/L (ref 96–106)
GFR calc Af Amer: 47 mL/min/{1.73_m2} — ABNORMAL LOW (ref >59)
GFR calc non Af Amer: 41 mL/min/{1.73_m2} — ABNORMAL LOW (ref >59)
GLUCOSE: 98 mg/dL (ref 65–99)
Globulin, Total: 2 g/dL (ref 1.5–4.5)
Potassium: 3.9 mmol/L (ref 3.5–5.2)
Sodium: 143 mmol/L (ref 134–144)
Total Protein: 5.9 g/dL — ABNORMAL LOW (ref 6.0–8.5)

## 2016-10-21 LAB — LIPID PANEL
Chol/HDL Ratio: 2.6 ratio units (ref 0.0–5.0)
Cholesterol, Total: 129 mg/dL (ref 100–199)
HDL: 50 mg/dL (ref >39)
LDL CALC: 67 mg/dL (ref 0–99)
Triglycerides: 60 mg/dL (ref 0–149)
VLDL CHOLESTEROL CAL: 12 mg/dL (ref 5–40)

## 2016-10-21 LAB — PSA, TOTAL AND FREE
PROSTATE SPECIFIC AG, SERUM: 0.3 ng/mL (ref 0.0–4.0)
PSA FREE: 0.12 ng/mL
PSA, Free Pct: 40 %

## 2017-01-20 ENCOUNTER — Ambulatory Visit (INDEPENDENT_AMBULATORY_CARE_PROVIDER_SITE_OTHER): Payer: Medicare Other | Admitting: *Deleted

## 2017-01-20 VITALS — BP 117/64 | HR 76 | Ht 71.0 in | Wt 202.0 lb

## 2017-01-20 DIAGNOSIS — Z0000 Encounter for general adult medical examination without abnormal findings: Secondary | ICD-10-CM

## 2017-01-20 NOTE — Patient Instructions (Signed)
  Dean Mcconnell ,  Thank you for taking time to come for your Medicare Wellness Visit. I appreciate your ongoing commitment to your health goals. Please review the following plan we discussed and let me know if I can assist you in the future.   These are the goals we discussed: Goals    . Exercise 3x per week (30 min per time)      Return stool specimen card.    This is a list of the screening recommended for you and due dates:  Health Maintenance  Topic Date Due  . Stool Blood Test  01/23/1997  . Pneumonia vaccines (1 of 2 - PCV13) 01/20/2018*  . Flu Shot  03/03/2017  . Tetanus Vaccine  09/05/2019  .  Hepatitis C: One time screening is recommended by Center for Disease Control  (CDC) for  adults born from [birth year] through 1964.   Completed  *Topic was postponed. The date shown is not the original due date.

## 2017-01-20 NOTE — Progress Notes (Signed)
Subjective:   Dean Mcconnell is a 71 y.o. male who presents for a subsequent Medicare Annual Wellness Visit. Dean Mcconnell lives at home alone. He has a girlfriend that lives down the street. They have been together for 2 months and he is very happy with her. She is good for his self esteem and he has been able to discontinue his anxiety and depression medications. He is very active around his home cooking, cleaning, and doing yard work. He has a daughter and 3 adult grandchildren. He also has two sisters. One lives down the street from him. He mentioned many times throughout the visit that he feels much better now.  Review of Systems  Report that his health is better than last year.  Cardiac Risk Factors include: advanced age (>61men, >60 women);dyslipidemia;male gender;hypertension;smoking/ tobacco exposure  Other systems negative.   Objective:    Today's Vitals   01/20/17 1415  BP: 117/64  Pulse: 76  Weight: 202 lb (91.6 kg)  Height: 5\' 11"  (1.803 m)   Body mass index is 28.17 kg/m.  Current Medications (verified) Outpatient Encounter Prescriptions as of 01/20/2017  Medication Sig  . atorvastatin (LIPITOR) 40 MG tablet Take 1 tablet (40 mg total) by mouth daily.  . hydrochlorothiazide (HYDRODIURIL) 25 MG tablet Take 1 tablet (25 mg total) by mouth daily.  Marland Kitchen losartan (COZAAR) 100 MG tablet Take 1 tablet (100 mg total) by mouth daily.  . [DISCONTINUED] clonazePAM (KLONOPIN) 0.5 MG tablet Take 1 tablet (0.5 mg total) by mouth 2 (two) times daily as needed for anxiety.  . [DISCONTINUED] escitalopram (LEXAPRO) 10 MG tablet Take 1 tablet (10 mg total) by mouth daily.  . [DISCONTINUED] traMADol (ULTRAM) 50 MG tablet Take 1 tablet (50 mg total) by mouth every 8 (eight) hours as needed.   No facility-administered encounter medications on file as of 01/20/2017.     Allergies (verified) Penicillins   History: Past Medical History:  Diagnosis Date  . Cancer ([place])    melanoma on nose  .  Hyperlipidemia   . Hypertension    Past Surgical History:  Procedure Laterality Date  . SKIN CANCER EXCISION     nose   Family History  Problem Relation Age of Onset  . Hypertension Father    Social History   Occupational History  . Retired      Statistician   Social History Main Topics  . Smoking status: Light Tobacco Smoker    Packs/day: 0.25    Years: 30.00    Types: Cigarettes  . Smokeless tobacco: Former Systems developer    Types: Chew     Comment: Patient states that he no longer inhales  . Alcohol use No  . Drug use: No  . Sexual activity: Yes   Tobacco Counseling Ready to quit: Not Answered Counseling given: Not Answered Patient is not interested in quitting at this time  Activities of Daily Living In your present state of health, do you have any difficulty performing the following activities: 01/20/2017  Hearing? N  Vision? N  Difficulty concentrating or making decisions? N  Walking or climbing stairs? N  Dressing or bathing? N  Doing errands, shopping? N  Preparing Food and eating ? N  Using the Toilet? N  In the past six months, have you accidently leaked urine? N  Do you have problems with loss of bowel control? N  Managing your Medications? N  Managing your Finances? N  Housekeeping or managing your Housekeeping? N  Some  recent data might be hidden    Immunizations and Health Maintenance Immunization History  Administered Date(s) Administered  . Td 09/04/2009   Health Maintenance Due  Topic Date Due  . COLON CANCER SCREENING ANNUAL FOBT  01/23/1997    Patient Care Team: Chevis Pretty, FNP as PCP - General (Nurse Practitioner) Harlen Labs, MD as Referring Physician (Optometry)  No hospitalizations, ER visits, or surgeries this past year.   Assessment:   This is a routine wellness examination for Dean Mcconnell.   Hearing/Vision screen No deficits noted during visit. Last eye exam was a couple of years ago with Dr Anthony Sar.    Dietary issues and exercise activities discussed: Current Exercise Habits: Home exercise routine, Type of exercise: strength training/weights;walking, Time (Minutes): 30, Frequency (Times/Week): 4, Weekly Exercise (Minutes/Week): 120, Intensity: Moderate  Diet: 2 meals a day Drinks coffee at breakfast and eats a sandwich for lunch and cooks supper. Example of supper is a baked potato and steak. He doesn't eat as much during the summer due to the heat.  Goals    . Exercise 3x per week (30 min per time)      Depression Screen PHQ 2/9 Scores 01/20/2017 10/20/2016 08/13/2016 07/14/2016  PHQ - 2 Score 0 4 0 0  PHQ- 9 Score - 9 - -    Fall Risk Fall Risk  01/20/2017 10/20/2016 08/13/2016 07/14/2016 03/30/2016  Falls in the past year? No No No No No    Cognitive Function: MMSE - Mini Mental State Exam 01/20/2017 11/29/2014  Orientation to time 5 5  Orientation to Place 5 5  Registration 3 3  Attention/ Calculation 5 5  Recall 2 3  Language- name 2 objects 2 2  Language- repeat 1 1  Language- follow 3 step command 3 3  Language- read & follow direction 1 1  Write a sentence 0 1  Copy design 0 1  Total score 27 30  Sentence lacked structure and the design was slightly off. Otherwise normal.      Screening Tests Health Maintenance  Topic Date Due  . COLON CANCER SCREENING ANNUAL FOBT  01/23/1997  . PNA vac Low Risk Adult (1 of 2 - PCV13) 01/20/2018 (Originally 01/24/2012)  . INFLUENZA VACCINE  03/03/2017  . TETANUS/TDAP  09/05/2019  . Hepatitis C Screening  Completed   Declined pneumonia vaccine     Plan:  Has an FOBT at home and plans to return it. Keep f/u with Chevis Pretty, FNP on 01/28/17. Review Advance Directives with family. Bring a signed/notarized copy to our office. Stay active. Exercise for at least 30 minutes daily.  I have personally reviewed and noted the following in the patient's chart:   . Medical and social history . Use of alcohol, tobacco or  illicit drugs  . Current medications and supplements . Functional ability and status . Nutritional status . Physical activity . Advanced directives . List of other physicians . Hospitalizations, surgeries, and ER visits in previous 12 months . Vitals . Screenings to include cognitive, depression, and falls . Referrals and appointments  In addition, I have reviewed and discussed with patient certain preventive protocols, quality metrics, and best practice recommendations. A written personalized care plan for preventive services as well as general preventive health recommendations were provided to patient.     Chong Sicilian,  RN 01/20/2017    I have reviewed and agree with the above AWV documentation.   Assunta Found, MD [facility] 01/20/2017, 3:48 PM

## 2017-01-27 ENCOUNTER — Other Ambulatory Visit: Payer: Medicare Other

## 2017-01-28 ENCOUNTER — Ambulatory Visit (INDEPENDENT_AMBULATORY_CARE_PROVIDER_SITE_OTHER): Payer: Medicare Other | Admitting: Nurse Practitioner

## 2017-01-28 ENCOUNTER — Encounter: Payer: Self-pay | Admitting: Nurse Practitioner

## 2017-01-28 VITALS — BP 120/62 | HR 72 | Temp 96.8°F | Ht 71.0 in | Wt 201.2 lb

## 2017-01-28 DIAGNOSIS — Z6828 Body mass index (BMI) 28.0-28.9, adult: Secondary | ICD-10-CM

## 2017-01-28 DIAGNOSIS — Z1212 Encounter for screening for malignant neoplasm of rectum: Secondary | ICD-10-CM

## 2017-01-28 DIAGNOSIS — N529 Male erectile dysfunction, unspecified: Secondary | ICD-10-CM | POA: Diagnosis not present

## 2017-01-28 DIAGNOSIS — E782 Mixed hyperlipidemia: Secondary | ICD-10-CM | POA: Diagnosis not present

## 2017-01-28 DIAGNOSIS — I10 Essential (primary) hypertension: Secondary | ICD-10-CM

## 2017-01-28 DIAGNOSIS — F325 Major depressive disorder, single episode, in full remission: Secondary | ICD-10-CM | POA: Diagnosis not present

## 2017-01-28 MED ORDER — HYDROCHLOROTHIAZIDE 25 MG PO TABS
25.0000 mg | ORAL_TABLET | Freq: Every day | ORAL | 1 refills | Status: DC
Start: 2017-01-28 — End: 2017-08-16

## 2017-01-28 MED ORDER — ATORVASTATIN CALCIUM 40 MG PO TABS: 40.0000 mg | ORAL_TABLET | Freq: Every day | ORAL | 1 refills | Status: DC

## 2017-01-28 MED ORDER — LOSARTAN POTASSIUM 100 MG PO TABS: 100.0000 mg | ORAL_TABLET | Freq: Every day | ORAL | 1 refills | Status: DC

## 2017-01-28 NOTE — Patient Instructions (Signed)
DASH Eating Plan DASH stands for "Dietary Approaches to Stop Hypertension." The DASH eating plan is a healthy eating plan that has been shown to reduce high blood pressure (hypertension). It may also reduce your risk for type 2 diabetes, heart disease, and stroke. The DASH eating plan may also help with weight loss. What are tips for following this plan? General guidelines  Avoid eating more than 2,300 mg (milligrams) of salt (sodium) a day. If you have hypertension, you may need to reduce your sodium intake to 1,500 mg a day.  Limit alcohol intake to no more than 1 drink a day for nonpregnant women and 2 drinks a day for men. One drink equals 12 oz of beer, 5 oz of wine, or 1 oz of hard liquor.  Work with your health care provider to maintain a healthy body weight or to lose weight. Ask what an ideal weight is for you.  Get at least 30 minutes of exercise that causes your heart to beat faster (aerobic exercise) most days of the week. Activities may include walking, swimming, or biking.  Work with your health care provider or diet and nutrition specialist (dietitian) to adjust your eating plan to your individual calorie needs. Reading food labels  Check food labels for the amount of sodium per serving. Choose foods with less than 5 percent of the Daily Value of sodium. Generally, foods with less than 300 mg of sodium per serving fit into this eating plan.  To find whole grains, look for the word "whole" as the first word in the ingredient list. Shopping  Buy products labeled as "low-sodium" or "no salt added."  Buy fresh foods. Avoid canned foods and premade or frozen meals. Cooking  Avoid adding salt when cooking. Use salt-free seasonings or herbs instead of table salt or sea salt. Check with your health care provider or pharmacist before using salt substitutes.  Do not fry foods. Cook foods using healthy methods such as baking, boiling, grilling, and broiling instead.  Cook with  heart-healthy oils, such as olive, canola, soybean, or sunflower oil. Meal planning   Eat a balanced diet that includes: ? 5 or more servings of fruits and vegetables each day. At each meal, try to fill half of your plate with fruits and vegetables. ? Up to 6-8 servings of whole grains each day. ? Less than 6 oz of lean meat, poultry, or fish each day. A 3-oz serving of meat is about the same size as a deck of cards. One egg equals 1 oz. ? 2 servings of low-fat dairy each day. ? A serving of nuts, seeds, or beans 5 times each week. ? Heart-healthy fats. Healthy fats called Omega-3 fatty acids are found in foods such as flaxseeds and coldwater fish, like sardines, salmon, and mackerel.  Limit how much you eat of the following: ? Canned or prepackaged foods. ? Food that is high in trans fat, such as fried foods. ? Food that is high in saturated fat, such as fatty meat. ? Sweets, desserts, sugary drinks, and other foods with added sugar. ? Full-fat dairy products.  Do not salt foods before eating.  Try to eat at least 2 vegetarian meals each week.  Eat more home-cooked food and less restaurant, buffet, and fast food.  When eating at a restaurant, ask that your food be prepared with less salt or no salt, if possible. What foods are recommended? The items listed may not be a complete list. Talk with your dietitian about what   dietary choices are best for you. Grains Whole-grain or whole-wheat bread. Whole-grain or whole-wheat pasta. Brown rice. Oatmeal. Quinoa. Bulgur. Whole-grain and low-sodium cereals. Pita bread. Low-fat, low-sodium crackers. Whole-wheat flour tortillas. Vegetables Fresh or frozen vegetables (raw, steamed, roasted, or grilled). Low-sodium or reduced-sodium tomato and vegetable juice. Low-sodium or reduced-sodium tomato sauce and tomato paste. Low-sodium or reduced-sodium canned vegetables. Fruits All fresh, dried, or frozen fruit. Canned fruit in natural juice (without  added sugar). Meat and other protein foods Skinless chicken or turkey. Ground chicken or turkey. Pork with fat trimmed off. Fish and seafood. Egg whites. Dried beans, peas, or lentils. Unsalted nuts, nut butters, and seeds. Unsalted canned beans. Lean cuts of beef with fat trimmed off. Low-sodium, lean deli meat. Dairy Low-fat (1%) or fat-free (skim) milk. Fat-free, low-fat, or reduced-fat cheeses. Nonfat, low-sodium ricotta or cottage cheese. Low-fat or nonfat yogurt. Low-fat, low-sodium cheese. Fats and oils Soft margarine without trans fats. Vegetable oil. Low-fat, reduced-fat, or light mayonnaise and salad dressings (reduced-sodium). Canola, safflower, olive, soybean, and sunflower oils. Avocado. Seasoning and other foods Herbs. Spices. Seasoning mixes without salt. Unsalted popcorn and pretzels. Fat-free sweets. What foods are not recommended? The items listed may not be a complete list. Talk with your dietitian about what dietary choices are best for you. Grains Baked goods made with fat, such as croissants, muffins, or some breads. Dry pasta or rice meal packs. Vegetables Creamed or fried vegetables. Vegetables in a cheese sauce. Regular canned vegetables (not low-sodium or reduced-sodium). Regular canned tomato sauce and paste (not low-sodium or reduced-sodium). Regular tomato and vegetable juice (not low-sodium or reduced-sodium). Pickles. Olives. Fruits Canned fruit in a light or heavy syrup. Fried fruit. Fruit in cream or butter sauce. Meat and other protein foods Fatty cuts of meat. Ribs. Fried meat. Bacon. Sausage. Bologna and other processed lunch meats. Salami. Fatback. Hotdogs. Bratwurst. Salted nuts and seeds. Canned beans with added salt. Canned or smoked fish. Whole eggs or egg yolks. Chicken or turkey with skin. Dairy Whole or 2% milk, cream, and half-and-half. Whole or full-fat cream cheese. Whole-fat or sweetened yogurt. Full-fat cheese. Nondairy creamers. Whipped toppings.  Processed cheese and cheese spreads. Fats and oils Butter. Stick margarine. Lard. Shortening. Ghee. Bacon fat. Tropical oils, such as coconut, palm kernel, or palm oil. Seasoning and other foods Salted popcorn and pretzels. Onion salt, garlic salt, seasoned salt, table salt, and sea salt. Worcestershire sauce. Tartar sauce. Barbecue sauce. Teriyaki sauce. Soy sauce, including reduced-sodium. Steak sauce. Canned and packaged gravies. Fish sauce. Oyster sauce. Cocktail sauce. Horseradish that you find on the shelf. Ketchup. Mustard. Meat flavorings and tenderizers. Bouillon cubes. Hot sauce and Tabasco sauce. Premade or packaged marinades. Premade or packaged taco seasonings. Relishes. Regular salad dressings. Where to find more information:  National Heart, Lung, and Blood Institute: www.nhlbi.nih.gov  American Heart Association: www.heart.org Summary  The DASH eating plan is a healthy eating plan that has been shown to reduce high blood pressure (hypertension). It may also reduce your risk for type 2 diabetes, heart disease, and stroke.  With the DASH eating plan, you should limit salt (sodium) intake to 2,300 mg a day. If you have hypertension, you may need to reduce your sodium intake to 1,500 mg a day.  When on the DASH eating plan, aim to eat more fresh fruits and vegetables, whole grains, lean proteins, low-fat dairy, and heart-healthy fats.  Work with your health care provider or diet and nutrition specialist (dietitian) to adjust your eating plan to your individual   calorie needs. This information is not intended to replace advice given to you by your health care provider. Make sure you discuss any questions you have with your health care provider. Document Released: 07/09/2011 Document Revised: 07/13/2016 Document Reviewed: 07/13/2016 Elsevier Interactive Patient Education  2017 [employer].  

## 2017-01-28 NOTE — Addendum Note (Signed)
Addended by: Earlene Plater on: 01/28/2017 01:55 PM   Modules accepted: Orders

## 2017-01-28 NOTE — Progress Notes (Signed)
Subjective:    Patient ID: Dean Mcconnell, male    DOB: [birth year], 71 y.o.   MRN: [id number]  HPI  Dean Mcconnell is here today for follow up of chronic medical problem.  Outpatient Encounter Prescriptions as of 01/28/2017  Medication Sig  . atorvastatin (LIPITOR) 40 MG tablet Take 1 tablet (40 mg total) by mouth daily.  . hydrochlorothiazide (HYDRODIURIL) 25 MG tablet Take 1 tablet (25 mg total) by mouth daily.  Marland Kitchen losartan (COZAAR) 100 MG tablet Take 1 tablet (100 mg total) by mouth daily.   No facility-administered encounter medications on file as of 01/28/2017.     1. Screening for malignant neoplasm of the rectum  Patient brought hemoccult cards in today  2. Essential hypertension  No c/o chest pain,SOB or HA- doe snot check blood pressures at home  3. Erectile dysfunction, unspecified erectile dysfunction type  Has not been active lately- usually needs 'Help " when he is  4. Mixed hyperlipidemia  Not watching diet  5. Major depressive disorder with single episode, in full remission [facility])  Says he is doing much better- was having issues after his girlfriend moved out of his house. He now has a new women he is seeing and he is doing really well. Depression screen [facility] 2/9 01/28/2017 01/20/2017 10/20/2016 08/13/2016 07/14/2016  Decreased Interest 0 0 2 0 0  Down, Depressed, Hopeless 0 0 2 0 0  PHQ - 2 Score 0 0 4 0 0  Altered sleeping - - 1 - -  Tired, decreased energy - - 0 - -  Change in appetite - - 2 - -  Feeling bad or failure about yourself  - - 0 - -  Trouble concentrating - - 2 - -  Moving slowly or fidgety/restless - - 0 - -  Suicidal thoughts - - 0 - -  PHQ-9 Score - - 9 - -  Difficult doing work/chores - - - - -     6. BMI 28.0-28.9,adult  No recent weight changes    New complaints: none today     Review of Systems  Constitutional: Negative for activity change and appetite change.  HENT: Negative.   Eyes: Negative for pain.  Respiratory: Negative for  shortness of breath.   Cardiovascular: Negative for chest pain, palpitations and leg swelling.  Gastrointestinal: Negative for abdominal pain.  Endocrine: Negative for polydipsia.  Genitourinary: Negative.   Skin: Negative for rash.  Neurological: Negative for dizziness, weakness and headaches.  Hematological: Does not bruise/bleed easily.  Psychiatric/Behavioral: Negative.   All other systems reviewed and are negative.      Objective:   Physical Exam  Constitutional: He is oriented to person, place, and time. He appears well-developed and well-nourished.  HENT:  Head: Normocephalic.  Right Ear: External ear normal.  Left Ear: External ear normal.  Nose: Nose normal.  Mouth/Throat: Oropharynx is clear and moist.  Eyes: EOM are normal. Pupils are equal, round, and reactive to light.  Neck: Normal range of motion. Neck supple. No JVD present. No thyromegaly present.  Cardiovascular: Normal rate, regular rhythm, normal heart sounds and intact distal pulses.  Exam reveals no gallop and no friction rub.   No murmur heard. Pulmonary/Chest: Effort normal and breath sounds normal. No respiratory distress. He has no wheezes. He has no rales. He exhibits no tenderness.  Abdominal: Soft. Bowel sounds are normal. He exhibits no mass. There is no tenderness.  Genitourinary: Prostate normal and penis normal.  Musculoskeletal: Normal range of motion.  He exhibits no edema.  Lymphadenopathy:    He has no cervical adenopathy.  Neurological: He is alert and oriented to person, place, and time. No cranial nerve deficit.  Skin: Skin is warm and dry.  Psychiatric: He has a normal mood and affect. His behavior is normal. Judgment and thought content normal.   BP 120/62   Pulse 72   Temp (!) 96.8 F (36 C)   Ht _0  (1.803 m)   Wt 201 lb 3.2 oz (91.3 kg)   BMI 28.06 kg/m      Assessment & Plan:  1. Screening for malignant neoplasm of the rectum - Fecal occult blood, imunochemical;  Future  2. Essential hypertension Low sodium diet - hydrochlorothiazide (HYDRODIURIL) 25 MG tablet; Take 1 tablet (25 mg total) by mouth daily.  Dispense: 90 tablet; Refill: 1 - losartan (COZAAR) 100 MG tablet; Take 1 tablet (100 mg total) by mouth daily.  Dispense: 90 tablet; Refill: 1 - CMP14+EGFR  3. Erectile dysfunction, unspecified erectile dysfunction type  4. Mixed hyperlipidemia Low fat diet - atorvastatin (LIPITOR) 40 MG tablet; Take 1 tablet (40 mg total) by mouth daily.  Dispense: 90 tablet; Refill: 1 - Lipid panel  5. Major depressive disorder with single episode, in full remission ([place]) Continue stress management  6. BMI 28.0-28.9,adult Discussed diet and exercise for person with BMI >25 Will recheck weight in 3-6 months    Labs pending Health maintenance reviewed Diet and exercise encouraged Continue all meds Follow up  In 6 months   [place], FNP

## 2017-01-29 LAB — CMP14+EGFR
ALBUMIN: 4 g/dL (ref 3.5–4.8)
ALK PHOS: 112 IU/L (ref 39–117)
ALT: 8 IU/L (ref 0–44)
AST: 10 IU/L (ref 0–40)
Albumin/Globulin Ratio: 2 (ref 1.2–2.2)
BUN / CREAT RATIO: 9 — AB (ref 10–24)
BUN: 15 mg/dL (ref 8–27)
Bilirubin Total: 0.6 mg/dL (ref 0.0–1.2)
CO2: 27 mmol/L (ref 20–29)
CREATININE: 1.58 mg/dL — AB (ref 0.76–1.27)
Calcium: 9.3 mg/dL (ref 8.6–10.2)
Chloride: 100 mmol/L (ref 96–106)
GFR calc non Af Amer: 44 mL/min/{1.73_m2} — ABNORMAL LOW (ref >59)
GFR, EST AFRICAN AMERICAN: 50 mL/min/{1.73_m2} — AB (ref >59)
GLOBULIN, TOTAL: 2 g/dL (ref 1.5–4.5)
Glucose: 101 mg/dL — ABNORMAL HIGH (ref 65–99)
Potassium: 3.5 mmol/L (ref 3.5–5.2)
SODIUM: 140 mmol/L (ref 134–144)
Total Protein: 6 g/dL (ref 6.0–8.5)

## 2017-01-29 LAB — LIPID PANEL
Chol/HDL Ratio: 2.8 ratio (ref 0.0–5.0)
Cholesterol, Total: 116 mg/dL (ref 100–199)
HDL: 41 mg/dL (ref >39)
LDL Calculated: 56 mg/dL (ref 0–99)
TRIGLYCERIDES: 95 mg/dL (ref 0–149)
VLDL Cholesterol Cal: 19 mg/dL (ref 5–40)

## 2017-01-30 LAB — FECAL OCCULT BLOOD, IMMUNOCHEMICAL: Fecal Occult Bld: NEGATIVE

## 2017-02-17 NOTE — Telephone Encounter (Signed)
na

## 2017-03-11 ENCOUNTER — Encounter: Payer: Self-pay | Admitting: *Deleted

## 2017-04-22 ENCOUNTER — Ambulatory Visit: Payer: Medicare Other | Admitting: Nurse Practitioner

## 2017-04-27 ENCOUNTER — Ambulatory Visit (INDEPENDENT_AMBULATORY_CARE_PROVIDER_SITE_OTHER): Payer: Medicare Other | Admitting: Nurse Practitioner

## 2017-04-27 ENCOUNTER — Encounter: Payer: Self-pay | Admitting: Nurse Practitioner

## 2017-04-27 VITALS — BP 123/69 | HR 72 | Temp 97.0°F | Ht 71.0 in | Wt 207.0 lb

## 2017-04-27 DIAGNOSIS — I10 Essential (primary) hypertension: Secondary | ICD-10-CM

## 2017-04-27 DIAGNOSIS — Z6828 Body mass index (BMI) 28.0-28.9, adult: Secondary | ICD-10-CM | POA: Diagnosis not present

## 2017-04-27 DIAGNOSIS — N529 Male erectile dysfunction, unspecified: Secondary | ICD-10-CM | POA: Diagnosis not present

## 2017-04-27 DIAGNOSIS — F325 Major depressive disorder, single episode, in full remission: Secondary | ICD-10-CM

## 2017-04-27 DIAGNOSIS — E782 Mixed hyperlipidemia: Secondary | ICD-10-CM

## 2017-04-27 MED ORDER — SILDENAFIL CITRATE 100 MG PO TABS: 100.0000 mg | ORAL_TABLET | Freq: Every day | ORAL | 1 refills | Status: DC | PRN

## 2017-04-27 NOTE — Patient Instructions (Signed)
DASH Eating Plan DASH stands for "Dietary Approaches to Stop Hypertension." The DASH eating plan is a healthy eating plan that has been shown to reduce high blood pressure (hypertension). It may also reduce your risk for type 2 diabetes, heart disease, and stroke. The DASH eating plan may also help with weight loss. What are tips for following this plan? General guidelines  Avoid eating more than 2,300 mg (milligrams) of salt (sodium) a day. If you have hypertension, you may need to reduce your sodium intake to 1,500 mg a day.  Limit alcohol intake to no more than 1 drink a day for nonpregnant women and 2 drinks a day for men. One drink equals 12 oz of beer, 5 oz of wine, or 1 oz of hard liquor.  Work with your health care provider to maintain a healthy body weight or to lose weight. Ask what an ideal weight is for you.  Get at least 30 minutes of exercise that causes your heart to beat faster (aerobic exercise) most days of the week. Activities may include walking, swimming, or biking.  Work with your health care provider or diet and nutrition specialist (dietitian) to adjust your eating plan to your individual calorie needs. Reading food labels  Check food labels for the amount of sodium per serving. Choose foods with less than 5 percent of the Daily Value of sodium. Generally, foods with less than 300 mg of sodium per serving fit into this eating plan.  To find whole grains, look for the word "whole" as the first word in the ingredient list. Shopping  Buy products labeled as "low-sodium" or "no salt added."  Buy fresh foods. Avoid canned foods and premade or frozen meals. Cooking  Avoid adding salt when cooking. Use salt-free seasonings or herbs instead of table salt or sea salt. Check with your health care provider or pharmacist before using salt substitutes.  Do not fry foods. Cook foods using healthy methods such as baking, boiling, grilling, and broiling instead.  Cook with  heart-healthy oils, such as olive, canola, soybean, or sunflower oil. Meal planning   Eat a balanced diet that includes: ? 5 or more servings of fruits and vegetables each day. At each meal, try to fill half of your plate with fruits and vegetables. ? Up to 6-8 servings of whole grains each day. ? Less than 6 oz of lean meat, poultry, or fish each day. A 3-oz serving of meat is about the same size as a deck of cards. One egg equals 1 oz. ? 2 servings of low-fat dairy each day. ? A serving of nuts, seeds, or beans 5 times each week. ? Heart-healthy fats. Healthy fats called Omega-3 fatty acids are found in foods such as flaxseeds and coldwater fish, like sardines, salmon, and mackerel.  Limit how much you eat of the following: ? Canned or prepackaged foods. ? Food that is high in trans fat, such as fried foods. ? Food that is high in saturated fat, such as fatty meat. ? Sweets, desserts, sugary drinks, and other foods with added sugar. ? Full-fat dairy products.  Do not salt foods before eating.  Try to eat at least 2 vegetarian meals each week.  Eat more home-cooked food and less restaurant, buffet, and fast food.  When eating at a restaurant, ask that your food be prepared with less salt or no salt, if possible. What foods are recommended? The items listed may not be a complete list. Talk with your dietitian about what   dietary choices are best for you. Grains Whole-grain or whole-wheat bread. Whole-grain or whole-wheat pasta. Brown rice. Oatmeal. Quinoa. Bulgur. Whole-grain and low-sodium cereals. Pita bread. Low-fat, low-sodium crackers. Whole-wheat flour tortillas. Vegetables Fresh or frozen vegetables (raw, steamed, roasted, or grilled). Low-sodium or reduced-sodium tomato and vegetable juice. Low-sodium or reduced-sodium tomato sauce and tomato paste. Low-sodium or reduced-sodium canned vegetables. Fruits All fresh, dried, or frozen fruit. Canned fruit in natural juice (without  added sugar). Meat and other protein foods Skinless chicken or turkey. Ground chicken or turkey. Pork with fat trimmed off. Fish and seafood. Egg whites. Dried beans, peas, or lentils. Unsalted nuts, nut butters, and seeds. Unsalted canned beans. Lean cuts of beef with fat trimmed off. Low-sodium, lean deli meat. Dairy Low-fat (1%) or fat-free (skim) milk. Fat-free, low-fat, or reduced-fat cheeses. Nonfat, low-sodium ricotta or cottage cheese. Low-fat or nonfat yogurt. Low-fat, low-sodium cheese. Fats and oils Soft margarine without trans fats. Vegetable oil. Low-fat, reduced-fat, or light mayonnaise and salad dressings (reduced-sodium). Canola, safflower, olive, soybean, and sunflower oils. Avocado. Seasoning and other foods Herbs. Spices. Seasoning mixes without salt. Unsalted popcorn and pretzels. Fat-free sweets. What foods are not recommended? The items listed may not be a complete list. Talk with your dietitian about what dietary choices are best for you. Grains Baked goods made with fat, such as croissants, muffins, or some breads. Dry pasta or rice meal packs. Vegetables Creamed or fried vegetables. Vegetables in a cheese sauce. Regular canned vegetables (not low-sodium or reduced-sodium). Regular canned tomato sauce and paste (not low-sodium or reduced-sodium). Regular tomato and vegetable juice (not low-sodium or reduced-sodium). Pickles. Olives. Fruits Canned fruit in a light or heavy syrup. Fried fruit. Fruit in cream or butter sauce. Meat and other protein foods Fatty cuts of meat. Ribs. Fried meat. Bacon. Sausage. Bologna and other processed lunch meats. Salami. Fatback. Hotdogs. Bratwurst. Salted nuts and seeds. Canned beans with added salt. Canned or smoked fish. Whole eggs or egg yolks. Chicken or turkey with skin. Dairy Whole or 2% milk, cream, and half-and-half. Whole or full-fat cream cheese. Whole-fat or sweetened yogurt. Full-fat cheese. Nondairy creamers. Whipped toppings.  Processed cheese and cheese spreads. Fats and oils Butter. Stick margarine. Lard. Shortening. Ghee. Bacon fat. Tropical oils, such as coconut, palm kernel, or palm oil. Seasoning and other foods Salted popcorn and pretzels. Onion salt, garlic salt, seasoned salt, table salt, and sea salt. Worcestershire sauce. Tartar sauce. Barbecue sauce. Teriyaki sauce. Soy sauce, including reduced-sodium. Steak sauce. Canned and packaged gravies. Fish sauce. Oyster sauce. Cocktail sauce. Horseradish that you find on the shelf. Ketchup. Mustard. Meat flavorings and tenderizers. Bouillon cubes. Hot sauce and Tabasco sauce. Premade or packaged marinades. Premade or packaged taco seasonings. Relishes. Regular salad dressings. Where to find more information:  National Heart, Lung, and Blood Institute: www.nhlbi.nih.gov  American Heart Association: www.heart.org Summary  The DASH eating plan is a healthy eating plan that has been shown to reduce high blood pressure (hypertension). It may also reduce your risk for type 2 diabetes, heart disease, and stroke.  With the DASH eating plan, you should limit salt (sodium) intake to 2,300 mg a day. If you have hypertension, you may need to reduce your sodium intake to 1,500 mg a day.  When on the DASH eating plan, aim to eat more fresh fruits and vegetables, whole grains, lean proteins, low-fat dairy, and heart-healthy fats.  Work with your health care provider or diet and nutrition specialist (dietitian) to adjust your eating plan to your individual   calorie needs. This information is not intended to replace advice given to you by your health care provider. Make sure you discuss any questions you have with your health care provider. Document Released: 07/09/2011 Document Revised: 07/13/2016 Document Reviewed: 07/13/2016 Elsevier Interactive Patient Education  2017 [employer].  

## 2017-04-27 NOTE — Progress Notes (Signed)
Subjective:    Patient ID: Dean Mcconnell, male    DOB: 1946/02/20, 71 y.o.   MRN: [id number]  HPI  Dean Mcconnell is here today for follow up of chronic medical problem.  Outpatient Encounter Prescriptions as of 04/27/2017  Medication Sig  . atorvastatin (LIPITOR) 40 MG tablet Take 1 tablet (40 mg total) by mouth daily.  . hydrochlorothiazide (HYDRODIURIL) 25 MG tablet Take 1 tablet (25 mg total) by mouth daily.  Marland Kitchen losartan (COZAAR) 100 MG tablet Take 1 tablet (100 mg total) by mouth daily.   No facility-administered encounter medications on file as of 04/27/2017.     1. Essential hypertension  No c/o chest pain, sob or headache. Does not check blood pressure at home.  2. Mixed hyperlipidemia  Says he does not watch his diet very closely  3. Major depressive disorder with single episode, in full remission ([place]) Got out of a bad relationship and is doing better. Is in a new relationship and says that he doe snot need anything   4. BMI 28.0-28.9,adult  No recent weight change    New complaints: None today  Social history: Is doing well in a new relationship and is very happy    Review of Systems  Constitutional: Negative for activity change and appetite change.  HENT: Negative.   Eyes: Negative for pain.  Respiratory: Negative for shortness of breath.   Cardiovascular: Negative for chest pain, palpitations and leg swelling.  Gastrointestinal: Negative for abdominal pain.  Endocrine: Negative for polydipsia.  Genitourinary: Negative.   Skin: Negative for rash.  Neurological: Negative for dizziness, weakness and headaches.  Hematological: Does not bruise/bleed easily.  Psychiatric/Behavioral: Negative.   All other systems reviewed and are negative.      Objective:   Physical Exam  Constitutional: He is oriented to person, place, and time. He appears well-developed and well-nourished.  HENT:  Head: Normocephalic.  Right Ear: External ear normal.  Left Ear: External  ear normal.  Nose: Nose normal.  Mouth/Throat: Oropharynx is clear and moist.  Eyes: Pupils are equal, round, and reactive to light. EOM are normal.  Neck: Normal range of motion. Neck supple. No JVD present. No thyromegaly present.  Cardiovascular: Normal rate, regular rhythm, normal heart sounds and intact distal pulses.  Exam reveals no gallop and no friction rub.   No murmur heard. Pulmonary/Chest: Effort normal and breath sounds normal. No respiratory distress. He has no wheezes. He has no rales. He exhibits no tenderness.  Abdominal: Soft. Bowel sounds are normal. He exhibits no mass. There is no tenderness.  Genitourinary: Prostate normal and penis normal.  Musculoskeletal: Normal range of motion. He exhibits no edema.  Lymphadenopathy:    He has no cervical adenopathy.  Neurological: He is alert and oriented to person, place, and time. No cranial nerve deficit.  Skin: Skin is warm and dry.  Psychiatric: He has a normal mood and affect. His behavior is normal. Judgment and thought content normal.    BP 123/69   Pulse 72   Temp (!) 97 F (36.1 C) (Oral)   Ht 5' 11" (1.803 m)   Wt 207 lb (93.9 kg)   BMI 28.87 kg/m      Assessment & Plan:  1. Essential hypertension Low sodium diet - CMP14+EGFR  2. Mixed hyperlipidemia Low fat diet - Lipid panel  3. Major depressive disorder with single episode, in full remission [facility]) Stress management  4. BMI 28.0-28.9,adult Discussed diet and exercise for person with BMI >25 Will  recheck weight in 3-6 months  5. Erectile dysfunction, unspecified erectile dysfunction type - sildenafil (VIAGRA) 100 MG tablet; Take 1 tablet (100 mg total) by mouth daily as needed for erectile dysfunction.  Dispense: 50 tablet; Refill: 1    Labs pending Health maintenance reviewed Diet and exercise encouraged Continue all meds Follow up  In 3 months   Mary-Margaret Martin, FNP   

## 2017-04-28 LAB — CMP14+EGFR
A/G RATIO: 1.9 (ref 1.2–2.2)
ALK PHOS: 112 IU/L (ref 39–117)
ALT: 12 IU/L (ref 0–44)
AST: 12 IU/L (ref 0–40)
Albumin: 4.1 g/dL (ref 3.5–4.8)
BILIRUBIN TOTAL: 0.3 mg/dL (ref 0.0–1.2)
BUN / CREAT RATIO: 11 (ref 10–24)
BUN: 17 mg/dL (ref 8–27)
CHLORIDE: 99 mmol/L (ref 96–106)
CO2: 28 mmol/L (ref 20–29)
Calcium: 9.3 mg/dL (ref 8.6–10.2)
Creatinine, Ser: 1.57 mg/dL — ABNORMAL HIGH (ref 0.76–1.27)
GFR calc non Af Amer: 44 mL/min/{1.73_m2} — ABNORMAL LOW (ref >59)
GFR, EST AFRICAN AMERICAN: 51 mL/min/{1.73_m2} — AB (ref >59)
GLUCOSE: 94 mg/dL (ref 65–99)
Globulin, Total: 2.2 g/dL (ref 1.5–4.5)
POTASSIUM: 3.7 mmol/L (ref 3.5–5.2)
Sodium: 143 mmol/L (ref 134–144)
Total Protein: 6.3 g/dL (ref 6.0–8.5)

## 2017-04-28 LAB — LIPID PANEL
Chol/HDL Ratio: 2.4 ratio (ref 0.0–5.0)
Cholesterol, Total: 122 mg/dL (ref 100–199)
HDL: 50 mg/dL (ref >39)
LDL Calculated: 59 mg/dL (ref 0–99)
Triglycerides: 65 mg/dL (ref 0–149)
VLDL Cholesterol Cal: 13 mg/dL (ref 5–40)

## 2017-04-30 ENCOUNTER — Ambulatory Visit: Payer: Medicare Other | Admitting: Nurse Practitioner

## 2017-07-30 ENCOUNTER — Ambulatory Visit: Payer: Medicare Other | Admitting: Nurse Practitioner

## 2017-08-16 ENCOUNTER — Ambulatory Visit (INDEPENDENT_AMBULATORY_CARE_PROVIDER_SITE_OTHER): Payer: Medicare Other | Admitting: Nurse Practitioner

## 2017-08-16 ENCOUNTER — Encounter: Payer: Self-pay | Admitting: Nurse Practitioner

## 2017-08-16 VITALS — BP 109/63 | HR 62 | Temp 96.8°F | Ht 71.0 in | Wt 210.0 lb

## 2017-08-16 DIAGNOSIS — E782 Mixed hyperlipidemia: Secondary | ICD-10-CM

## 2017-08-16 DIAGNOSIS — Z6828 Body mass index (BMI) 28.0-28.9, adult: Secondary | ICD-10-CM | POA: Diagnosis not present

## 2017-08-16 DIAGNOSIS — N529 Male erectile dysfunction, unspecified: Secondary | ICD-10-CM | POA: Diagnosis not present

## 2017-08-16 DIAGNOSIS — I10 Essential (primary) hypertension: Secondary | ICD-10-CM | POA: Diagnosis not present

## 2017-08-16 DIAGNOSIS — F325 Major depressive disorder, single episode, in full remission: Secondary | ICD-10-CM | POA: Diagnosis not present

## 2017-08-16 MED ORDER — LOSARTAN POTASSIUM 100 MG PO TABS: 100.0000 mg | ORAL_TABLET | Freq: Every day | ORAL | 1 refills | Status: DC

## 2017-08-16 MED ORDER — HYDROCHLOROTHIAZIDE 25 MG PO TABS: 25.0000 mg | ORAL_TABLET | Freq: Every day | ORAL | 1 refills | Status: DC

## 2017-08-16 MED ORDER — SILDENAFIL CITRATE 100 MG PO TABS: ORAL_TABLET | ORAL | 1 refills | Status: DC

## 2017-08-16 MED ORDER — ATORVASTATIN CALCIUM 40 MG PO TABS: 40.0000 mg | ORAL_TABLET | Freq: Every day | ORAL | 1 refills | Status: DC

## 2017-08-16 NOTE — Patient Instructions (Signed)
Stress and Stress Management Stress is a normal reaction to life events. It is what you feel when life demands more than you are used to or more than you can handle. Some stress can be useful. For example, the stress reaction can help you catch the last bus of the day, study for a test, or meet a deadline at work. But stress that occurs too often or for too long can cause problems. It can affect your emotional health and interfere with relationships and normal daily activities. Too much stress can weaken your immune system and increase your risk for physical illness. If you already have a medical problem, stress can make it worse. What are the causes? All sorts of life events may cause stress. An event that causes stress for one person may not be stressful for another person. Major life events commonly cause stress. These may be positive or negative. Examples include losing your job, moving into a new home, getting married, having a baby, or losing a loved one. Less obvious life events may also cause stress, especially if they occur day after day or in combination. Examples include working long hours, driving in traffic, caring for children, being in debt, or being in a difficult relationship. What are the signs or symptoms? Stress may cause emotional symptoms including, the following:  Anxiety. This is feeling worried, afraid, on edge, overwhelmed, or out of control.  Anger. This is feeling irritated or impatient.  Depression. This is feeling sad, down, helpless, or guilty.  Difficulty focusing, remembering, or making decisions.  Stress may cause physical symptoms, including the following:  Aches and pains. These may affect your head, neck, back, stomach, or other areas of your body.  Tight muscles or clenched jaw.  Low energy or trouble sleeping.  Stress may cause unhealthy behaviors, including the following:  Eating to feel better (overeating) or skipping meals.  Sleeping too little,  too much, or both.  Working too much or putting off tasks (procrastination).  Smoking, drinking alcohol, or using drugs to feel better.  How is this diagnosed? Stress is diagnosed through an assessment by your health care provider. Your health care provider will ask questions about your symptoms and any stressful life events.Your health care provider will also ask about your medical history and may order blood tests or other tests. Certain medical conditions and medicine can cause physical symptoms similar to stress. Mental illness can cause emotional symptoms and unhealthy behaviors similar to stress. Your health care provider may refer you to a mental health professional for further evaluation. How is this treated? Stress management is the recommended treatment for stress.The goals of stress management are reducing stressful life events and coping with stress in healthy ways. Techniques for reducing stressful life events include the following:  Stress identification. Self-monitor for stress and identify what causes stress for you. These skills may help you to avoid some stressful events.  Time management. Set your priorities, keep a calendar of events, and learn to say "no." These tools can help you avoid making too many commitments.  Techniques for coping with stress include the following:  Rethinking the problem. Try to think realistically about stressful events rather than ignoring them or overreacting. Try to find the positives in a stressful situation rather than focusing on the negatives.  Exercise. Physical exercise can release both physical and emotional tension. The key is to find a form of exercise you enjoy and do it regularly.  Relaxation techniques. These relax the body and  mind. Examples include yoga, meditation, tai chi, biofeedback, deep breathing, progressive muscle relaxation, listening to music, being out in nature, journaling, and other hobbies. Again, the key is to find  one or more that you enjoy and can do regularly.  Healthy lifestyle. Eat a balanced diet, get plenty of sleep, and do not smoke. Avoid using alcohol or drugs to relax.  Strong support network. Spend time with family, friends, or other people you enjoy being around.Express your feelings and talk things over with someone you trust.  Counseling or talktherapy with a mental health professional may be helpful if you are having difficulty managing stress on your own. Medicine is typically not recommended for the treatment of stress.Talk to your health care provider if you think you need medicine for symptoms of stress. Follow these instructions at home:  Keep all follow-up visits as directed by your health care provider.  Take all medicines as directed by your health care provider. Contact a health care provider if:  Your symptoms get worse or you start having new symptoms.  You feel overwhelmed by your problems and can no longer manage them on your own. Get help right away if:  You feel like hurting yourself or someone else. This information is not intended to replace advice given to you by your health care provider. Make sure you discuss any questions you have with your health care provider. Document Released: 01/13/2001 Document Revised: 12/26/2015 Document Reviewed: 03/14/2013 Elsevier Interactive Patient Education  2017 Elsevier Inc.  

## 2017-08-16 NOTE — Progress Notes (Signed)
 Subjective:    Patient ID: Dean Mcconnell, male    DOB: 10/18/1945, 71 y.o.   MRN: [id number]  HPI   Dean Mcconnell is here today for follow up of chronic medical problem.  Outpatient Encounter Medications as of 08/16/2017  Medication Sig  . atorvastatin (LIPITOR) 40 MG tablet Take 1 tablet (40 mg total) by mouth daily.  . hydrochlorothiazide (HYDRODIURIL) 25 MG tablet Take 1 tablet (25 mg total) by mouth daily.  . losartan (COZAAR) 100 MG tablet Take 1 tablet (100 mg total) by mouth daily.  . sildenafil (VIAGRA) 100 MG tablet Take 1 tablet (100 mg total) by mouth daily as needed for erectile dysfunction.     1. Essential hypertension  No c/o chest pain, sob or headache. Does not check blood pressures at home. BP Readings from Last 3 Encounters:  04/27/17 123/69  01/28/17 120/62  01/20/17 117/64     2. Erectile dysfunction, unspecified erectile dysfunction type  is in a new relationship and has to take viagra in order to perform.  3. Mixed hyperlipidemia  Not watching diet  4. Major depressive disorder with single episode, in full remission ([facility])  Is currently doing well without any medication  5. BMI 28.0-28.9,adult  No recent weight changes    New complaints: None today  Social history: Lives alone- in a new relationship and says he is very happy   Review of Systems  Constitutional: Negative for activity change and appetite change.  HENT: Negative.   Eyes: Negative for pain.  Respiratory: Negative for shortness of breath.   Cardiovascular: Negative for chest pain, palpitations and leg swelling.  Gastrointestinal: Negative for abdominal pain.  Endocrine: Negative for polydipsia.  Genitourinary: Negative.   Skin: Negative for rash.  Neurological: Negative for dizziness, weakness and headaches.  Hematological: Does not bruise/bleed easily.  Psychiatric/Behavioral: Negative.   All other systems reviewed and are negative.      Objective:   Physical Exam    Constitutional: He is oriented to person, place, and time. He appears well-developed and well-nourished.  HENT:  Head: Normocephalic.  Right Ear: External ear normal.  Left Ear: External ear normal.  Nose: Nose normal.  Mouth/Throat: Oropharynx is clear and moist.  Eyes: EOM are normal. Pupils are equal, round, and reactive to light.  Neck: Normal range of motion. Neck supple. No JVD present. No thyromegaly present.  Cardiovascular: Normal rate, regular rhythm, normal heart sounds and intact distal pulses. Exam reveals no gallop and no friction rub.  No murmur heard. Pulmonary/Chest: Effort normal and breath sounds normal. No respiratory distress. He has no wheezes. He has no rales. He exhibits no tenderness.  Abdominal: Soft. Bowel sounds are normal. He exhibits no mass. There is no tenderness.  Genitourinary: Prostate normal and penis normal.  Musculoskeletal: Normal range of motion. He exhibits no edema.  Lymphadenopathy:    He has no cervical adenopathy.  Neurological: He is alert and oriented to person, place, and time. No cranial nerve deficit.  Skin: Skin is warm and dry.  Psychiatric: He has a normal mood and affect. His behavior is normal. Judgment and thought content normal.   BP 109/63   Pulse 62   Temp (!) 96.8 F (36 C) (Oral)   Ht 5' 11" (1.803 m)   Wt 210 lb (95.3 kg)   BMI 29.29 kg/m       Assessment & Plan:  1. Essential hypertension Low fat diet - hydrochlorothiazide (HYDRODIURIL) 25 MG tablet; Take 1 tablet (25 mg   total) by mouth daily.  Dispense: 90 tablet; Refill: 1 - losartan (COZAAR) 100 MG tablet; Take 1 tablet (100 mg total) by mouth daily.  Dispense: 90 tablet; Refill: 1 - CMP14+EGFR  2. Erectile dysfunction, unspecified erectile dysfunction type - sildenafil (VIAGRA) 100 MG tablet; 1 po prn  Dispense: 50 tablet; Refill: 1  3. Mixed hyperlipidemia Low fat diet - atorvastatin (LIPITOR) 40 MG tablet; Take 1 tablet (40 mg total) by mouth daily.   Dispense: 90 tablet; Refill: 1 - Lipid panel  4. Major depressive disorder with single episode, in full remission [facility]) Stress management  5. BMI 28.0-28.9,adult Discussed diet and exercise for person with BMI >25 Will recheck weight in 3-6 months    Labs pending Health maintenance reviewed Diet and exercise encouraged Continue all meds Follow up  In 3 months   [place], FNP

## 2017-08-17 LAB — CMP14+EGFR
A/G RATIO: 1.7 (ref 1.2–2.2)
ALBUMIN: 3.9 g/dL (ref 3.5–4.8)
ALT: 6 IU/L (ref 0–44)
AST: 11 IU/L (ref 0–40)
Alkaline Phosphatase: 112 IU/L (ref 39–117)
BILIRUBIN TOTAL: 0.4 mg/dL (ref 0.0–1.2)
BUN / CREAT RATIO: 11 (ref 10–24)
BUN: 19 mg/dL (ref 8–27)
CHLORIDE: 100 mmol/L (ref 96–106)
CO2: 30 mmol/L — ABNORMAL HIGH (ref 20–29)
Calcium: 9 mg/dL (ref 8.6–10.2)
Creatinine, Ser: 1.8 mg/dL — ABNORMAL HIGH (ref 0.76–1.27)
GFR calc non Af Amer: 37 mL/min/{1.73_m2} — ABNORMAL LOW (ref >59)
GFR, EST AFRICAN AMERICAN: 43 mL/min/{1.73_m2} — AB (ref >59)
GLOBULIN, TOTAL: 2.3 g/dL (ref 1.5–4.5)
Glucose: 96 mg/dL (ref 65–99)
POTASSIUM: 3.4 mmol/L — AB (ref 3.5–5.2)
Sodium: 143 mmol/L (ref 134–144)
TOTAL PROTEIN: 6.2 g/dL (ref 6.0–8.5)

## 2017-08-17 LAB — LIPID PANEL
Chol/HDL Ratio: 2.5 ratio (ref 0.0–5.0)
Cholesterol, Total: 98 mg/dL — ABNORMAL LOW (ref 100–199)
HDL: 40 mg/dL (ref >39)
LDL Calculated: 43 mg/dL (ref 0–99)
Triglycerides: 77 mg/dL (ref 0–149)
VLDL Cholesterol Cal: 15 mg/dL (ref 5–40)

## 2017-11-15 ENCOUNTER — Ambulatory Visit (INDEPENDENT_AMBULATORY_CARE_PROVIDER_SITE_OTHER): Payer: Medicare Other | Admitting: Nurse Practitioner

## 2017-11-15 ENCOUNTER — Encounter: Payer: Self-pay | Admitting: Nurse Practitioner

## 2017-11-15 VITALS — BP 140/75 | HR 75 | Temp 96.7°F | Ht 71.0 in | Wt 208.0 lb

## 2017-11-15 DIAGNOSIS — R739 Hyperglycemia, unspecified: Secondary | ICD-10-CM | POA: Diagnosis not present

## 2017-11-15 DIAGNOSIS — E782 Mixed hyperlipidemia: Secondary | ICD-10-CM

## 2017-11-15 DIAGNOSIS — Z6828 Body mass index (BMI) 28.0-28.9, adult: Secondary | ICD-10-CM

## 2017-11-15 DIAGNOSIS — N529 Male erectile dysfunction, unspecified: Secondary | ICD-10-CM | POA: Diagnosis not present

## 2017-11-15 DIAGNOSIS — I10 Essential (primary) hypertension: Secondary | ICD-10-CM | POA: Diagnosis not present

## 2017-11-15 DIAGNOSIS — F325 Major depressive disorder, single episode, in full remission: Secondary | ICD-10-CM

## 2017-11-15 LAB — CMP14+EGFR
A/G RATIO: 1.6 (ref 1.2–2.2)
ALT: 10 IU/L (ref 0–44)
AST: 13 IU/L (ref 0–40)
Albumin: 3.7 g/dL (ref 3.5–4.8)
Alkaline Phosphatase: 90 IU/L (ref 39–117)
BUN/Creatinine Ratio: 10 (ref 10–24)
BUN: 17 mg/dL (ref 8–27)
Bilirubin Total: 0.5 mg/dL (ref 0.0–1.2)
CALCIUM: 9.2 mg/dL (ref 8.6–10.2)
CO2: 27 mmol/L (ref 20–29)
Chloride: 102 mmol/L (ref 96–106)
Creatinine, Ser: 1.77 mg/dL — ABNORMAL HIGH (ref 0.76–1.27)
GFR, EST AFRICAN AMERICAN: 44 mL/min/{1.73_m2} — AB (ref >59)
GFR, EST NON AFRICAN AMERICAN: 38 mL/min/{1.73_m2} — AB (ref >59)
Globulin, Total: 2.3 g/dL (ref 1.5–4.5)
Glucose: 122 mg/dL — ABNORMAL HIGH (ref 65–99)
POTASSIUM: 3.6 mmol/L (ref 3.5–5.2)
Sodium: 146 mmol/L — ABNORMAL HIGH (ref 134–144)
TOTAL PROTEIN: 6 g/dL (ref 6.0–8.5)

## 2017-11-15 LAB — LIPID PANEL
CHOL/HDL RATIO: 2.6 ratio (ref 0.0–5.0)
Cholesterol, Total: 125 mg/dL (ref 100–199)
HDL: 48 mg/dL (ref >39)
LDL Calculated: 61 mg/dL (ref 0–99)
TRIGLYCERIDES: 79 mg/dL (ref 0–149)
VLDL CHOLESTEROL CAL: 16 mg/dL (ref 5–40)

## 2017-11-15 NOTE — Patient Instructions (Signed)
Stress and Stress Management Stress is a normal reaction to life events. It is what you feel when life demands more than you are used to or more than you can handle. Some stress can be useful. For example, the stress reaction can help you catch the last bus of the day, study for a test, or meet a deadline at work. But stress that occurs too often or for too long can cause problems. It can affect your emotional health and interfere with relationships and normal daily activities. Too much stress can weaken your immune system and increase your risk for physical illness. If you already have a medical problem, stress can make it worse. What are the causes? All sorts of life events may cause stress. An event that causes stress for one person may not be stressful for another person. Major life events commonly cause stress. These may be positive or negative. Examples include losing your job, moving into a new home, getting married, having a baby, or losing a loved one. Less obvious life events may also cause stress, especially if they occur day after day or in combination. Examples include working long hours, driving in traffic, caring for children, being in debt, or being in a difficult relationship. What are the signs or symptoms? Stress may cause emotional symptoms including, the following:  Anxiety. This is feeling worried, afraid, on edge, overwhelmed, or out of control.  Anger. This is feeling irritated or impatient.  Depression. This is feeling sad, down, helpless, or guilty.  Difficulty focusing, remembering, or making decisions.  Stress may cause physical symptoms, including the following:  Aches and pains. These may affect your head, neck, back, stomach, or other areas of your body.  Tight muscles or clenched jaw.  Low energy or trouble sleeping.  Stress may cause unhealthy behaviors, including the following:  Eating to feel better (overeating) or skipping meals.  Sleeping too little,  too much, or both.  Working too much or putting off tasks (procrastination).  Smoking, drinking alcohol, or using drugs to feel better.  How is this diagnosed? Stress is diagnosed through an assessment by your health care provider. Your health care provider will ask questions about your symptoms and any stressful life events.Your health care provider will also ask about your medical history and may order blood tests or other tests. Certain medical conditions and medicine can cause physical symptoms similar to stress. Mental illness can cause emotional symptoms and unhealthy behaviors similar to stress. Your health care provider may refer you to a mental health professional for further evaluation. How is this treated? Stress management is the recommended treatment for stress.The goals of stress management are reducing stressful life events and coping with stress in healthy ways. Techniques for reducing stressful life events include the following:  Stress identification. Self-monitor for stress and identify what causes stress for you. These skills may help you to avoid some stressful events.  Time management. Set your priorities, keep a calendar of events, and learn to say "no." These tools can help you avoid making too many commitments.  Techniques for coping with stress include the following:  Rethinking the problem. Try to think realistically about stressful events rather than ignoring them or overreacting. Try to find the positives in a stressful situation rather than focusing on the negatives.  Exercise. Physical exercise can release both physical and emotional tension. The key is to find a form of exercise you enjoy and do it regularly.  Relaxation techniques. These relax the body and  mind. Examples include yoga, meditation, tai chi, biofeedback, deep breathing, progressive muscle relaxation, listening to music, being out in nature, journaling, and other hobbies. Again, the key is to find  one or more that you enjoy and can do regularly.  Healthy lifestyle. Eat a balanced diet, get plenty of sleep, and do not smoke. Avoid using alcohol or drugs to relax.  Strong support network. Spend time with family, friends, or other people you enjoy being around.Express your feelings and talk things over with someone you trust.  Counseling or talktherapy with a mental health professional may be helpful if you are having difficulty managing stress on your own. Medicine is typically not recommended for the treatment of stress.Talk to your health care provider if you think you need medicine for symptoms of stress. Follow these instructions at home:  Keep all follow-up visits as directed by your health care provider.  Take all medicines as directed by your health care provider. Contact a health care provider if:  Your symptoms get worse or you start having new symptoms.  You feel overwhelmed by your problems and can no longer manage them on your own. Get help right away if:  You feel like hurting yourself or someone else. This information is not intended to replace advice given to you by your health care provider. Make sure you discuss any questions you have with your health care provider. Document Released: 01/13/2001 Document Revised: 12/26/2015 Document Reviewed: 03/14/2013 Elsevier Interactive Patient Education  2017 Elsevier Inc.  

## 2017-11-15 NOTE — Progress Notes (Signed)
Subjective:    Patient ID: Dean Mcconnell, male    DOB: Jan 20, 1946, 72 y.o.   MRN: [id number]  HPI  Dean Mcconnell is here today for follow up of chronic medical problem.  Outpatient Encounter Medications as of 11/15/2017  Medication Sig  . atorvastatin (LIPITOR) 40 MG tablet Take 1 tablet (40 mg total) by mouth daily.  . hydrochlorothiazide (HYDRODIURIL) 25 MG tablet Take 1 tablet (25 mg total) by mouth daily.  Marland Kitchen losartan (COZAAR) 100 MG tablet Take 1 tablet (100 mg total) by mouth daily.  . sildenafil (VIAGRA) 100 MG tablet 1 po prn     1. Essential hypertension  No c/o chest pain, sob or headache. Does not check blood pressure at home. BP Readings from Last 3 Encounters:  08/16/17 109/63  04/27/17 123/69  01/28/17 120/62     2. Mixed hyperlipidemia  Does not watch diet  3. Major depressive disorder with single episode, in full remission [facility])  is currently  Not on any meds  4. BMI 28.0-28.9,adult  No recent wacth changes  5. Erectile dysfunction, unspecified erectile dysfunction type  Uses viagra as needed    New complaints: None today  Social history: Lives alone. His lady friend recently broke up with him.    Review of Systems  Constitutional: Negative for activity change and appetite change.  HENT: Negative.   Eyes: Negative for pain.  Respiratory: Negative for shortness of breath.   Cardiovascular: Negative for chest pain, palpitations and leg swelling.  Gastrointestinal: Negative for abdominal pain.  Endocrine: Negative for polydipsia.  Genitourinary: Negative.   Skin: Negative for rash.  Neurological: Negative for dizziness, weakness and headaches.  Hematological: Does not bruise/bleed easily.  Psychiatric/Behavioral: Negative.   All other systems reviewed and are negative.      Objective:   Physical Exam  Constitutional: He is oriented to person, place, and time. He appears well-developed and well-nourished.  HENT:  Head: Normocephalic.  Right  Ear: External ear normal.  Left Ear: External ear normal.  Nose: Nose normal.  Mouth/Throat: Oropharynx is clear and moist.  Eyes: Pupils are equal, round, and reactive to light. EOM are normal.  Neck: Normal range of motion. Neck supple. No JVD present. No thyromegaly present.  Cardiovascular: Normal rate, regular rhythm, normal heart sounds and intact distal pulses. Exam reveals no gallop and no friction rub.  No murmur heard. Pulmonary/Chest: Effort normal and breath sounds normal. No respiratory distress. He has no wheezes. He has no rales. He exhibits no tenderness.  Abdominal: Soft. Bowel sounds are normal. He exhibits no mass. There is no tenderness.  Genitourinary: Prostate normal and penis normal.  Musculoskeletal: Normal range of motion. He exhibits no edema.  Lymphadenopathy:    He has no cervical adenopathy.  Neurological: He is alert and oriented to person, place, and time. No cranial nerve deficit.  Skin: Skin is warm and dry.  Psychiatric: He has a normal mood and affect. His behavior is normal. Judgment and thought content normal.   BP 140/75   Pulse 75   Temp (!) 96.7 F (35.9 C) (Oral)   Ht '5\' 11"'$  (1.803 m)   Wt 208 lb (94.3 kg)   BMI 29.01 kg/m         Assessment & Plan:  1. Essential hypertension Low sodium diet - CMP14+EGFR  2. Mixed hyperlipidemia Low fat diet - Lipid panel  3. Major depressive disorder with single episode, in full remission [facility]) Stress management Patient will lwt me know if  needs to be on anything  4. BMI 28.0-28.9,adult Discussed diet and exercise for person with BMI >25 Will recheck weight in 3-6 months  5. Erectile dysfunction, unspecified erectile dysfunction type viagra as needed    Labs pending Health maintenance reviewed Diet and exercise encouraged Continue all meds Follow up  In 3 months   [place], FNP

## 2017-11-16 ENCOUNTER — Telehealth: Payer: Self-pay | Admitting: Nurse Practitioner

## 2017-11-16 NOTE — Telephone Encounter (Signed)
Patient aware of lab results.

## 2017-11-16 NOTE — Addendum Note (Signed)
Addended by: Chevis Pretty on: 11/16/2017 12:09 PM   Modules accepted: Orders

## 2017-11-18 DIAGNOSIS — R739 Hyperglycemia, unspecified: Secondary | ICD-10-CM | POA: Diagnosis not present

## 2017-11-18 LAB — BAYER DCA HB A1C WAIVED: HB A1C (BAYER DCA - WAIVED): 5.5 % (ref <7.0)

## 2017-11-18 NOTE — Addendum Note (Signed)
Addended by: Earlene Plater on: 11/18/2017 10:11 AM   Modules accepted: Orders

## 2018-01-25 ENCOUNTER — Ambulatory Visit: Payer: Medicare Other | Admitting: *Deleted

## 2018-02-22 ENCOUNTER — Encounter: Payer: Self-pay | Admitting: Nurse Practitioner

## 2018-02-22 ENCOUNTER — Ambulatory Visit (INDEPENDENT_AMBULATORY_CARE_PROVIDER_SITE_OTHER): Payer: Medicare Other | Admitting: Nurse Practitioner

## 2018-02-22 VITALS — BP 139/77 | HR 69 | Temp 96.7°F | Ht 71.0 in | Wt 213.0 lb

## 2018-02-22 DIAGNOSIS — E782 Mixed hyperlipidemia: Secondary | ICD-10-CM

## 2018-02-22 DIAGNOSIS — N529 Male erectile dysfunction, unspecified: Secondary | ICD-10-CM | POA: Diagnosis not present

## 2018-02-22 DIAGNOSIS — F17200 Nicotine dependence, unspecified, uncomplicated: Secondary | ICD-10-CM | POA: Insufficient documentation

## 2018-02-22 DIAGNOSIS — F325 Major depressive disorder, single episode, in full remission: Secondary | ICD-10-CM

## 2018-02-22 DIAGNOSIS — L989 Disorder of the skin and subcutaneous tissue, unspecified: Secondary | ICD-10-CM

## 2018-02-22 DIAGNOSIS — Z1211 Encounter for screening for malignant neoplasm of colon: Secondary | ICD-10-CM

## 2018-02-22 DIAGNOSIS — I10 Essential (primary) hypertension: Secondary | ICD-10-CM | POA: Diagnosis not present

## 2018-02-22 DIAGNOSIS — Z6828 Body mass index (BMI) 28.0-28.9, adult: Secondary | ICD-10-CM

## 2018-02-22 DIAGNOSIS — Z1212 Encounter for screening for malignant neoplasm of rectum: Secondary | ICD-10-CM

## 2018-02-22 MED ORDER — HYDROCHLOROTHIAZIDE 25 MG PO TABS: 25.0000 mg | ORAL_TABLET | Freq: Every day | ORAL | 1 refills | Status: DC

## 2018-02-22 MED ORDER — LOSARTAN POTASSIUM 100 MG PO TABS: 100.0000 mg | ORAL_TABLET | Freq: Every day | ORAL | 1 refills | Status: DC

## 2018-02-22 MED ORDER — ATORVASTATIN CALCIUM 40 MG PO TABS: 40.0000 mg | ORAL_TABLET | Freq: Every day | ORAL | 1 refills | Status: DC

## 2018-02-22 NOTE — Progress Notes (Addendum)
Subjective:    Patient ID: Gearold Wainer, male    DOB: 03-22-1946, 72 y.o.   MRN: [id number]   Chief Complaint: Medical Management of Chronic Issues   HPI:  1. Essential hypertension  No c/o chest pain, sob or headache. Does not check blood pressure at home. BP Readings from Last 3 Encounters:  02/22/18 139/77  11/15/17 140/75  08/16/17 109/63     2. Erectile dysfunction, unspecified erectile dysfunction type  Takes viagra as needed.  3. Mixed hyperlipidemia  Has not been watching his diet lately.  4. Major depressive disorder with single episode, in full remission ([place]) Is currently not taking anything. Says he is doing well right n ow. Depression screen [facility] 2/9 02/22/2018 11/15/2017 08/16/2017  Decreased Interest 0 0 0  Down, Depressed, Hopeless 0 0 0  PHQ - 2 Score 0 0 0  Altered sleeping - - -  Tired, decreased energy - - -  Change in appetite - - -  Feeling bad or failure about yourself  - - -  Trouble concentrating - - -  Moving slowly or fidgety/restless - - -  Suicidal thoughts - - -  PHQ-9 Score - - -  Difficult doing work/chores - - -     5. BMI 28.0-28.9,adult  Has gained 5 lbs since last visit  6.     smoker         Smokes about a pack a day.   Outpatient Encounter Medications as of 02/22/2018  Medication Sig  . atorvastatin (LIPITOR) 40 MG tablet Take 1 tablet (40 mg total) by mouth daily.  . hydrochlorothiazide (HYDRODIURIL) 25 MG tablet Take 1 tablet (25 mg total) by mouth daily.  Marland Kitchen losartan (COZAAR) 100 MG tablet Take 1 tablet (100 mg total) by mouth daily.  . sildenafil (VIAGRA) 100 MG tablet 1 po prn       New complaints: None today  Social history: Lives alone- stays very active and busy    Review of Systems  Constitutional: Negative for activity change and appetite change.  HENT: Negative.   Eyes: Negative for pain.  Respiratory: Negative for shortness of breath.   Cardiovascular: Negative for chest pain, palpitations and leg  swelling.  Gastrointestinal: Negative for abdominal pain.  Endocrine: Negative for polydipsia.  Genitourinary: Negative.  Negative for difficulty urinating.  Skin: Negative for rash.  Neurological: Negative for dizziness, weakness and headaches.  Hematological: Does not bruise/bleed easily.  Psychiatric/Behavioral: Negative.   All other systems reviewed and are negative.      Objective:   Physical Exam  Constitutional: He is oriented to person, place, and time. He appears well-developed and well-nourished. No distress.  HENT:  Head: Normocephalic.  Nose: Nose normal.  Mouth/Throat: Oropharynx is clear and moist.  Eyes: Pupils are equal, round, and reactive to light. EOM are normal.  Neck: Normal range of motion and phonation normal. Neck supple. No JVD present. Carotid bruit is not present. No thyroid mass and no thyromegaly present.  Cardiovascular: Normal rate and regular rhythm.  Pulmonary/Chest: Effort normal and breath sounds normal. No respiratory distress.  Abdominal: Soft. Normal appearance, normal aorta and bowel sounds are normal. There is no tenderness.  Musculoskeletal: Normal range of motion.  Lymphadenopathy:    He has no cervical adenopathy.  Neurological: He is alert and oriented to person, place, and time.  Skin: Skin is warm and dry.  2cm mole on left temple  Psychiatric: He has a normal mood and affect. His behavior is normal.  Judgment and thought content normal.      BP 139/77   Pulse 69   Temp (!) 96.7 F (35.9 C) (Oral)   Ht _0  (1.803 m)   Wt 213 lb (96.6 kg)   BMI 29.71 kg/m      Assessment & Plan:  Kelley Knoth comes in today with chief complaint of Medical Management of Chronic Issues   Diagnosis and orders addressed:  1. Essential hypertension Low sodium diet - hydrochlorothiazide (HYDRODIURIL) 25 MG tablet; Take 1 tablet (25 mg total) by mouth daily.  Dispense: 90 tablet; Refill: 1 - losartan (COZAAR) 100 MG tablet; Take 1 tablet  (100 mg total) by mouth daily.  Dispense: 90 tablet; Refill: 1 - CMP14+EGFR  2. Erectile dysfunction, unspecified erectile dysfunction type - PSA, total and free  3. Mixed hyperlipidemia Low fat diet - atorvastatin (LIPITOR) 40 MG tablet; Take 1 tablet (40 mg total) by mouth daily.  Dispense: 90 tablet; Refill: 1 - Lipid panel  4. Major depressive disorder with single episode, in full remission [facility]) Stress management  5. BMI 28.0-28.9,adult Discussed diet and exercise for person with BMI >25 Will recheck weight in 3-6 months  6. Smoker Smoking cessation encouraged  7. Facial lesion Referral to dermatology   Labs pending Health Maintenance reviewed Diet and exercise encouraged  Follow up plan: 3 months   Mary-Margaret Hassell Done, FNP

## 2018-02-22 NOTE — Addendum Note (Signed)
Addended by: Chevis Pretty on: 02/22/2018 10:56 AM   Modules accepted: Orders

## 2018-02-22 NOTE — Patient Instructions (Signed)
Steps to Quit Smoking Smoking tobacco can be bad for your health. It can also affect almost every organ in your body. Smoking puts you and people around you at risk for many serious long-lasting (chronic) diseases. Quitting smoking is hard, but it is one of the best things that you can do for your health. It is never too late to quit. What are the benefits of quitting smoking? When you quit smoking, you lower your risk for getting serious diseases and conditions. They can include:  Lung cancer or lung disease.  Heart disease.  Stroke.  Heart attack.  Not being able to have children (infertility).  Weak bones (osteoporosis) and broken bones (fractures).  If you have coughing, wheezing, and shortness of breath, those symptoms may get better when you quit. You may also get sick less often. If you are pregnant, quitting smoking can help to lower your chances of having a baby of low birth weight. What can I do to help me quit smoking? Talk with your doctor about what can help you quit smoking. Some things you can do (strategies) include:  Quitting smoking totally, instead of slowly cutting back how much you smoke over a period of time.  Going to in-person counseling. You are more likely to quit if you go to many counseling sessions.  Using resources and support systems, such as: ? Online chats with a counselor. ? Phone quitlines. ? Printed self-help materials. ? Support groups or group counseling. ? Text messaging programs. ? Mobile phone apps or applications.  Taking medicines. Some of these medicines may have nicotine in them. If you are pregnant or breastfeeding, do not take any medicines to quit smoking unless your doctor says it is okay. Talk with your doctor about counseling or other things that can help you.  Talk with your doctor about using more than one strategy at the same time, such as taking medicines while you are also going to in-person counseling. This can help make  quitting easier. What things can I do to make it easier to quit? Quitting smoking might feel very hard at first, but there is a lot that you can do to make it easier. Take these steps:  Talk to your family and friends. Ask them to support and encourage you.  Call phone quitlines, reach out to support groups, or work with a counselor.  Ask people who smoke to not smoke around you.  Avoid places that make you want (trigger) to smoke, such as: ? Bars. ? Parties. ? Smoke-break areas at work.  Spend time with people who do not smoke.  Lower the stress in your life. Stress can make you want to smoke. Try these things to help your stress: ? Getting regular exercise. ? Deep-breathing exercises. ? Yoga. ? Meditating. ? Doing a body scan. To do this, close your eyes, focus on one area of your body at a time from head to toe, and notice which parts of your body are tense. Try to relax the muscles in those areas.  Download or buy apps on your mobile phone or tablet that can help you stick to your quit plan. There are many free apps, such as QuitGuide from the CDC (Centers for Disease Control and Prevention). You can find more support from smokefree.gov and other websites.  This information is not intended to replace advice given to you by your health care provider. Make sure you discuss any questions you have with your health care provider. Document Released: 05/16/2009 Document   Revised: 03/17/2016 Document Reviewed: 12/04/2014 Elsevier Interactive Patient Education  2018 Elsevier Inc.  

## 2018-02-23 LAB — PSA, TOTAL AND FREE
PROSTATE SPECIFIC AG, SERUM: 0.4 ng/mL (ref 0.0–4.0)
PSA FREE: 0.15 ng/mL
PSA, Free Pct: 37.5 %

## 2018-02-23 LAB — CMP14+EGFR
ALBUMIN: 3.9 g/dL (ref 3.5–4.8)
ALK PHOS: 90 IU/L (ref 39–117)
ALT: 9 IU/L (ref 0–44)
AST: 9 IU/L (ref 0–40)
Albumin/Globulin Ratio: 1.8 (ref 1.2–2.2)
BILIRUBIN TOTAL: 0.3 mg/dL (ref 0.0–1.2)
BUN / CREAT RATIO: 14 (ref 10–24)
BUN: 22 mg/dL (ref 8–27)
CO2: 27 mmol/L (ref 20–29)
Calcium: 9.5 mg/dL (ref 8.6–10.2)
Chloride: 100 mmol/L (ref 96–106)
Creatinine, Ser: 1.53 mg/dL — ABNORMAL HIGH (ref 0.76–1.27)
GFR, EST AFRICAN AMERICAN: 52 mL/min/{1.73_m2} — AB (ref >59)
GFR, EST NON AFRICAN AMERICAN: 45 mL/min/{1.73_m2} — AB (ref >59)
GLUCOSE: 91 mg/dL (ref 65–99)
Globulin, Total: 2.2 g/dL (ref 1.5–4.5)
POTASSIUM: 4.1 mmol/L (ref 3.5–5.2)
Sodium: 144 mmol/L (ref 134–144)
TOTAL PROTEIN: 6.1 g/dL (ref 6.0–8.5)

## 2018-02-23 LAB — LIPID PANEL
Chol/HDL Ratio: 2.7 ratio (ref 0.0–5.0)
Cholesterol, Total: 121 mg/dL (ref 100–199)
HDL: 45 mg/dL (ref >39)
LDL Calculated: 57 mg/dL (ref 0–99)
Triglycerides: 95 mg/dL (ref 0–149)
VLDL Cholesterol Cal: 19 mg/dL (ref 5–40)

## 2018-03-14 ENCOUNTER — Encounter: Payer: Medicare Other | Admitting: *Deleted

## 2018-03-30 ENCOUNTER — Telehealth: Payer: Self-pay | Admitting: Nurse Practitioner

## 2018-04-01 NOTE — Telephone Encounter (Signed)
Left message to call back  

## 2018-04-01 NOTE — Telephone Encounter (Signed)
Will have to be seen

## 2018-04-01 NOTE — Telephone Encounter (Signed)
Pt is calling

## 2018-04-01 NOTE — Telephone Encounter (Signed)
Do you have a form requesting for knee brace

## 2018-04-08 ENCOUNTER — Encounter: Payer: Self-pay | Admitting: *Deleted

## 2018-04-08 ENCOUNTER — Ambulatory Visit (INDEPENDENT_AMBULATORY_CARE_PROVIDER_SITE_OTHER): Payer: Medicare Other | Admitting: *Deleted

## 2018-04-08 ENCOUNTER — Telehealth: Payer: Self-pay | Admitting: Nurse Practitioner

## 2018-04-08 VITALS — BP 130/70 | HR 80 | Ht 71.0 in | Wt 214.0 lb

## 2018-04-08 DIAGNOSIS — Z0000 Encounter for general adult medical examination without abnormal findings: Secondary | ICD-10-CM | POA: Diagnosis not present

## 2018-04-08 DIAGNOSIS — Z1212 Encounter for screening for malignant neoplasm of rectum: Secondary | ICD-10-CM

## 2018-04-08 DIAGNOSIS — Z1211 Encounter for screening for malignant neoplasm of colon: Secondary | ICD-10-CM

## 2018-04-08 NOTE — Progress Notes (Addendum)
Subjective:   Dean Mcconnell is a 72 y.o. male who presents for a Medicare Annual Wellness Visit. He's lived at home alone since his wife passed away suddenly 6 years ago.  He has one adult daughter and 3 adult grandchildren. Daughter visits and talks with him often. He has two sisters and one of them lives down the street. He talks with them daily. 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    Push mows yard and stays active around his house cooking and cleaning  Review of Systems    Patient reports that his overall health is unchanged compared to last year.  Cardiac Risk Factors include: advanced age (>43men, >70 women);dyslipidemia;male gender;hypertension;smoking/ tobacco exposure  Musculoskeletal: Right knee pain and stiffness after activity. It is tolerable. He doesn't take anything for it.   All other systems negative       Current Medications (verified) Outpatient Encounter Medications as of 04/08/2018  Medication Sig  . atorvastatin (LIPITOR) 40 MG tablet Take 1 tablet (40 mg total) by mouth daily.  . hydrochlorothiazide (HYDRODIURIL) 25 MG tablet Take 1 tablet (25 mg total) by mouth daily.  Marland Kitchen losartan (COZAAR) 100 MG tablet Take 1 tablet (100 mg total) by mouth daily.  . sildenafil (VIAGRA) 100 MG tablet 1 po prn   No facility-administered encounter medications on file as of 04/08/2018.     Allergies (verified) Penicillins   History: Past Medical History:  Diagnosis Date  . Cancer ([place])    melanoma on nose  . Hyperlipidemia   . Hypertension    Past Surgical History:  Procedure Laterality Date  . SKIN CANCER EXCISION     nose   Family History  Problem Relation Age of Onset  . Hypertension Father    Social History   Socioeconomic History  . Marital status: Widowed    Spouse name: Not on file  . Number of children: 1  . Years of education: GED  . Highest education level: GED or equivalent  Occupational History  . Occupation: Retired     Comment: Land  Social Needs  . Financial resource strain: Not hard at all  . Food insecurity:    Worry: Never true    Inability: Never true  . Transportation needs:    Medical: No    Non-medical: No  Tobacco Use  . Smoking status: Heavy Tobacco Smoker    Packs/day: 1.00    Years: 30.00    Pack years: 30.00    Types: Cigarettes  . Smokeless tobacco: Former Systems developer    Types: Chew  . Tobacco comment: Patient states that he no longer inhales  Substance and Sexual Activity  . Alcohol use: No  . Drug use: No  . Sexual activity: Yes  Lifestyle  . Physical activity:    Days per week: 5 days    Minutes per session: 30 min  . Stress: Only a little  Relationships  . Social connections:    Talks on phone: More than three times a week    Gets together: More than three times a week    Attends religious service: Never    Active member of club or organization: No    Attends meetings of clubs or organizations: Never    Relationship status: Widowed  Other Topics Concern  . Not on file  Social History Narrative  . Not on file    Tobacco Use No.  Clinical Intake:     Pain : No/denies pain  Nutritional Status: BMI 25 -29 Overweight Diabetes: No  How often do you need to have someone help you when you read instructions, pamphlets, or other written materials from your doctor or pharmacy?: 1 - Never What is the last grade level you completed in school?: GED  Interpreter Needed?: No  Information entered by :: Chong Sicilian, RN  Activities of Daily Living In your present state of health, do you have any difficulty performing the following activities: 04/08/2018  Hearing? Y  Comment has some difficulty with hearing clearly  Vision? N  Difficulty concentrating or making decisions? N  Walking or climbing stairs? N  Dressing or bathing? N  Doing errands, shopping? N  Preparing Food and eating ? N  Using the Toilet? N  In the past six months, have you accidently leaked  urine? N  Do you have problems with loss of bowel control? N  Managing your Medications? N  Managing your Finances? N  Housekeeping or managing your Housekeeping? N  Some recent data might be hidden      Diet Breakfast-coffee Lunch-sandwich Supper-Fixes meals at home Drinks water, coffee, and tea. Has stopped drinking soda.  Makes things like Spaghetti or chili and will freeze what he has leftover.   Exercise Current Exercise Habits: The patient does not participate in regular exercise at present, Exercise limited by: orthopedic condition(s)    Depression Screen PHQ 2/9 Scores 04/08/2018 02/22/2018 11/15/2017 08/16/2017  PHQ - 2 Score 0 0 0 0  PHQ- 9 Score - - - -     Fall Risk Fall Risk  04/08/2018 02/22/2018 11/15/2017 08/16/2017 04/27/2017  Falls in the past year? No No No No No    Safety Is the patient's home free of loose throw rugs in walkways, pet beds, electrical cords, etc?   yes      Grab bars in the bathroom? yes      Walkin shower? yes      Shower Seat? yes      Handrails on the stairs?   yes      Adequate lighting?   yes  Patient Care Team: Chevis Pretty, FNP as PCP - General (Nurse Practitioner) Harlen Labs, MD as Referring Physician [facility])  Hospitalizations, surgeries, and ER visits in previous 12 months No hospitalizations, ER visits, or surgeries this past year.  Objective:    Today's Vitals   04/08/18 1013  BP: 130/70  Pulse: 80  Weight: 214 lb (97.1 kg)  Height: 5\' 11"  (1.803 m)   Body mass index is 29.85 kg/m.  Advanced Directives 04/08/2018 01/20/2017 11/29/2014 09/10/2014 06/07/2014  Does Patient Have a Medical Advance Directive? Yes No No No No  Type of Paramedic of [place];Living will - - - -  Does patient want to make changes to medical advance directive? No - Patient declined Yes (MAU/Ambulatory/Procedural Areas - Information given) - - -  Copy of [place] in Chart? No - copy  requested - - - -  Would patient like information on creating a medical advance directive? - Yes (MAU/Ambulatory/Procedural Areas - Information given) (No Data) Yes - Educational materials given Yes - Educational materials given    Hearing/Vision  No hearing or vision deficits noted during visit.   Cognitive Function: MMSE - Mini Mental State Exam 04/08/2018 01/20/2017 11/29/2014  Orientation to time 5 5 5   Orientation to Place 5 5 5   Registration 3 3 3   Attention/ Calculation 5 5 5   Recall 2 2 3  Language- name 2 objects 2 2 2   Language- repeat 1 1 1   Language- follow 3 step command 3 3 3   Language- read & follow direction 1 1 1   Write a sentence 1 0 1  Copy design 1 0 1  Total score 29 27 30        Normal Cognitive Function Screening: Yes    Immunizations and Health Maintenance Immunization History  Administered Date(s) Administered  . Td 09/04/2009   Health Maintenance Due  Topic Date Due  . Fecal DNA (Cologuard)  01/23/1997   Health Maintenance  Topic Date Due  . Fecal DNA (Cologuard)  01/23/1997  . TETANUS/TDAP  09/05/2019  . Hepatitis C Screening  Completed  . INFLUENZA VACCINE  Discontinued  . PNA vac Low Risk Adult  Discontinued        Assessment:   This is a routine wellness examination for [place].      Plan:    Goals    . Exercise 3x per week (30 min per time)        Health Maintenance Recommendations: Colorectal cancer screening-Cologuard ordered  Additional Screening Recommendations: Lung: Low Dose CT Chest recommended if Age 54-80 years, 30 pack-year currently smoking OR have quit w/in 15years. Patient does not qualify. Hepatitis C Screening recommended: no-completed  Today's Orders Orders Placed This Encounter  Procedures  . Cologuard    Keep f/u with Chevis Pretty, FNP and any other specialty appointments you may have Continue current medications Move carefully to avoid falls. Use assistive devices like a cane or walker if  needed. Aim for at least 150 minutes of moderate activity a week. This can be chair exercises if necessary. Reading or puzzles are a good way to exercise your brain Stay connected with friends and family. Social connections are beneficial to your emotional and mental health.   I have personally reviewed and noted the following in the patient's chart:   . Medical and social history . Use of alcohol, tobacco or illicit drugs  . Current medications and supplements . Functional ability and status . Nutritional status . Physical activity . Advanced directives . List of other physicians . Hospitalizations, surgeries, and ER visits in previous 12 months . Vitals . Screenings to include cognitive, depression, and falls . Referrals and appointments  In addition, I have reviewed and discussed with patient certain preventive protocols, quality metrics, and best practice recommendations. A written personalized care plan for preventive services as well as general preventive health recommendations were provided to patient.     Chong Sicilian, RN   04/08/2018

## 2018-04-08 NOTE — Telephone Encounter (Signed)
Patient needs to be seen to discuss need for knee brace he saw on TV.  Offered an appointment, but he states he has appointment in October and will wait until then.

## 2018-04-08 NOTE — Patient Instructions (Signed)
  [name] , Thank you for taking time to come for your Medicare Wellness Visit. I appreciate your ongoing commitment to your health goals. Please review the following plan we discussed and let me know if I can assist you in the future.   These are the goals we discussed: Goals    . Exercise 3x per week (30 min per time)       This is a list of the screening recommended for you and due dates:  Health Maintenance  Topic Date Due  . Cologuard (Stool DNA test)  01/23/1997  . Tetanus Vaccine  09/05/2019  .  Hepatitis C: One time screening is recommended by Center for Disease Control  (CDC) for  adults born from [birth year] through 1964.   Completed  . Flu Shot  Discontinued  . Pneumonia vaccines  Discontinued

## 2018-05-19 NOTE — Telephone Encounter (Signed)
Patient seen since note.  This encounter will now be closed

## 2018-05-30 ENCOUNTER — Encounter: Payer: Self-pay | Admitting: Nurse Practitioner

## 2018-05-30 ENCOUNTER — Ambulatory Visit (INDEPENDENT_AMBULATORY_CARE_PROVIDER_SITE_OTHER): Payer: Medicare Other | Admitting: Nurse Practitioner

## 2018-05-30 ENCOUNTER — Encounter (INDEPENDENT_AMBULATORY_CARE_PROVIDER_SITE_OTHER): Payer: Self-pay

## 2018-05-30 VITALS — BP 126/77 | HR 68 | Temp 96.7°F | Ht 71.0 in | Wt 213.0 lb

## 2018-05-30 DIAGNOSIS — E782 Mixed hyperlipidemia: Secondary | ICD-10-CM | POA: Diagnosis not present

## 2018-05-30 DIAGNOSIS — I10 Essential (primary) hypertension: Secondary | ICD-10-CM | POA: Diagnosis not present

## 2018-05-30 DIAGNOSIS — Z6828 Body mass index (BMI) 28.0-28.9, adult: Secondary | ICD-10-CM | POA: Diagnosis not present

## 2018-05-30 DIAGNOSIS — F17200 Nicotine dependence, unspecified, uncomplicated: Secondary | ICD-10-CM | POA: Diagnosis not present

## 2018-05-30 DIAGNOSIS — F325 Major depressive disorder, single episode, in full remission: Secondary | ICD-10-CM

## 2018-05-30 LAB — LIPID PANEL
CHOL/HDL RATIO: 3.1 ratio (ref 0.0–5.0)
Cholesterol, Total: 129 mg/dL (ref 100–199)
HDL: 42 mg/dL (ref >39)
LDL Calculated: 63 mg/dL (ref 0–99)
TRIGLYCERIDES: 118 mg/dL (ref 0–149)
VLDL Cholesterol Cal: 24 mg/dL (ref 5–40)

## 2018-05-30 LAB — CMP14+EGFR
A/G RATIO: 2.1 (ref 1.2–2.2)
ALT: 11 IU/L (ref 0–44)
AST: 14 IU/L (ref 0–40)
Albumin: 4.1 g/dL (ref 3.5–4.8)
Alkaline Phosphatase: 92 IU/L (ref 39–117)
BUN/Creatinine Ratio: 15 (ref 10–24)
BUN: 30 mg/dL — ABNORMAL HIGH (ref 8–27)
Bilirubin Total: 0.6 mg/dL (ref 0.0–1.2)
CALCIUM: 9.6 mg/dL (ref 8.6–10.2)
CO2: 26 mmol/L (ref 20–29)
CREATININE: 2 mg/dL — AB (ref 0.76–1.27)
Chloride: 101 mmol/L (ref 96–106)
GFR calc Af Amer: 38 mL/min/{1.73_m2} — ABNORMAL LOW (ref >59)
GFR, EST NON AFRICAN AMERICAN: 33 mL/min/{1.73_m2} — AB (ref >59)
GLUCOSE: 90 mg/dL (ref 65–99)
Globulin, Total: 2 g/dL (ref 1.5–4.5)
POTASSIUM: 3.5 mmol/L (ref 3.5–5.2)
Sodium: 142 mmol/L (ref 134–144)
Total Protein: 6.1 g/dL (ref 6.0–8.5)

## 2018-05-30 MED ORDER — HYDROCHLOROTHIAZIDE 25 MG PO TABS: 25.0000 mg | ORAL_TABLET | Freq: Every day | ORAL | 1 refills | Status: DC

## 2018-05-30 MED ORDER — ATORVASTATIN CALCIUM 40 MG PO TABS: 40.0000 mg | ORAL_TABLET | Freq: Every day | ORAL | 1 refills | Status: DC

## 2018-05-30 MED ORDER — LOSARTAN POTASSIUM 100 MG PO TABS: 100.0000 mg | ORAL_TABLET | Freq: Every day | ORAL | 1 refills | Status: DC

## 2018-05-30 NOTE — Patient Instructions (Signed)
Stress and Stress Management Stress is a normal reaction to life events. It is what you feel when life demands more than you are used to or more than you can handle. Some stress can be useful. For example, the stress reaction can help you catch the last bus of the day, study for a test, or meet a deadline at work. But stress that occurs too often or for too long can cause problems. It can affect your emotional health and interfere with relationships and normal daily activities. Too much stress can weaken your immune system and increase your risk for physical illness. If you already have a medical problem, stress can make it worse. What are the causes? All sorts of life events may cause stress. An event that causes stress for one person may not be stressful for another person. Major life events commonly cause stress. These may be positive or negative. Examples include losing your job, moving into a new home, getting married, having a baby, or losing a loved one. Less obvious life events may also cause stress, especially if they occur day after day or in combination. Examples include working long hours, driving in traffic, caring for children, being in debt, or being in a difficult relationship. What are the signs or symptoms? Stress may cause emotional symptoms including, the following:  Anxiety. This is feeling worried, afraid, on edge, overwhelmed, or out of control.  Anger. This is feeling irritated or impatient.  Depression. This is feeling sad, down, helpless, or guilty.  Difficulty focusing, remembering, or making decisions.  Stress may cause physical symptoms, including the following:  Aches and pains. These may affect your head, neck, back, stomach, or other areas of your body.  Tight muscles or clenched jaw.  Low energy or trouble sleeping.  Stress may cause unhealthy behaviors, including the following:  Eating to feel better (overeating) or skipping meals.  Sleeping too little,  too much, or both.  Working too much or putting off tasks (procrastination).  Smoking, drinking alcohol, or using drugs to feel better.  How is this diagnosed? Stress is diagnosed through an assessment by your health care provider. Your health care provider will ask questions about your symptoms and any stressful life events.Your health care provider will also ask about your medical history and may order blood tests or other tests. Certain medical conditions and medicine can cause physical symptoms similar to stress. Mental illness can cause emotional symptoms and unhealthy behaviors similar to stress. Your health care provider may refer you to a mental health professional for further evaluation. How is this treated? Stress management is the recommended treatment for stress.The goals of stress management are reducing stressful life events and coping with stress in healthy ways. Techniques for reducing stressful life events include the following:  Stress identification. Self-monitor for stress and identify what causes stress for you. These skills may help you to avoid some stressful events.  Time management. Set your priorities, keep a calendar of events, and learn to say "no." These tools can help you avoid making too many commitments.  Techniques for coping with stress include the following:  Rethinking the problem. Try to think realistically about stressful events rather than ignoring them or overreacting. Try to find the positives in a stressful situation rather than focusing on the negatives.  Exercise. Physical exercise can release both physical and emotional tension. The key is to find a form of exercise you enjoy and do it regularly.  Relaxation techniques. These relax the body and  mind. Examples include yoga, meditation, tai chi, biofeedback, deep breathing, progressive muscle relaxation, listening to music, being out in nature, journaling, and other hobbies. Again, the key is to find  one or more that you enjoy and can do regularly.  Healthy lifestyle. Eat a balanced diet, get plenty of sleep, and do not smoke. Avoid using alcohol or drugs to relax.  Strong support network. Spend time with family, friends, or other people you enjoy being around.Express your feelings and talk things over with someone you trust.  Counseling or talktherapy with a mental health professional may be helpful if you are having difficulty managing stress on your own. Medicine is typically not recommended for the treatment of stress.Talk to your health care provider if you think you need medicine for symptoms of stress. Follow these instructions at home:  Keep all follow-up visits as directed by your health care provider.  Take all medicines as directed by your health care provider. Contact a health care provider if:  Your symptoms get worse or you start having new symptoms.  You feel overwhelmed by your problems and can no longer manage them on your own. Get help right away if:  You feel like hurting yourself or someone else. This information is not intended to replace advice given to you by your health care provider. Make sure you discuss any questions you have with your health care provider. Document Released: 01/13/2001 Document Revised: 12/26/2015 Document Reviewed: 03/14/2013 Elsevier Interactive Patient Education  2017 Elsevier Inc.  

## 2018-05-30 NOTE — Progress Notes (Signed)
Subjective:    Patient ID: Dean Mcconnell, male    DOB: 05/12/1946, 72 y.o.   MRN: [id number]   Chief Complaint: Mediacl management of chronic issues  HPI:  1. Essential hypertension  No c/o chest pain, sob or headache. Does not check blood pressure at home. BP Readings from Last 3 Encounters:  04/08/18 130/70  02/22/18 139/77  11/15/17 140/75     2. Smoker  Smoke over 1 pack a day  3. Mixed hyperlipidemia  Does ot watch diet. Does no dedicated exercise, but stays very active.  4. BMI 28.0-28.9,adult  No recent weight chnages  5. Major depressive disorder with single episode, in full remission [facility])  he is currently on no meds and does not feel like he needs any at this time. Depression screen [facility] 2/9 05/30/2018 04/08/2018 02/22/2018  Decreased Interest 0 0 0  Down, Depressed, Hopeless 0 0 0  PHQ - 2 Score 0 0 0  Altered sleeping - - -  Tired, decreased energy - - -  Change in appetite - - -  Feeling bad or failure about yourself  - - -  Trouble concentrating - - -  Moving slowly or fidgety/restless - - -  Suicidal thoughts - - -  PHQ-9 Score - - -  Difficult doing work/chores - - -       Outpatient Encounter Medications as of 05/30/2018  Medication Sig  . atorvastatin (LIPITOR) 40 MG tablet Take 1 tablet (40 mg total) by mouth daily.  . hydrochlorothiazide (HYDRODIURIL) 25 MG tablet Take 1 tablet (25 mg total) by mouth daily.  Marland Kitchen losartan (COZAAR) 100 MG tablet Take 1 tablet (100 mg total) by mouth daily.  . sildenafil (VIAGRA) 100 MG tablet 1 po prn       New complaints: None today  Social history: Retired. Stays very active  In th ecommunity   Review of Systems  Constitutional: Negative for activity change and appetite change.  HENT: Negative.   Eyes: Negative for pain.  Respiratory: Negative for shortness of breath.   Cardiovascular: Negative for chest pain, palpitations and leg swelling.  Gastrointestinal: Negative for abdominal pain.  Endocrine:  Negative for polydipsia.  Genitourinary: Negative.   Skin: Negative for rash.  Neurological: Negative for dizziness, weakness and headaches.  Hematological: Does not bruise/bleed easily.  Psychiatric/Behavioral: Negative.   All other systems reviewed and are negative.      Objective:   Physical Exam  Constitutional: He is oriented to person, place, and time. He appears well-developed and well-nourished.  HENT:  Head: Normocephalic.  Nose: Nose normal.  Mouth/Throat: Oropharynx is clear and moist.  Eyes: Pupils are equal, round, and reactive to light. EOM are normal.  Neck: Normal range of motion and phonation normal. Neck supple. No JVD present. Carotid bruit is not present. No thyroid mass and no thyromegaly present.  Cardiovascular: Normal rate and regular rhythm.  Pulmonary/Chest: Effort normal and breath sounds normal. No respiratory distress.  Abdominal: Soft. Normal appearance, normal aorta and bowel sounds are normal. There is no tenderness.  Musculoskeletal: Normal range of motion.  Lymphadenopathy:    He has no cervical adenopathy.  Neurological: He is alert and oriented to person, place, and time.  Skin: Skin is warm and dry.  Psychiatric: He has a normal mood and affect. His behavior is normal. Judgment and thought content normal.  Nursing note and vitals reviewed.  BP 126/77   Pulse 68   Temp (!) 96.7 F (35.9 C) (Oral)  Ht 5' 11" (1.803 m)   Wt 213 lb (96.6 kg)   BMI 29.71 kg/m      Assessment & Plan:  Zacharie Portner comes in today with chief complaint of Medical Management of Chronic Issues   Diagnosis and orders addressed:  1. Essential hypertension Low sodium diet - hydrochlorothiazide (HYDRODIURIL) 25 MG tablet; Take 1 tablet (25 mg total) by mouth daily.  Dispense: 90 tablet; Refill: 1 - losartan (COZAAR) 100 MG tablet; Take 1 tablet (100 mg total) by mouth daily.  Dispense: 90 tablet; Refill: 1 - CMP14+EGFR  2. Smoker Smoking cessation  encouraged  3. Mixed hyperlipidemia Low fat diet - atorvastatin (LIPITOR) 40 MG tablet; Take 1 tablet (40 mg total) by mouth daily.  Dispense: 90 tablet; Refill: 1 - Lipid panel  4. BMI 28.0-28.9,adult Discussed diet and exercise for person with BMI >25 Will recheck weight in 3-6 months   5. Major depressive disorder with single episode, in full remission [facility]) Stress management   Labs pending Health Maintenance reviewed Diet and exercise encouraged  Follow up plan: 3 month   [place], FNP

## 2018-06-07 ENCOUNTER — Telehealth: Payer: Self-pay | Admitting: Nurse Practitioner

## 2018-06-07 NOTE — Telephone Encounter (Signed)
lmtcb

## 2018-06-13 NOTE — Telephone Encounter (Signed)
Closing chart = no return call

## 2018-08-30 ENCOUNTER — Ambulatory Visit (INDEPENDENT_AMBULATORY_CARE_PROVIDER_SITE_OTHER): Payer: Medicare Other | Admitting: Nurse Practitioner

## 2018-08-30 ENCOUNTER — Encounter: Payer: Self-pay | Admitting: Nurse Practitioner

## 2018-08-30 VITALS — BP 124/64 | HR 69 | Temp 96.9°F | Ht 71.0 in | Wt 219.0 lb

## 2018-08-30 DIAGNOSIS — I10 Essential (primary) hypertension: Secondary | ICD-10-CM

## 2018-08-30 DIAGNOSIS — F17200 Nicotine dependence, unspecified, uncomplicated: Secondary | ICD-10-CM | POA: Diagnosis not present

## 2018-08-30 DIAGNOSIS — F325 Major depressive disorder, single episode, in full remission: Secondary | ICD-10-CM | POA: Diagnosis not present

## 2018-08-30 DIAGNOSIS — Z6828 Body mass index (BMI) 28.0-28.9, adult: Secondary | ICD-10-CM

## 2018-08-30 DIAGNOSIS — E782 Mixed hyperlipidemia: Secondary | ICD-10-CM | POA: Diagnosis not present

## 2018-08-30 LAB — CMP14+EGFR
ALT: 11 IU/L (ref 0–44)
AST: 12 IU/L (ref 0–40)
Albumin/Globulin Ratio: 2 (ref 1.2–2.2)
Albumin: 3.8 g/dL (ref 3.7–4.7)
Alkaline Phosphatase: 97 IU/L (ref 39–117)
BILIRUBIN TOTAL: 0.4 mg/dL (ref 0.0–1.2)
BUN/Creatinine Ratio: 11 (ref 10–24)
BUN: 19 mg/dL (ref 8–27)
CHLORIDE: 99 mmol/L (ref 96–106)
CO2: 26 mmol/L (ref 20–29)
Calcium: 9.2 mg/dL (ref 8.6–10.2)
Creatinine, Ser: 1.69 mg/dL — ABNORMAL HIGH (ref 0.76–1.27)
GFR calc non Af Amer: 40 mL/min/{1.73_m2} — ABNORMAL LOW (ref >59)
GFR, EST AFRICAN AMERICAN: 46 mL/min/{1.73_m2} — AB (ref >59)
GLUCOSE: 138 mg/dL — AB (ref 65–99)
Globulin, Total: 1.9 g/dL (ref 1.5–4.5)
Potassium: 4 mmol/L (ref 3.5–5.2)
Sodium: 141 mmol/L (ref 134–144)
TOTAL PROTEIN: 5.7 g/dL — AB (ref 6.0–8.5)

## 2018-08-30 LAB — LIPID PANEL
Chol/HDL Ratio: 3.1 ratio (ref 0.0–5.0)
Cholesterol, Total: 123 mg/dL (ref 100–199)
HDL: 40 mg/dL (ref >39)
LDL Calculated: 64 mg/dL (ref 0–99)
Triglycerides: 97 mg/dL (ref 0–149)
VLDL CHOLESTEROL CAL: 19 mg/dL (ref 5–40)

## 2018-08-30 MED ORDER — LOSARTAN POTASSIUM 100 MG PO TABS: 100.0000 mg | ORAL_TABLET | Freq: Every day | ORAL | 1 refills | Status: DC

## 2018-08-30 MED ORDER — ATORVASTATIN CALCIUM 40 MG PO TABS: 40.0000 mg | ORAL_TABLET | Freq: Every day | ORAL | 1 refills | Status: DC

## 2018-08-30 MED ORDER — HYDROCHLOROTHIAZIDE 25 MG PO TABS: 25.0000 mg | ORAL_TABLET | Freq: Every day | ORAL | 1 refills | Status: DC

## 2018-08-30 NOTE — Patient Instructions (Signed)

## 2018-08-30 NOTE — Progress Notes (Signed)
Subjective:    Patient ID: Dean Mcconnell, male    DOB: [birth year], 73 y.o.   MRN: [id number]   Chief Complaint: Medical Management of Chronic Issues   HPI:  1. Essential hypertension  No c/o chest pain, sob or headache. Does not check blood pressure at home. BP Readings from Last 3 Encounters:  08/30/18 124/64  05/30/18 126/77  04/08/18 130/70     2. Mixed hyperlipidemia  Tries to avoid fried foods. Does no exercise.  3. Major depressive disorder with single episode, in full remission ([place])  Is on nothing right now. Says he is doing better. Depression screen [facility] 2/9 08/30/2018 05/30/2018 04/08/2018  Decreased Interest 0 0 0  Down, Depressed, Hopeless 0 0 0  PHQ - 2 Score 0 0 0  Altered sleeping - - -  Tired, decreased energy - - -  Change in appetite - - -  Feeling bad or failure about yourself  - - -  Trouble concentrating - - -  Moving slowly or fidgety/restless - - -  Suicidal thoughts - - -  PHQ-9 Score - - -  Difficult doing work/chores - - -     4. Smoker  Smoke over a pack a day.  5. BMI 28.0-28.9,adult  Weight is up 6lbs from last visit.    Outpatient Encounter Medications as of 08/30/2018  Medication Sig  . atorvastatin (LIPITOR) 40 MG tablet Take 1 tablet (40 mg total) by mouth daily.  . hydrochlorothiazide (HYDRODIURIL) 25 MG tablet Take 1 tablet (25 mg total) by mouth daily.  Marland Kitchen losartan (COZAAR) 100 MG tablet Take 1 tablet (100 mg total) by mouth daily.  . sildenafil (VIAGRA) 100 MG tablet 1 po prn      New complaints: None today  Social history: Lives alone. Has lots of male friends that he goes out with.  Review of Systems  Constitutional: Negative for activity change and appetite change.  HENT: Negative.   Eyes: Negative for pain.  Respiratory: Negative for shortness of breath.   Cardiovascular: Negative for chest pain, palpitations and leg swelling.  Gastrointestinal: Negative for abdominal pain.  Endocrine: Negative for polydipsia.    Genitourinary: Negative.   Skin: Negative for rash.  Neurological: Negative for dizziness, weakness and headaches.  Hematological: Does not bruise/bleed easily.  Psychiatric/Behavioral: Negative.   All other systems reviewed and are negative.      Objective:   Physical Exam Vitals signs and nursing note reviewed.  Constitutional:      Appearance: Normal appearance. He is well-developed.  HENT:     Head: Normocephalic.     Nose: Nose normal.  Eyes:     Pupils: Pupils are equal, round, and reactive to light.  Neck:     Musculoskeletal: Normal range of motion and neck supple.     Thyroid: No thyroid mass or thyromegaly.     Vascular: No carotid bruit or JVD.     Trachea: Phonation normal.  Cardiovascular:     Rate and Rhythm: Normal rate and regular rhythm.  Pulmonary:     Effort: Pulmonary effort is normal. No respiratory distress.     Breath sounds: Normal breath sounds.  Abdominal:     General: Bowel sounds are normal.     Palpations: Abdomen is soft.     Tenderness: There is no abdominal tenderness.  Musculoskeletal: Normal range of motion.  Lymphadenopathy:     Cervical: No cervical adenopathy.  Skin:    General: Skin is warm and dry.  Neurological:  Mental Status: He is alert and oriented to person, place, and time.  Psychiatric:        Behavior: Behavior normal.        Thought Content: Thought content normal.        Judgment: Judgment normal.    BP 124/64   Pulse 69   Temp (!) 96.9 F (36.1 C) (Oral)   Ht 5\' 11"  (1.803 m)   Wt 219 lb (99.3 kg)   BMI 30.54 kg/m        Assessment & Plan:  Dean Mcconnell comes in today with chief complaint of Medical Management of Chronic Issues   Diagnosis and orders addressed:  1. Essential hypertension Low sodium diet - hydrochlorothiazide (HYDRODIURIL) 25 MG tablet; Take 1 tablet (25 mg total) by mouth daily.  Dispense: 90 tablet; Refill: 1 - losartan (COZAAR) 100 MG tablet; Take 1 tablet (100 mg total) by  mouth daily.  Dispense: 90 tablet; Refill: 1  2. Mixed hyperlipidemia Low fat diet - atorvastatin (LIPITOR) 40 MG tablet; Take 1 tablet (40 mg total) by mouth daily.  Dispense: 90 tablet; Refill: 1  3. Major depressive disorder with single episode, in full remission ([place]) Stress amangement  4. Smoker Smoking cessation encouraged  5. BMI 28.0-28.9,adult Discussed diet and exercise for person with BMI >25 Will recheck weight in 3-6 months    Labs pending Health Maintenance reviewed Diet and exercise encouraged  Follow up plan: 3 months   Mary-Margaret Hassell Done, FNP

## 2018-08-30 NOTE — Addendum Note (Signed)
Addended by: Chevis Pretty on: 08/30/2018 10:49 AM   Modules accepted: Orders

## 2018-12-05 ENCOUNTER — Other Ambulatory Visit: Payer: Self-pay

## 2018-12-05 ENCOUNTER — Encounter: Payer: Self-pay | Admitting: Nurse Practitioner

## 2018-12-05 ENCOUNTER — Telehealth: Payer: Self-pay | Admitting: Nurse Practitioner

## 2018-12-05 ENCOUNTER — Ambulatory Visit (INDEPENDENT_AMBULATORY_CARE_PROVIDER_SITE_OTHER): Payer: Medicare Other | Admitting: Nurse Practitioner

## 2018-12-05 DIAGNOSIS — E782 Mixed hyperlipidemia: Secondary | ICD-10-CM | POA: Diagnosis not present

## 2018-12-05 DIAGNOSIS — L989 Disorder of the skin and subcutaneous tissue, unspecified: Secondary | ICD-10-CM

## 2018-12-05 DIAGNOSIS — F325 Major depressive disorder, single episode, in full remission: Secondary | ICD-10-CM

## 2018-12-05 DIAGNOSIS — F17200 Nicotine dependence, unspecified, uncomplicated: Secondary | ICD-10-CM

## 2018-12-05 DIAGNOSIS — Z6828 Body mass index (BMI) 28.0-28.9, adult: Secondary | ICD-10-CM

## 2018-12-05 DIAGNOSIS — I10 Essential (primary) hypertension: Secondary | ICD-10-CM

## 2018-12-05 MED ORDER — LOSARTAN POTASSIUM 100 MG PO TABS: 100.0000 mg | ORAL_TABLET | Freq: Every day | ORAL | 1 refills | Status: DC

## 2018-12-05 MED ORDER — HYDROCHLOROTHIAZIDE 25 MG PO TABS: 25.0000 mg | ORAL_TABLET | Freq: Every day | ORAL | 1 refills | Status: DC

## 2018-12-05 MED ORDER — ATORVASTATIN CALCIUM 40 MG PO TABS: 40.0000 mg | ORAL_TABLET | Freq: Every day | ORAL | 1 refills | Status: DC

## 2018-12-05 NOTE — Progress Notes (Signed)
Virtual Visit via telephone Note  I connected with Dean Mcconnell on 12/05/18 at 9:50 AM by telephone and verified that I am speaking with the correct person using two identifiers. Dean Mcconnell is currently located at home and no one is currently with her during visit. The provider, Mary-Margaret Hassell Done, FNP is located in their office at time of visit.  I discussed the limitations, risks, security and privacy concerns of performing an evaluation and management service by telephone and the availability of in person appointments. I also discussed with the patient that there may be a patient responsible charge related to this service. The patient expressed understanding and agreed to proceed.   History and Present Illness:   Chief Complaint: Medical Management of Chronic Issues    HPI:  1. Essential hypertension No c/o chest pain, sob or headache. Does not check his blood pressure at home. BP Readings from Last 3 Encounters:  08/30/18 124/64  05/30/18 126/77  04/08/18 130/70     2. Mixed hyperlipidemia He does try to watch his diet and stays very active  3. Major depressive disorder with single episode, in full remission [facility]) denies any depression right now.  4. Smoker Still smoking over a pack a day  5. BMI 28.0-28.9,adult Discussed diet and exercise for person with BMI >25 Will recheck weight in 3-6 months     Outpatient Encounter Medications as of 12/05/2018  Medication Sig  . atorvastatin (LIPITOR) 40 MG tablet Take 1 tablet (40 mg total) by mouth daily.  . hydrochlorothiazide (HYDRODIURIL) 25 MG tablet Take 1 tablet (25 mg total) by mouth daily.  Marland Kitchen losartan (COZAAR) 100 MG tablet Take 1 tablet (100 mg total) by mouth daily.  . sildenafil (VIAGRA) 100 MG tablet 1 po prn      New complaints: None today   Social history: Lives by hisself and has only been out to go the grocery store.     Review of Systems  Constitutional: Negative for diaphoresis and weight  loss.  Eyes: Negative for blurred vision, double vision and pain.  Respiratory: Negative for shortness of breath.   Cardiovascular: Negative for chest pain, palpitations, orthopnea and leg swelling.  Gastrointestinal: Negative for abdominal pain.  Skin: Negative for rash.  Neurological: Negative for dizziness, sensory change, loss of consciousness, weakness and headaches.  Endo/Heme/Allergies: Negative for polydipsia. Does not bruise/bleed easily.  Psychiatric/Behavioral: Negative for memory loss. The patient does not have insomnia.   All other systems reviewed and are negative.    Observations/Objective: Alert and oriented- answers all questions appropriately No distress noted  Assessment and Plan: Dean Mcconnell comes in today with chief complaint of Medical Management of Chronic Issues   Diagnosis and orders addressed:  1. Essential hypertension Low sodium diet - hydrochlorothiazide (HYDRODIURIL) 25 MG tablet; Take 1 tablet (25 mg total) by mouth daily.  Dispense: 90 tablet; Refill: 1 - losartan (COZAAR) 100 MG tablet; Take 1 tablet (100 mg total) by mouth daily.  Dispense: 90 tablet; Refill: 1  2. Mixed hyperlipidemia Low fat diet and exercise - atorvastatin (LIPITOR) 40 MG tablet; Take 1 tablet (40 mg total) by mouth daily.  Dispense: 90 tablet; Refill: 1  3. Major depressive disorder with single episode, in full remission [facility]) Stress management  4. Smoker Decrease smoking- refuses to quit right now  5. BMI 28.0-28.9,adult Discussed diet and exercise for person with BMI >25 Will recheck weight in 3-6 months   Labs pending Health Maintenance reviewed Diet and exercise encouraged  Follow  up plan: 3 months     I discussed the assessment and treatment plan with the patient. The patient was provided an opportunity to ask questions and all were answered. The patient agreed with the plan and demonstrated an understanding of the instructions.   The patient was  advised to call back or seek an in-person evaluation if the symptoms worsen or if the condition fails to improve as anticipated.  The above assessment and management plan was discussed with the patient. The patient verbalized understanding of and has agreed to the management plan. Patient is aware to call the clinic if symptoms persist or worsen. Patient is aware when to return to the clinic for a follow-up visit. Patient educated on when it is appropriate to go to the emergency department.   Time call ended:   10:15  I provided 20 minutes of non-face-to-face time during this encounter.    Mary-Margaret Hassell Done, FNP

## 2018-12-05 NOTE — Telephone Encounter (Signed)
Referral made 

## 2018-12-05 NOTE — Telephone Encounter (Signed)
Pt is wanting MMM to refer him back to dermatology in [place] for a spot on side of his face that needs taken off. He forgot to mention that to her in the televisit he had earlier

## 2018-12-05 NOTE — Telephone Encounter (Signed)
Left message stating referral has been placed.

## 2018-12-13 DIAGNOSIS — D485 Neoplasm of uncertain behavior of skin: Secondary | ICD-10-CM | POA: Diagnosis not present

## 2018-12-13 DIAGNOSIS — Z85828 Personal history of other malignant neoplasm of skin: Secondary | ICD-10-CM | POA: Diagnosis not present

## 2018-12-13 DIAGNOSIS — L720 Epidermal cyst: Secondary | ICD-10-CM | POA: Diagnosis not present

## 2019-03-01 ENCOUNTER — Encounter: Payer: Self-pay | Admitting: Family

## 2019-03-01 ENCOUNTER — Other Ambulatory Visit: Payer: Self-pay

## 2019-03-01 ENCOUNTER — Ambulatory Visit (INDEPENDENT_AMBULATORY_CARE_PROVIDER_SITE_OTHER): Payer: Medicare Other | Admitting: Family

## 2019-03-01 DIAGNOSIS — J301 Allergic rhinitis due to pollen: Secondary | ICD-10-CM | POA: Diagnosis not present

## 2019-03-01 MED ORDER — CETIRIZINE HCL 10 MG PO TABS: 10.0000 mg | ORAL_TABLET | Freq: Every day | ORAL | 11 refills | Status: DC

## 2019-03-01 MED ORDER — FLUTICASONE PROPIONATE 50 MCG/ACT NA SUSP: 2.0000 | Freq: Every day | NASAL | 6 refills | Status: DC

## 2019-03-01 NOTE — Progress Notes (Signed)
   Virtual Visit via telephone Note  Attempted to call patient at 4:40 pm, no answer,  VM left   Due to COVID-19 pandemic this visit was conducted virtually. This visit type was conducted due to national recommendations for restrictions regarding the COVID-19 Pandemic (e.g. social distancing, sheltering in place) in an effort to limit this patient's exposure and mitigate transmission in our community. All issues noted in this document were discussed and addressed.  A physical exam was not performed with this format.  I connected with Dean Mcconnell on 03/01/19 at 4:55 pm pm by telephone and verified that I am speaking with the correct person using two identifiers. Dean Mcconnell is currently located at home and no one is currently with her during visit. The provider, Evelina Dun, FNP is located in their office at time of visit.  I discussed the limitations, risks, security and privacy concerns of performing an evaluation and management service by telephone and the availability of in person appointments. I also discussed with the patient that there may be a patient responsible charge related to this service. The patient expressed understanding and agreed to proceed.   History and Present Illness:  Sinusitis This is a new problem. The current episode started 1 to 4 weeks ago. The problem has been gradually worsening since onset. There has been no fever. His pain is at a severity of 1/10. The pain is mild. Associated symptoms include congestion, headaches, sinus pressure and sneezing. Pertinent negatives include no coughing, ear pain, shortness of breath or sore throat. Past treatments include oral decongestants. The treatment provided mild relief.      Review of Systems  HENT: Positive for congestion, sinus pressure and sneezing. Negative for ear pain and sore throat.   Respiratory: Negative for cough and shortness of breath.   Neurological: Positive for headaches.  All other systems reviewed  and are negative.    Observations/Objective: No SOB or distress noted   Assessment and Plan: 1. Allergic rhinitis due to pollen, unspecified seasonality Avoid allergens Discussed COVID testing, he does not wish to go at this time. He states he only gets out of the house once a month and his symptoms do not start until he sat under his AC RTO if symptoms worsen or do not improve  - cetirizine (ZYRTEC) 10 MG tablet; Take 1 tablet (10 mg total) by mouth daily.  Dispense: 30 tablet; Refill: 11 - fluticasone (FLONASE) 50 MCG/ACT nasal spray; Place 2 sprays into both nostrils daily.  Dispense: 16 g; Refill: 6    I discussed the assessment and treatment plan with the patient. The patient was provided an opportunity to ask questions and all were answered. The patient agreed with the plan and demonstrated an understanding of the instructions.   The patient was advised to call back or seek an in-person evaluation if the symptoms worsen or if the condition fails to improve as anticipated.  The above assessment and management plan was discussed with the patient. The patient verbalized understanding of and has agreed to the management plan. Patient is aware to call the clinic if symptoms persist or worsen. Patient is aware when to return to the clinic for a follow-up visit. Patient educated on when it is appropriate to go to the emergency department.   Time call ended:  5:06 pm   I provided 11 minutes of non-face-to-face time during this encounter.    Evelina Dun, FNP

## 2019-03-08 ENCOUNTER — Other Ambulatory Visit: Payer: Self-pay

## 2019-03-09 ENCOUNTER — Ambulatory Visit (INDEPENDENT_AMBULATORY_CARE_PROVIDER_SITE_OTHER): Payer: Medicare Other | Admitting: Nurse Practitioner

## 2019-03-09 ENCOUNTER — Encounter: Payer: Self-pay | Admitting: Nurse Practitioner

## 2019-03-09 VITALS — BP 140/76 | HR 76 | Temp 96.8°F | Ht 71.0 in | Wt 218.0 lb

## 2019-03-09 DIAGNOSIS — E782 Mixed hyperlipidemia: Secondary | ICD-10-CM

## 2019-03-09 DIAGNOSIS — I10 Essential (primary) hypertension: Secondary | ICD-10-CM

## 2019-03-09 DIAGNOSIS — F325 Major depressive disorder, single episode, in full remission: Secondary | ICD-10-CM | POA: Diagnosis not present

## 2019-03-09 DIAGNOSIS — F17200 Nicotine dependence, unspecified, uncomplicated: Secondary | ICD-10-CM

## 2019-03-09 DIAGNOSIS — Z6828 Body mass index (BMI) 28.0-28.9, adult: Secondary | ICD-10-CM

## 2019-03-09 MED ORDER — HYDROCHLOROTHIAZIDE 25 MG PO TABS: 25.0000 mg | ORAL_TABLET | Freq: Every day | ORAL | 1 refills | Status: DC

## 2019-03-09 MED ORDER — ATORVASTATIN CALCIUM 40 MG PO TABS: 40.0000 mg | ORAL_TABLET | Freq: Every day | ORAL | 1 refills | Status: DC

## 2019-03-09 MED ORDER — LOSARTAN POTASSIUM 100 MG PO TABS: 100.0000 mg | ORAL_TABLET | Freq: Every day | ORAL | 1 refills | Status: DC

## 2019-03-09 NOTE — Progress Notes (Signed)
Subjective:    Patient ID: Dean Mcconnell, male    DOB: [birth year], 73 y.o.   MRN: [id number]   Chief Complaint: Medical Management of Chronic Issues    HPI:  1. Essential hypertension No c/o chest pain, sob or headache. Does not check blood pressure at home. BP Readings from Last 3 Encounters:  03/09/19 140/76  08/30/18 124/64  05/30/18 126/77     2. Mixed hyperlipidemia Does not watch diet and does no exercise. He says he does stay active though.  3. Major depressive disorder with single episode, in full remission ([place]) Is currently on no antidepressant and is ding well. Depression screen [facility] 2/9 03/09/2019 12/05/2018 08/30/2018  Decreased Interest 0 0 0  Down, Depressed, Hopeless 0 0 0  PHQ - 2 Score 0 0 0  Altered sleeping - - -  Tired, decreased energy - - -  Change in appetite - - -  Feeling bad or failure about yourself  - - -  Trouble concentrating - - -  Moving slowly or fidgety/restless - - -  Suicidal thoughts - - -  PHQ-9 Score - - -  Difficult doing work/chores - - -  Some recent data might be hidden     4. Smoker Smokes over a pack a day. He does not want to quit  5. BMI 28.0-28.9,adult No recent weight changes    Outpatient Encounter Medications as of 03/09/2019  Medication Sig  . atorvastatin (LIPITOR) 40 MG tablet Take 1 tablet (40 mg total) by mouth daily.  . cetirizine (ZYRTEC) 10 MG tablet Take 1 tablet (10 mg total) by mouth daily.  . fluticasone (FLONASE) 50 MCG/ACT nasal spray Place 2 sprays into both nostrils daily.  . hydrochlorothiazide (HYDRODIURIL) 25 MG tablet Take 1 tablet (25 mg total) by mouth daily.  Marland Kitchen losartan (COZAAR) 100 MG tablet Take 1 tablet (100 mg total) by mouth daily.  . sildenafil (VIAGRA) 100 MG tablet 1 po prn    Past Surgical History:  Procedure Laterality Date  . SKIN CANCER EXCISION     nose    Family History  Problem Relation Age of Onset  . Hypertension Father     New complaints: None today  Social  history: Lives alone  Controlled substance contract: N/A    Review of Systems  Constitutional: Negative for activity change and appetite change.  HENT: Negative.   Eyes: Negative for pain.  Respiratory: Negative for shortness of breath.   Cardiovascular: Negative for chest pain, palpitations and leg swelling.  Gastrointestinal: Negative for abdominal pain.  Endocrine: Negative for polydipsia.  Genitourinary: Negative.   Skin: Negative for rash.  Neurological: Negative for dizziness, weakness and headaches.  Hematological: Does not bruise/bleed easily.  Psychiatric/Behavioral: Negative.   All other systems reviewed and are negative.      Objective:   Physical Exam Vitals signs and nursing note reviewed.  Constitutional:      Appearance: Normal appearance. He is well-developed.  HENT:     Head: Normocephalic.     Nose: Nose normal.  Eyes:     Pupils: Pupils are equal, round, and reactive to light.  Neck:     Musculoskeletal: Normal range of motion and neck supple.     Thyroid: No thyroid mass or thyromegaly.     Vascular: No carotid bruit or JVD.     Trachea: Phonation normal.  Cardiovascular:     Rate and Rhythm: Normal rate and regular rhythm.  Pulmonary:     Effort: Pulmonary  effort is normal. No respiratory distress.     Breath sounds: Normal breath sounds.  Abdominal:     General: Bowel sounds are normal.     Palpations: Abdomen is soft.     Tenderness: There is no abdominal tenderness.  Musculoskeletal: Normal range of motion.  Lymphadenopathy:     Cervical: No cervical adenopathy.  Skin:    General: Skin is warm and dry.  Neurological:     Mental Status: He is alert and oriented to person, place, and time.  Psychiatric:        Behavior: Behavior normal.        Thought Content: Thought content normal.        Judgment: Judgment normal.    BP 140/76   Pulse 76   Temp (!) 96.8 F (36 C) (Oral)   Ht 5\' 11"  (1.803 m)   Wt 218 lb (98.9 kg)   BMI 30.40  kg/m         Assessment & Plan:  Saw [place] comes in today with chief complaint of Medical Management of Chronic Issues   Diagnosis and orders addressed:  1. Essential hypertension Low sodium diet - hydrochlorothiazide (HYDRODIURIL) 25 MG tablet; Take 1 tablet (25 mg total) by mouth daily.  Dispense: 90 tablet; Refill: 1 - losartan (COZAAR) 100 MG tablet; Take 1 tablet (100 mg total) by mouth daily.  Dispense: 90 tablet; Refill: 1  2. Mixed hyperlipidemia Low fat diet - atorvastatin (LIPITOR) 40 MG tablet; Take 1 tablet (40 mg total) by mouth daily.  Dispense: 90 tablet; Refill: 1  3. Major depressive disorder with single episode, in full remission ([place]) Continue stress management  4. Smoker At least cut bak to 1/2 pack a day for now.  5. BMI 28.0-28.9,adult Discussed diet and exercise for person with BMI >25 Will recheck weight in 3-6 months   Labs pending Health Maintenance reviewed Diet and exercise encouraged  Follow up plan: 3 months   Mary-Margaret Hassell Done, FNP

## 2019-03-09 NOTE — Patient Instructions (Signed)
Steps to Quit Smoking Smoking tobacco is the leading cause of preventable death. It can affect almost every organ in the body. Smoking puts you and people around you at risk for many serious, long-lasting (chronic) diseases. Quitting smoking can be hard, but it is one of the best things that you can do for your health. It is never too late to quit. How do I get ready to quit? When you decide to quit smoking, make a plan to help you succeed. Before you quit:  Pick a date to quit. Set a date within the next 2 weeks to give you time to prepare.  Write down the reasons why you are quitting. Keep this list in places where you will see it often.  Tell your family, friends, and co-workers that you are quitting. Their support is important.  Talk with your doctor about the choices that may help you quit.  Find out if your health insurance will pay for these treatments.  Know the people, places, things, and activities that make you want to smoke (triggers). Avoid them. What first steps can I take to quit smoking?  Throw away all cigarettes at home, at work, and in your car.  Throw away the things that you use when you smoke, such as ashtrays and lighters.  Clean your car. Make sure to empty the ashtray.  Clean your home, including curtains and carpets. What can I do to help me quit smoking? Talk with your doctor about taking medicines and seeing a counselor at the same time. You are more likely to succeed when you do both.  If you are pregnant or breastfeeding, talk with your doctor about counseling or other ways to quit smoking. Do not take medicine to help you quit smoking unless your doctor tells you to do so. To quit smoking: Quit right away  Quit smoking totally, instead of slowly cutting back on how much you smoke over a period of time.  Go to counseling. You are more likely to quit if you go to counseling sessions regularly. Take medicine You may take medicines to help you quit. Some  medicines need a prescription, and some you can buy over-the-counter. Some medicines may contain a drug called nicotine to replace the nicotine in cigarettes. Medicines may:  Help you to stop having the desire to smoke (cravings).  Help to stop the problems that come when you stop smoking (withdrawal symptoms). Your doctor may ask you to use:  Nicotine patches, gum, or lozenges.  Nicotine inhalers or sprays.  Non-nicotine medicine that is taken by mouth. Find resources Find resources and other ways to help you quit smoking and remain smoke-free after you quit. These resources are most helpful when you use them often. They include:  Online chats with a counselor.  Phone quitlines.  Printed self-help materials.  Support groups or group counseling.  Text messaging programs.  Mobile phone apps. Use apps on your mobile phone or tablet that can help you stick to your quit plan. There are many free apps for mobile phones and tablets as well as websites. Examples include Quit Guide from the CDC and smokefree.gov  What things can I do to make it easier to quit?   Talk to your family and friends. Ask them to support and encourage you.  Call a phone quitline (1-800-QUIT-NOW), reach out to support groups, or work with a counselor.  Ask people who smoke to not smoke around you.  Avoid places that make you want to smoke,   such as: ? Bars. ? Parties. ? Smoke-break areas at work.  Spend time with people who do not smoke.  Lower the stress in your life. Stress can make you want to smoke. Try these things to help your stress: ? Getting regular exercise. ? Doing deep-breathing exercises. ? Doing yoga. ? Meditating. ? Doing a body scan. To do this, close your eyes, focus on one area of your body at a time from head to toe. Notice which parts of your body are tense. Try to relax the muscles in those areas. How will I feel when I quit smoking? Day 1 to 3 weeks Within the first 24 hours,  you may start to have some problems that come from quitting tobacco. These problems are very bad 2-3 days after you quit, but they do not often last for more than 2-3 weeks. You may get these symptoms:  Mood swings.  Feeling restless, nervous, angry, or annoyed.  Trouble concentrating.  Dizziness.  Strong desire for high-sugar foods and nicotine.  Weight gain.  Trouble pooping (constipation).  Feeling like you may vomit (nausea).  Coughing or a sore throat.  Changes in how the medicines that you take for other issues work in your body.  Depression.  Trouble sleeping (insomnia). Week 3 and afterward After the first 2-3 weeks of quitting, you may start to notice more positive results, such as:  Better sense of smell and taste.  Less coughing and sore throat.  Slower heart rate.  Lower blood pressure.  Clearer skin.  Better breathing.  Fewer sick days. Quitting smoking can be hard. Do not give up if you fail the first time. Some people need to try a few times before they succeed. Do your best to stick to your quit plan, and talk with your doctor if you have any questions or concerns. Summary  Smoking tobacco is the leading cause of preventable death. Quitting smoking can be hard, but it is one of the best things that you can do for your health.  When you decide to quit smoking, make a plan to help you succeed.  Quit smoking right away, not slowly over a period of time.  When you start quitting, seek help from your doctor, family, or friends. This information is not intended to replace advice given to you by your health care provider. Make sure you discuss any questions you have with your health care provider. Document Released: 05/16/2009 Document Revised: 10/07/2018 Document Reviewed: 10/08/2018 Elsevier Patient Education  2020 [employer]  

## 2019-03-09 NOTE — Addendum Note (Signed)
Addended by: Chevis Pretty on: 03/09/2019 10:50 AM   Modules accepted: Orders

## 2019-03-10 LAB — CMP14+EGFR
ALT: 9 IU/L (ref 0–44)
AST: 11 IU/L (ref 0–40)
Albumin/Globulin Ratio: 1.9 (ref 1.2–2.2)
Albumin: 4 g/dL (ref 3.7–4.7)
Alkaline Phosphatase: 97 IU/L (ref 39–117)
BUN/Creatinine Ratio: 16 (ref 10–24)
BUN: 28 mg/dL — ABNORMAL HIGH (ref 8–27)
Bilirubin Total: 0.4 mg/dL (ref 0.0–1.2)
CO2: 25 mmol/L (ref 20–29)
Calcium: 9.7 mg/dL (ref 8.6–10.2)
Chloride: 103 mmol/L (ref 96–106)
Creatinine, Ser: 1.75 mg/dL — ABNORMAL HIGH (ref 0.76–1.27)
GFR calc Af Amer: 44 mL/min/{1.73_m2} — ABNORMAL LOW (ref >59)
GFR calc non Af Amer: 38 mL/min/{1.73_m2} — ABNORMAL LOW (ref >59)
Globulin, Total: 2.1 g/dL (ref 1.5–4.5)
Glucose: 91 mg/dL (ref 65–99)
Potassium: 3.7 mmol/L (ref 3.5–5.2)
Sodium: 143 mmol/L (ref 134–144)
Total Protein: 6.1 g/dL (ref 6.0–8.5)

## 2019-03-10 LAB — LIPID PANEL
Chol/HDL Ratio: 2.9 ratio (ref 0.0–5.0)
Cholesterol, Total: 112 mg/dL (ref 100–199)
HDL: 38 mg/dL — ABNORMAL LOW (ref >39)
LDL Calculated: 60 mg/dL (ref 0–99)
Triglycerides: 68 mg/dL (ref 0–149)
VLDL Cholesterol Cal: 14 mg/dL (ref 5–40)

## 2019-04-05 ENCOUNTER — Telehealth: Payer: Self-pay | Admitting: Nurse Practitioner

## 2019-04-12 NOTE — Telephone Encounter (Signed)
Multiple attempts made to contact patient.  This encounter will now be closed  

## 2019-04-14 ENCOUNTER — Ambulatory Visit (INDEPENDENT_AMBULATORY_CARE_PROVIDER_SITE_OTHER): Payer: Medicare Other | Admitting: *Deleted

## 2019-04-14 DIAGNOSIS — Z0000 Encounter for general adult medical examination without abnormal findings: Secondary | ICD-10-CM

## 2019-04-14 NOTE — Patient Instructions (Signed)
Preventive Care 75 Years and Older, Male Preventive care refers to lifestyle choices and visits with your health care provider that can promote health and wellness. This includes:  A yearly physical exam. This is also called an annual well check.  Regular dental and eye exams.  Immunizations.  Screening for certain conditions.  Healthy lifestyle choices, such as diet and exercise. What can I expect for my preventive care visit? Physical exam Your health care provider will check:  Height and weight. These may be used to calculate body mass index (BMI), which is a measurement that tells if you are at a healthy weight.  Heart rate and blood pressure.  Your skin for abnormal spots. Counseling Your health care provider may ask you questions about:  Alcohol, tobacco, and drug use.  Emotional well-being.  Home and relationship well-being.  Sexual activity.  Eating habits.  History of falls.  Memory and ability to understand (cognition).  Work and work Statistician. What immunizations do I need?  Influenza (flu) vaccine  This is recommended every year. Tetanus, diphtheria, and pertussis (Tdap) vaccine  You may need a Td booster every 10 years. Varicella (chickenpox) vaccine  You may need this vaccine if you have not already been vaccinated. Zoster (shingles) vaccine  You may need this after age 50. Pneumococcal conjugate (PCV13) vaccine  One dose is recommended after age 24. Pneumococcal polysaccharide (PPSV23) vaccine  One dose is recommended after age 33. Measles, mumps, and rubella (MMR) vaccine  You may need at least one dose of MMR if you were born in 1957 or later. You may also need a second dose. Meningococcal conjugate (MenACWY) vaccine  You may need this if you have certain conditions. Hepatitis A vaccine  You may need this if you have certain conditions or if you travel or work in places where you may be exposed to hepatitis A. Hepatitis B vaccine   You may need this if you have certain conditions or if you travel or work in places where you may be exposed to hepatitis B. Haemophilus influenzae type b (Hib) vaccine  You may need this if you have certain conditions. You may receive vaccines as individual doses or as more than one vaccine together in one shot (combination vaccines). Talk with your health care provider about the risks and benefits of combination vaccines. What tests do I need? Blood tests  Lipid and cholesterol levels. These may be checked every 5 years, or more frequently depending on your overall health.  Hepatitis C test.  Hepatitis B test. Screening  Lung cancer screening. You may have this screening every year starting at age 74 if you have a 30-pack-year history of smoking and currently smoke or have quit within the past 15 years.  Colorectal cancer screening. All adults should have this screening starting at age 57 and continuing until age 54. Your health care provider may recommend screening at age 47 if you are at increased risk. You will have tests every 1-10 years, depending on your results and the type of screening test.  Prostate cancer screening. Recommendations will vary depending on your family history and other risks.  Diabetes screening. This is done by checking your blood sugar (glucose) after you have not eaten for a while (fasting). You may have this done every 1-3 years.  Abdominal aortic aneurysm (AAA) screening. You may need this if you are a current or former smoker.  Sexually transmitted disease (STD) testing. Follow these instructions at home: Eating and drinking  Eat  a diet that includes fresh fruits and vegetables, whole grains, lean protein, and low-fat dairy products. Limit your intake of foods with high amounts of sugar, saturated fats, and salt.  Take vitamin and mineral supplements as recommended by your health care provider.  Do not drink alcohol if your health care provider  tells you not to drink.  If you drink alcohol: ? Limit how much you have to 0-2 drinks a day. ? Be aware of how much alcohol is in your drink. In the [place] one drink equals one 12 oz bottle of beer (355 mL), one 5 oz glass of wine (148 mL), or one 1 oz glass of hard liquor (44 mL). Lifestyle  Take daily care of your teeth and gums.  Stay active. Exercise for at least 30 minutes on 5 or more days each week.  Do not use any products that contain nicotine or tobacco, such as cigarettes, e-cigarettes, and chewing tobacco. If you need help quitting, ask your health care provider.  If you are sexually active, practice safe sex. Use a condom or other form of protection to prevent STIs (sexually transmitted infections).  Talk with your health care provider about taking a low-dose aspirin or statin. What's next?  Visit your health care provider once a year for a well check visit.  Ask your health care provider how often you should have your eyes and teeth checked.  Stay up to date on all vaccines. This information is not intended to replace advice given to you by your health care provider. Make sure you discuss any questions you have with your health care provider. Document Released: 08/16/2015 Document Revised: 07/14/2018 Document Reviewed: 07/14/2018 Elsevier Patient Education  2020 Elsevier Inc.  

## 2019-04-14 NOTE — Progress Notes (Addendum)
MEDICARE ANNUAL WELLNESS VISIT  04/14/2019  Telephone Visit Disclaimer This Medicare AWV was conducted by telephone due to national recommendations for restrictions regarding the COVID-19 Pandemic (e.g. social distancing).  I verified, using two identifiers, that I am speaking with Dean Mcconnell or their authorized healthcare agent. I discussed the limitations, risks, security, and privacy concerns of performing an evaluation and management service by telephone and the potential availability of an in-person appointment in the future. The patient expressed understanding and agreed to proceed.   Subjective:  Dean Mcconnell is a 73 y.o. male patient of Dean Mcconnell, [place] who had a Medicare Annual Wellness Visit today via telephone. Dean Mcconnell is Retired and lives alone. he has 1 child. he reports that he is socially active and does interact with friends/family regularly. he is moderately physically active and enjoys hunting, fishing and playing golf.  Patient Care Team: Dean Pretty, FNP as PCP - General (Nurse Practitioner) Harlen Labs, MD as Referring Physician (Optometry)  Advanced Directives 04/14/2019 04/08/2018 01/20/2017 11/29/2014 09/10/2014 06/07/2014  Does Patient Have a Medical Advance Directive? Yes Yes No No No No  Type of Paramedic of [place];Living will [place];Living will - - - -  Does patient want to make changes to medical advance directive? No - Patient declined No - Patient declined Yes (MAU/Ambulatory/Procedural Areas - Information given) - - -  Copy of [place] in Chart? No - copy requested No - copy requested - - - -  Would patient like information on creating a medical advance directive? - - Yes (MAU/Ambulatory/Procedural Areas - Information given) (No Data) Yes - Scientist, clinical (histocompatibility and immunogenetics) given Yes - Educational materials given    Hospital Utilization Over the Past 12 Months: # of hospitalizations  or ER visits: 0 # of surgeries: 0  Review of Systems    Patient reports that his overall health is unchanged compared to last year.  History obtained from chart review  Patient Reported Readings (BP, Pulse, CBG, Weight, etc) none  Pain Assessment Pain : No/denies pain     Current Medications & Allergies (verified) Allergies as of 04/14/2019      Reactions   Penicillins Swelling      Medication List       Accurate as of April 14, 2019 10:25 AM. If you have any questions, ask your nurse or doctor.        atorvastatin 40 MG tablet Commonly known as: LIPITOR Take 1 tablet (40 mg total) by mouth daily.   cetirizine 10 MG tablet Commonly known as: ZYRTEC Take 1 tablet (10 mg total) by mouth daily.   fluticasone 50 MCG/ACT nasal spray Commonly known as: FLONASE Place 2 sprays into both nostrils daily.   hydrochlorothiazide 25 MG tablet Commonly known as: HYDRODIURIL Take 1 tablet (25 mg total) by mouth daily.   losartan 100 MG tablet Commonly known as: COZAAR Take 1 tablet (100 mg total) by mouth daily.   sildenafil 100 MG tablet Commonly known as: VIAGRA 1 po prn       History (reviewed): Past Medical History:  Diagnosis Date  . 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 Hyperlipidemia   . Hypertension    Past Surgical History:  Procedure Laterality Date  . SKIN CANCER EXCISION     nose   Family History  Problem Relation Age of Onset  . Hypertension Father    Social History   Socioeconomic History  . Marital  status: Widowed    Spouse name: Not on file  . Number of children: 1  . Years of education: GED  . Highest education level: GED or equivalent  Occupational History  . Occupation: Retired     Comment: Statistician  Social Needs  . Financial resource strain: Not hard at all  . Food insecurity    Worry: Never true    Inability: Never true  . Transportation needs    Medical: No    Non-medical: No  Tobacco Use  .  Smoking status: Light Tobacco Smoker    Packs/day: 0.25    Years: 30.00    Pack years: 7.50    Types: Cigarettes  . Smokeless tobacco: Former Systems developer    Types: Chew    Quit date: 04/13/2009  . Tobacco comment: Patient states that he no longer inhales  Substance and Sexual Activity  . Alcohol use: No  . Drug use: No  . Sexual activity: Not Currently  Lifestyle  . Physical activity    Days per week: 5 days    Minutes per session: 30 min  . Stress: Only a little  Relationships  . Social connections    Talks on phone: More than three times a week    Gets together: More than three times a week    Attends religious service: Never    Active member of club or organization: No    Attends meetings of clubs or organizations: Never    Relationship status: Widowed  Other Topics Concern  . Not on file  Social History Narrative  . Not on file    Activities of Daily Living In your present state of health, do you have any difficulty performing the following activities: 04/14/2019  Hearing? N  Vision? N  Comment wears glasses to read small print  Difficulty concentrating or making decisions? N  Walking or climbing stairs? N  Dressing or bathing? N  Doing errands, shopping? N  Preparing Food and eating ? N  Using the Toilet? N  In the past six months, have you accidently leaked urine? N  Do you have problems with loss of bowel control? N  Managing your Medications? N  Managing your Finances? N  Housekeeping or managing your Housekeeping? N  Some recent data might be hidden    Patient Education/ Literacy How often do you need to have someone help you when you read instructions, pamphlets, or other written materials from your doctor or pharmacy?: 1 - Never What is the last grade level you completed in school?: GED  Exercise Current Exercise Habits: Home exercise routine, Type of exercise: Other - see comments(push mowing his yard and cleaning and cooking), Time (Minutes): 30, Frequency  (Times/Week): 5, Weekly Exercise (Minutes/Week): 150, Intensity: Mild, Exercise limited by: None identified  Diet Patient reports consuming 2 meals a day and 2 snack(s) a day Patient reports that his primary diet is: Regular Patient reports that she does have regular access to food.   Depression Screen PHQ 2/9 Scores 04/14/2019 03/09/2019 12/05/2018 08/30/2018 05/30/2018 04/08/2018 02/22/2018  PHQ - 2 Score 0 0 0 0 0 0 0  PHQ- 9 Score - - - - - - -     Fall Risk Fall Risk  04/14/2019 03/09/2019 08/30/2018 05/30/2018 04/08/2018  Falls in the past year? 0 0 0 No No  Number falls in past yr: 0 - - - -  Injury with Fall? 0 - - - -  Follow up Falls prevention  discussed - - - -  Comment get rid of all throw rugs in the house, adequate lighting in walkways and grab bars in the bathroom - - - -     Objective:  Dean Mcconnell seemed alert and oriented and he participated appropriately during our telephone visit.  Blood Pressure Weight BMI  BP Readings from Last 3 Encounters:  03/09/19 140/76  08/30/18 124/64  05/30/18 126/77   Wt Readings from Last 3 Encounters:  03/09/19 218 lb (98.9 kg)  08/30/18 219 lb (99.3 kg)  05/30/18 213 lb (96.6 kg)   BMI Readings from Last 1 Encounters:  03/09/19 30.40 kg/m    *Unable to obtain current vital signs, weight, and BMI due to telephone visit type  Hearing/Vision  . Trandon did not seem to have difficulty with hearing/understanding during the telephone conversation . Reports that he has not had a formal eye exam by an eye care professional within the past year . Reports that he has not had a formal hearing evaluation within the past year *Unable to fully assess hearing and vision during telephone visit type  Cognitive Function: 6CIT Screen 04/14/2019  What Year? 0 points  What month? 0 points  What time? 0 points  Count back from 20 0 points  Months in reverse 0 points  Repeat phrase 2 points  Total Score 2   (Normal:0-7, Significant for Dysfunction:  >8)  Normal Cognitive Function Screening: Yes   Immunization & Health Maintenance Record Immunization History  Administered Date(s) Administered  . Td 09/04/2009    Health Maintenance  Topic Date Due  . Fecal DNA (Cologuard)  01/23/1997  . TETANUS/TDAP  09/05/2019  . Hepatitis C Screening  Completed  . INFLUENZA VACCINE  Discontinued  . PNA vac Low Risk Adult  Discontinued       Assessment  This is a routine wellness examination for Jaylan Servatius.  Health Maintenance: Due or Overdue Health Maintenance Due  Topic Date Due  . Fecal DNA (Cologuard)  01/23/1997    Dean Mcconnell does not need a referral for Community Assistance: Care Management:   no Social Work:    no Prescription Assistance:  no Nutrition/Diabetes Education:  no   Plan:  Personalized Goals Goals Addressed            This Visit's Progress   . DIET - INCREASE WATER INTAKE       Try to drink 6-8 glasses of water daily.      Personalized Health Maintenance & Screening Recommendations  Pt declines all recommended vaccines at this time  Lung Cancer Screening Recommended: yes-pt declines at this time (Low Dose CT Chest recommended if Age 66-80 years, 30 pack-year currently smoking OR have quit w/in past 15 years) Hepatitis C Screening recommended: no HIV Screening recommended: no  Advanced Directives: Written information was not prepared per patient's request.  Referrals & Orders No orders of the defined types were placed in this encounter.   Follow-up Plan . Follow-up with Dean Pretty, FNP as planned . Bring a copy of your Advanced Directives in for our records   I have personally reviewed and noted the following in the patient's chart:   . Medical and social history . Use of alcohol, tobacco or illicit drugs  . Current medications and supplements . Functional ability and status . Nutritional status . Physical activity . Advanced directives . List of other physicians .  Hospitalizations, surgeries, and ER visits in previous 12 months . Vitals . Screenings to include cognitive,  depression, and falls . Referrals and appointments  In addition, I have reviewed and discussed with Dean Mcconnell certain preventive protocols, quality metrics, and best practice recommendations. A written personalized care plan for preventive services as well as general preventive health recommendations is available and can be mailed to the patient at his request.      Marylin Crosby, LPN  [id number]  I have reviewed and agree with the above AWV documentation.   Evelina Dun, FNP

## 2019-05-05 DIAGNOSIS — M170 Bilateral primary osteoarthritis of knee: Secondary | ICD-10-CM | POA: Diagnosis not present

## 2019-06-12 ENCOUNTER — Ambulatory Visit: Payer: Self-pay | Admitting: Nurse Practitioner

## 2019-06-13 ENCOUNTER — Ambulatory Visit (INDEPENDENT_AMBULATORY_CARE_PROVIDER_SITE_OTHER): Payer: Medicare Other | Admitting: Nurse Practitioner

## 2019-06-13 ENCOUNTER — Encounter: Payer: Self-pay | Admitting: Nurse Practitioner

## 2019-06-13 ENCOUNTER — Other Ambulatory Visit: Payer: Self-pay

## 2019-06-13 VITALS — BP 120/70 | HR 87 | Temp 96.0°F | Ht 71.0 in | Wt 219.8 lb

## 2019-06-13 DIAGNOSIS — E782 Mixed hyperlipidemia: Secondary | ICD-10-CM | POA: Diagnosis not present

## 2019-06-13 DIAGNOSIS — F325 Major depressive disorder, single episode, in full remission: Secondary | ICD-10-CM

## 2019-06-13 DIAGNOSIS — I10 Essential (primary) hypertension: Secondary | ICD-10-CM

## 2019-06-13 DIAGNOSIS — F17200 Nicotine dependence, unspecified, uncomplicated: Secondary | ICD-10-CM

## 2019-06-13 DIAGNOSIS — Z6828 Body mass index (BMI) 28.0-28.9, adult: Secondary | ICD-10-CM

## 2019-06-13 MED ORDER — LOSARTAN POTASSIUM 100 MG PO TABS: 100.0000 mg | ORAL_TABLET | Freq: Every day | ORAL | 1 refills | Status: DC

## 2019-06-13 MED ORDER — ATORVASTATIN CALCIUM 40 MG PO TABS: 40.0000 mg | ORAL_TABLET | Freq: Every day | ORAL | 1 refills | Status: DC

## 2019-06-13 MED ORDER — HYDROCHLOROTHIAZIDE 25 MG PO TABS: 25.0000 mg | ORAL_TABLET | Freq: Every day | ORAL | 1 refills | Status: DC

## 2019-06-13 NOTE — Progress Notes (Signed)
Subjective:    Patient ID: Dean Mcconnell, male    DOB: August 25, 1945, 73 y.o.   MRN: [id number]   Chief Complaint: Medical Management of Chronic Issues and Hypertension    HPI:  1. Essential hypertension No c/o chest pain, sob or headache. doesn't check blood pressure at home. BP Readings from Last 3 Encounters:  06/13/19 120/70  03/09/19 140/76  08/30/18 124/64     2. Mixed hyperlipidemia Ha not been watching diet. He has been using dumbbells at home. Lab Results  Component Value Date   CHOL 112 03/09/2019   HDL 38 (L) 03/09/2019   LDLCALC 60 03/09/2019   TRIG 68 03/09/2019   CHOLHDL 2.9 03/09/2019     3. Major depressive disorder with single episode, in full remission ([place]) He is no longer on any meds. He says he is doing well other than tired of sitting at home. Depression screen [facility] 2/9 06/13/2019 04/14/2019 03/09/2019  Decreased Interest 0 0 0  Down, Depressed, Hopeless 0 0 0  PHQ - 2 Score 0 0 0  Altered sleeping - - -  Tired, decreased energy - - -  Change in appetite - - -  Feeling bad or failure about yourself  - - -  Trouble concentrating - - -  Moving slowly or fidgety/restless - - -  Suicidal thoughts - - -  PHQ-9 Score - - -  Difficult doing work/chores - - -  Some recent data might be hidden     4. Smoker smoks over a pack a day. Is trying to cut back.  5. BMI 28.0-28.9,adult Weight is up 6lbs from last visit Wt Readings from Last 3 Encounters:  06/13/19 219 lb 12.8 oz (99.7 kg)  03/09/19 218 lb (98.9 kg)  08/30/18 219 lb (99.3 kg)   BMI Readings from Last 3 Encounters:  06/13/19 30.66 kg/m  03/09/19 30.40 kg/m  08/30/18 30.54 kg/m       Outpatient Encounter Medications as of 06/13/2019  Medication Sig  . atorvastatin (LIPITOR) 40 MG tablet Take 1 tablet (40 mg total) by mouth daily.  . cetirizine (ZYRTEC) 10 MG tablet Take 1 tablet (10 mg total) by mouth daily.  . fluticasone (FLONASE) 50 MCG/ACT nasal spray Place 2 sprays into  both nostrils daily.  . hydrochlorothiazide (HYDRODIURIL) 25 MG tablet Take 1 tablet (25 mg total) by mouth daily.  Marland Kitchen losartan (COZAAR) 100 MG tablet Take 1 tablet (100 mg total) by mouth daily.  . sildenafil (VIAGRA) 100 MG tablet 1 po prn     Past Surgical History:  Procedure Laterality Date  . SKIN CANCER EXCISION     nose    Family History  Problem Relation Age of Onset  . Hypertension Father     New complaints: None today  Social history: Lives by hisself. He has family that checks on him daily.  Controlled substance contract: n/a     Review of Systems  Constitutional: Negative for activity change and appetite change.  HENT: Negative.   Eyes: Negative for pain.  Respiratory: Negative for shortness of breath.   Cardiovascular: Negative for chest pain, palpitations and leg swelling.  Gastrointestinal: Negative for abdominal pain.  Endocrine: Negative for polydipsia.  Genitourinary: Negative.   Skin: Negative for rash.  Neurological: Negative for dizziness, weakness and headaches.  Hematological: Does not bruise/bleed easily.  Psychiatric/Behavioral: Negative.   All other systems reviewed and are negative.      Objective:   Physical Exam Vitals signs and nursing note reviewed.  Constitutional:      Appearance: Normal appearance. He is well-developed.  HENT:     Head: Normocephalic.     Nose: Nose normal.  Eyes:     Pupils: Pupils are equal, round, and reactive to light.  Neck:     Musculoskeletal: Normal range of motion and neck supple.     Thyroid: No thyroid mass or thyromegaly.     Vascular: No carotid bruit or JVD.     Trachea: Phonation normal.  Cardiovascular:     Rate and Rhythm: Normal rate and regular rhythm.  Pulmonary:     Effort: Pulmonary effort is normal. No respiratory distress.     Breath sounds: Normal breath sounds.  Abdominal:     General: Bowel sounds are normal.     Palpations: Abdomen is soft.     Tenderness: There is no  abdominal tenderness.  Musculoskeletal: Normal range of motion.  Lymphadenopathy:     Cervical: No cervical adenopathy.  Skin:    General: Skin is warm and dry.  Neurological:     Mental Status: He is alert and oriented to person, place, and time.  Psychiatric:        Behavior: Behavior normal.        Thought Content: Thought content normal.        Judgment: Judgment normal.     BP 120/70   Pulse 87   Temp (!) 96 F (35.6 C) (Temporal)   Ht _0  (1.803 m)   Wt 219 lb 12.8 oz (99.7 kg)   SpO2 99%   BMI 30.66 kg/m        Assessment & Plan:  Dean Mcconnell comes in today with chief complaint of Medical Management of Chronic Issues and Hypertension   Diagnosis and orders addressed:  1. Essential hypertension Low sodium diet - hydrochlorothiazide (HYDRODIURIL) 25 MG tablet; Take 1 tablet (25 mg total) by mouth daily.  Dispense: 90 tablet; Refill: 1 - losartan (COZAAR) 100 MG tablet; Take 1 tablet (100 mg total) by mouth daily.  Dispense: 90 tablet; Refill: 1  2. Mixed hyperlipidemia Low fat diet - atorvastatin (LIPITOR) 40 MG tablet; Take 1 tablet (40 mg total) by mouth daily.  Dispense: 90 tablet; Refill: 1  3. Major depressive disorder with single episode, in full remission ([place]) stress management  4. Smoker Smoking cessation encouraged  5. BMI 28.0-28.9,adult Discussed diet and exercise for person with BMI >25 Will recheck weight in 3-6 months  Orders Placed This Encounter  Procedures  . CMP14+EGFR  . Lipid panel    Labs pending Health Maintenance reviewed Diet and exercise encouraged  Follow up plan: 3 months   Mary-Margaret Hassell Done, FNP

## 2019-06-13 NOTE — Patient Instructions (Signed)
Exercising to Stay Healthy To become healthy and stay healthy, it is recommended that you do moderate-intensity and vigorous-intensity exercise. You can tell that you are exercising at a moderate intensity if your heart starts beating faster and you start breathing faster but can still hold a conversation. You can tell that you are exercising at a vigorous intensity if you are breathing much harder and faster and cannot hold a conversation while exercising. Exercising regularly is important. It has many health benefits, such as:  Improving overall fitness, flexibility, and endurance.  Increasing bone density.  Helping with weight control.  Decreasing body fat.  Increasing muscle strength.  Reducing stress and tension.  Improving overall health. How often should I exercise? Choose an activity that you enjoy, and set realistic goals. Your health care provider can help you make an activity plan that works for you. Exercise regularly as told by your health care provider. This may include:  Doing strength training two times a week, such as: ? Lifting weights. ? Using resistance bands. ? Push-ups. ? Sit-ups. ? Yoga.  Doing a certain intensity of exercise for a given amount of time. Choose from these options: ? A total of 150 minutes of moderate-intensity exercise every week. ? A total of 75 minutes of vigorous-intensity exercise every week. ? A mix of moderate-intensity and vigorous-intensity exercise every week. Children, pregnant women, people who have not exercised regularly, people who are overweight, and older adults may need to talk with a health care provider about what activities are safe to do. If you have a medical condition, be sure to talk with your health care provider before you start a new exercise program. What are some exercise ideas? Moderate-intensity exercise ideas include:  Walking 1 mile (1.6 km) in about 15 minutes.  Biking.  Hiking.  Golfing.  Dancing.   Water aerobics. Vigorous-intensity exercise ideas include:  Walking 4.5 miles (7.2 km) or more in about 1 hour.  Jogging or running 5 miles (8 km) in about 1 hour.  Biking 10 miles (16.1 km) or more in about 1 hour.  Lap swimming.  Roller-skating or in-line skating.  Cross-country skiing.  Vigorous competitive sports, such as football, basketball, and soccer.  Jumping rope.  Aerobic dancing. What are some everyday activities that can help me to get exercise?  Yard work, such as: ? Pushing a lawn mower. ? Raking and bagging leaves.  Washing your car.  Pushing a stroller.  Shoveling snow.  Gardening.  Washing windows or floors. How can I be more active in my day-to-day activities?  Use stairs instead of an elevator.  Take a walk during your lunch break.  If you drive, park your car farther away from your work or school.  If you take public transportation, get off one stop early and walk the rest of the way.  Stand up or walk around during all of your indoor phone calls.  Get up, stretch, and walk around every 30 minutes throughout the day.  Enjoy exercise with a friend. Support to continue exercising will help you keep a regular routine of activity. What guidelines can I follow while exercising?  Before you start a new exercise program, talk with your health care provider.  Do not exercise so much that you hurt yourself, feel dizzy, or get very short of breath.  Wear comfortable clothes and wear shoes with good support.  Drink plenty of water while you exercise to prevent dehydration or heat stroke.  Work out until your breathing   and your heartbeat get faster. Where to find more information  U.S[place] Department of Health and Human Services: www.hhs.gov  Centers for Disease Control and Prevention (CDC): www.cdc.gov Summary  Exercising regularly is important. It will improve your overall fitness, flexibility, and endurance.  Regular exercise also will  improve your overall health. It can help you control your weight, reduce stress, and improve your bone density.  Do not exercise so much that you hurt yourself, feel dizzy, or get very short of breath.  Before you start a new exercise program, talk with your health care provider. This information is not intended to replace advice given to you by your health care provider. Make sure you discuss any questions you have with your health care provider. Document Released: 08/22/2010 Document Revised: 07/02/2017 Document Reviewed: 06/10/2017 Elsevier Patient Education  2020 Elsevier Inc.  

## 2019-06-14 LAB — CMP14+EGFR
ALT: 13 IU/L (ref 0–44)
AST: 14 IU/L (ref 0–40)
Albumin/Globulin Ratio: 2 (ref 1.2–2.2)
Albumin: 4.1 g/dL (ref 3.7–4.7)
Alkaline Phosphatase: 113 IU/L (ref 39–117)
BUN/Creatinine Ratio: 11 (ref 10–24)
BUN: 21 mg/dL (ref 8–27)
Bilirubin Total: 0.6 mg/dL (ref 0.0–1.2)
CO2: 27 mmol/L (ref 20–29)
Calcium: 9.3 mg/dL (ref 8.6–10.2)
Chloride: 102 mmol/L (ref 96–106)
Creatinine, Ser: 1.98 mg/dL — ABNORMAL HIGH (ref 0.76–1.27)
GFR calc Af Amer: 38 mL/min/{1.73_m2} — ABNORMAL LOW (ref >59)
GFR calc non Af Amer: 33 mL/min/{1.73_m2} — ABNORMAL LOW (ref >59)
Globulin, Total: 2.1 g/dL (ref 1.5–4.5)
Glucose: 89 mg/dL (ref 65–99)
Potassium: 3.8 mmol/L (ref 3.5–5.2)
Sodium: 141 mmol/L (ref 134–144)
Total Protein: 6.2 g/dL (ref 6.0–8.5)

## 2019-06-14 LAB — LIPID PANEL
Chol/HDL Ratio: 3.3 ratio (ref 0.0–5.0)
Cholesterol, Total: 133 mg/dL (ref 100–199)
HDL: 40 mg/dL (ref >39)
LDL Chol Calc (NIH): 73 mg/dL (ref 0–99)
Triglycerides: 108 mg/dL (ref 0–149)
VLDL Cholesterol Cal: 20 mg/dL (ref 5–40)

## 2019-06-16 ENCOUNTER — Telehealth: Payer: Self-pay | Admitting: Nurse Practitioner

## 2019-06-16 NOTE — Telephone Encounter (Signed)
Refer to lab results. LM

## 2019-09-13 ENCOUNTER — Other Ambulatory Visit: Payer: Self-pay

## 2019-09-14 ENCOUNTER — Ambulatory Visit (INDEPENDENT_AMBULATORY_CARE_PROVIDER_SITE_OTHER): Payer: Medicare Other | Admitting: Nurse Practitioner

## 2019-09-14 ENCOUNTER — Ambulatory Visit (INDEPENDENT_AMBULATORY_CARE_PROVIDER_SITE_OTHER): Payer: Medicare Other

## 2019-09-14 ENCOUNTER — Encounter: Payer: Self-pay | Admitting: Nurse Practitioner

## 2019-09-14 VITALS — BP 138/76 | HR 81 | Temp 98.2°F | Resp 20 | Ht 71.0 in | Wt 220.0 lb

## 2019-09-14 DIAGNOSIS — F17200 Nicotine dependence, unspecified, uncomplicated: Secondary | ICD-10-CM | POA: Diagnosis not present

## 2019-09-14 DIAGNOSIS — F325 Major depressive disorder, single episode, in full remission: Secondary | ICD-10-CM | POA: Diagnosis not present

## 2019-09-14 DIAGNOSIS — I10 Essential (primary) hypertension: Secondary | ICD-10-CM

## 2019-09-14 DIAGNOSIS — E782 Mixed hyperlipidemia: Secondary | ICD-10-CM

## 2019-09-14 DIAGNOSIS — Z1211 Encounter for screening for malignant neoplasm of colon: Secondary | ICD-10-CM

## 2019-09-14 DIAGNOSIS — Z6828 Body mass index (BMI) 28.0-28.9, adult: Secondary | ICD-10-CM | POA: Diagnosis not present

## 2019-09-14 DIAGNOSIS — H6123 Impacted cerumen, bilateral: Secondary | ICD-10-CM | POA: Diagnosis not present

## 2019-09-14 DIAGNOSIS — N529 Male erectile dysfunction, unspecified: Secondary | ICD-10-CM

## 2019-09-14 DIAGNOSIS — J449 Chronic obstructive pulmonary disease, unspecified: Secondary | ICD-10-CM | POA: Diagnosis not present

## 2019-09-14 DIAGNOSIS — Z1212 Encounter for screening for malignant neoplasm of rectum: Secondary | ICD-10-CM

## 2019-09-14 DIAGNOSIS — Z125 Encounter for screening for malignant neoplasm of prostate: Secondary | ICD-10-CM

## 2019-09-14 MED ORDER — HYDROCHLOROTHIAZIDE 25 MG PO TABS: 25.0000 mg | ORAL_TABLET | Freq: Every day | ORAL | 1 refills | Status: DC

## 2019-09-14 MED ORDER — LOSARTAN POTASSIUM 100 MG PO TABS: 100.0000 mg | ORAL_TABLET | Freq: Every day | ORAL | 1 refills | Status: DC

## 2019-09-14 MED ORDER — ATORVASTATIN CALCIUM 40 MG PO TABS: 40.0000 mg | ORAL_TABLET | Freq: Every day | ORAL | 1 refills | Status: DC

## 2019-09-14 NOTE — Addendum Note (Signed)
Addended by: Rolena Infante on: 09/14/2019 03:39 PM   Modules accepted: Orders

## 2019-09-14 NOTE — Patient Instructions (Signed)
Earwax Buildup, Adult The ears produce a substance called earwax that helps keep bacteria out of the ear and protects the skin in the ear canal. Occasionally, earwax can build up in the ear and cause discomfort or hearing loss. What increases the risk? This condition is more likely to develop in people who:  Are male.  Are elderly.  Naturally produce more earwax.  Clean their ears often with cotton swabs.  Use earplugs often.  Use in-ear headphones often.  Wear hearing aids.  Have narrow ear canals.  Have earwax that is overly thick or sticky.  Have eczema.  Are dehydrated.  Have excess hair in the ear canal. What are the signs or symptoms? Symptoms of this condition include:  Reduced or muffled hearing.  A feeling of fullness in the ear or feeling that the ear is plugged.  Fluid coming from the ear.  Ear pain.  Ear itch.  Ringing in the ear.  Coughing.  An obvious piece of earwax that can be seen inside the ear canal. How is this diagnosed? This condition may be diagnosed based on:  Your symptoms.  Your medical history.  An ear exam. During the exam, your health care provider will look into your ear with an instrument called an otoscope. You may have tests, including a hearing test. How is this treated? This condition may be treated by:  Using ear drops to soften the earwax.  Having the earwax removed by a health care provider. The health care provider may: ? Flush the ear with water. ? Use an instrument that has a loop on the end (curette). ? Use a suction device.  Surgery to remove the wax buildup. This may be done in severe cases. Follow these instructions at home:   Take over-the-counter and prescription medicines only as told by your health care provider.  Do not put any objects, including cotton swabs, into your ear. You can clean the opening of your ear canal with a washcloth or facial tissue.  Follow instructions from your health care  provider about cleaning your ears. Do not over-clean your ears.  Drink enough fluid to keep your urine clear or pale yellow. This will help to thin the earwax.  Keep all follow-up visits as told by your health care provider. If earwax builds up in your ears often or if you use hearing aids, consider seeing your health care provider for routine, preventive ear cleanings. Ask your health care provider how often you should schedule your cleanings.  If you have hearing aids, clean them according to instructions from the manufacturer and your health care provider. Contact a health care provider if:  You have ear pain.  You develop a fever.  You have blood, pus, or other fluid coming from your ear.  You have hearing loss.  You have ringing in your ears that does not go away.  Your symptoms do not improve with treatment.  You feel like the room is spinning (vertigo). Summary  Earwax can build up in the ear and cause discomfort or hearing loss.  The most common symptoms of this condition include reduced or muffled hearing and a feeling of fullness in the ear or feeling that the ear is plugged.  This condition may be diagnosed based on your symptoms, your medical history, and an ear exam.  This condition may be treated by using ear drops to soften the earwax or by having the earwax removed by a health care provider.  Do not put any   objects, including cotton swabs, into your ear. You can clean the opening of your ear canal with a washcloth or facial tissue. This information is not intended to replace advice given to you by your health care provider. Make sure you discuss any questions you have with your health care provider. Document Revised: 07/02/2017 Document Reviewed: 09/30/2016 Elsevier Patient Education  2020 Elsevier Inc.  

## 2019-09-14 NOTE — Progress Notes (Signed)
Subjective:    Patient ID: Dean Mcconnell, male    DOB: Feb 03, 1946, 74 y.o.   MRN: [id number]   Chief Complaint: Medical Management of Chronic Issues    HPI:  1. Essential hypertension Does not take BP at home. Does not watch sodium in diet. Denies chest pain, SOB, or headaches. BP Readings from Last 3 Encounters:  09/14/19 138/76  06/13/19 120/70  03/09/19 140/76     2. Mixed hyperlipidemia Does not watch fat or cholesterol in diet. Takes lipitor every day. Lifts weights every day. Lab Results  Component Value Date   CHOL 133 06/13/2019   HDL 40 06/13/2019   LDLCALC 73 06/13/2019   TRIG 108 06/13/2019   CHOLHDL 3.3 06/13/2019     3. Major depressive disorder with single episode, in full remission ([place]) Not feeling depressed lately. Depression screen [facility] 2/9 09/14/2019 06/13/2019 04/14/2019  Decreased Interest 0 0 0  Down, Depressed, Hopeless 0 0 0  PHQ - 2 Score 0 0 0  Altered sleeping - - -  Tired, decreased energy - - -  Change in appetite - - -  Feeling bad or failure about yourself  - - -  Trouble concentrating - - -  Moving slowly or fidgety/restless - - -  Suicidal thoughts - - -  PHQ-9 Score - - -  Difficult doing work/chores - - -  Some recent data might be hidden     4. BMI 28.0-28.9,adult No significant changes in weight. Wt Readings from Last 3 Encounters:  09/14/19 220 lb (99.8 kg)  06/13/19 219 lb 12.8 oz (99.7 kg)  03/09/19 218 lb (98.9 kg)   BMI Readings from Last 3 Encounters:  09/14/19 30.68 kg/m  06/13/19 30.66 kg/m  03/09/19 30.40 kg/m     5. Smoker Still smoking but has cut back to 1/2-1 pack a day.  6. Erectile dysfunction, unspecified erectile dysfunction type Has not used viagra recently.    Outpatient Encounter Medications as of 09/14/2019  Medication Sig  . atorvastatin (LIPITOR) 40 MG tablet Take 1 tablet (40 mg total) by mouth daily.  . cetirizine (ZYRTEC) 10 MG tablet Take 1 tablet (10 mg total) by mouth daily.    . fluticasone (FLONASE) 50 MCG/ACT nasal spray Place 2 sprays into both nostrils daily.  . hydrochlorothiazide (HYDRODIURIL) 25 MG tablet Take 1 tablet (25 mg total) by mouth daily.  Marland Kitchen losartan (COZAAR) 100 MG tablet Take 1 tablet (100 mg total) by mouth daily.  . sildenafil (VIAGRA) 100 MG tablet 1 po prn   No facility-administered encounter medications on file as of 09/14/2019.    Past Surgical History:  Procedure Laterality Date  . SKIN CANCER EXCISION     nose    Family History  Problem Relation Age of Onset  . Hypertension Father     New complaints: None   Social history: Lives alone with dog. Wife passed away 7 years ago. Has one daughter who lives in [place]. Keeps in contact with her regularly.   Controlled substance contract: N/A     Review of Systems  Constitutional: Negative.   HENT: Negative.   Eyes: Negative.   Respiratory: Negative.   Cardiovascular: Negative.   Gastrointestinal: Negative.   Endocrine: Negative.   Genitourinary: Negative.   Musculoskeletal: Negative.   Skin: Negative.   Allergic/Immunologic: Negative.   Neurological: Negative.   Hematological: Negative.   Psychiatric/Behavioral: Negative.        Objective:   Physical Exam Vitals and nursing note reviewed.  Constitutional:      Appearance: Normal appearance.  HENT:     Head: Normocephalic.     Right Ear: Ear canal and external ear normal. There is impacted cerumen.     Left Ear: Ear canal and external ear normal. There is impacted cerumen.     Nose: Nose normal.     Mouth/Throat:     Mouth: Mucous membranes are moist.     Pharynx: Oropharynx is clear.  Eyes:     Conjunctiva/sclera: Conjunctivae normal.     Pupils: Pupils are equal, round, and reactive to light.  Cardiovascular:     Rate and Rhythm: Normal rate and regular rhythm.     Pulses: Normal pulses.     Heart sounds: Normal heart sounds.  Pulmonary:     Effort: Pulmonary effort is normal.     Breath sounds:  Normal breath sounds.  Abdominal:     General: Abdomen is flat. Bowel sounds are normal.     Palpations: Abdomen is soft.  Musculoskeletal:        General: Normal range of motion.     Cervical back: Normal range of motion and neck supple.  Skin:    General: Skin is warm and dry.     Capillary Refill: Capillary refill takes less than 2 seconds.  Neurological:     General: No focal deficit present.     Mental Status: He is alert and oriented to person, place, and time.  Psychiatric:        Mood and Affect: Mood normal.        Behavior: Behavior normal.        Thought Content: Thought content normal.        Judgment: Judgment normal.    EKG- NSR- Mary-Margaret Hassell Done, FNP  Chest xray- mild bronchitic changes-Preliminary reading by Ronnald Collum, FNP  [facility]     BP 138/76   Pulse 81   Temp 98.2 F (36.8 C) (Temporal)   Resp 20   Ht 5\' 11"  (1.803 m)   Wt 220 lb (99.8 kg)   SpO2 98%   BMI 30.68 kg/m     Assessment & Plan:  Dean Mcconnell comes in today with chief complaint of Medical Management of Chronic Issues   Diagnosis and orders addressed:  1. Essential hypertension Low sodium diet.  2. Mixed hyperlipidemia Low fat/low cholesterol diet. Continue weight-bearing exercise.  3. Major depressive disorder with single episode, in full remission ([place]) No issues currently. PHQ9 SCORE ONLY 09/14/2019 06/13/2019 04/14/2019  Score 0 0 0     4. BMI 28.0-28.9,adult Discussed diet and exercise for person with BMI >25 Will recheck weight in 3-6 months   5. Smoker Smokes 1/2-1 pack a day and has no desire to stop.  6. Erectile dysfunction, unspecified erectile dysfunction type Take viagra prn.  7. Bilateral cerumen impaction Cerumen removed from ears bilaterally. Use debrox drops.  Ear Cerumen Removal  Date/Time: 09/14/2019 12:07 PM Performed by: Chevis Pretty, FNP Authorized by: Chevis Pretty, FNP   Anesthesia: Local Anesthetic: none Location  details: right ear Procedure type: irrigation  Sedation: Patient sedated: no   Ear Cerumen Removal  Date/Time: 09/14/2019 12:08 PM Performed by: Chevis Pretty, FNP Authorized by: Chevis Pretty, FNP   Anesthesia: Local Anesthetic: none Location details: left ear Procedure type: irrigation  Sedation: Patient sedated: no     Labs pending Health Maintenance reviewed Diet and exercise encouraged  Follow up plan: 3 months   [place], FNP

## 2019-09-15 LAB — CBC WITH DIFFERENTIAL/PLATELET
Basophils Absolute: 0.1 10*3/uL (ref 0.0–0.2)
Basos: 1 %
EOS (ABSOLUTE): 0.1 10*3/uL (ref 0.0–0.4)
Eos: 1 %
Hematocrit: 44.5 % (ref 37.5–51.0)
Hemoglobin: 15.3 g/dL (ref 13.0–17.7)
Immature Grans (Abs): 0 10*3/uL (ref 0.0–0.1)
Immature Granulocytes: 0 %
Lymphocytes Absolute: 3.1 10*3/uL (ref 0.7–3.1)
Lymphs: 32 %
MCH: 31.9 pg (ref 26.6–33.0)
MCHC: 34.4 g/dL (ref 31.5–35.7)
MCV: 93 fL (ref 79–97)
Monocytes Absolute: 1 10*3/uL — ABNORMAL HIGH (ref 0.1–0.9)
Monocytes: 11 %
Neutrophils Absolute: 5.4 10*3/uL (ref 1.4–7.0)
Neutrophils: 55 %
Platelets: 184 10*3/uL (ref 150–450)
RBC: 4.8 x10E6/uL (ref 4.14–5.80)
RDW: 13 % (ref 11.6–15.4)
WBC: 9.7 10*3/uL (ref 3.4–10.8)

## 2019-09-15 LAB — CMP14+EGFR
ALT: 11 IU/L (ref 0–44)
AST: 13 IU/L (ref 0–40)
Albumin/Globulin Ratio: 2.2 (ref 1.2–2.2)
Albumin: 4.3 g/dL (ref 3.7–4.7)
Alkaline Phosphatase: 128 IU/L — ABNORMAL HIGH (ref 39–117)
BUN/Creatinine Ratio: 10 (ref 10–24)
BUN: 17 mg/dL (ref 8–27)
Bilirubin Total: 0.6 mg/dL (ref 0.0–1.2)
CO2: 29 mmol/L (ref 20–29)
Calcium: 9.4 mg/dL (ref 8.6–10.2)
Chloride: 101 mmol/L (ref 96–106)
Creatinine, Ser: 1.71 mg/dL — ABNORMAL HIGH (ref 0.76–1.27)
GFR calc Af Amer: 45 mL/min/{1.73_m2} — ABNORMAL LOW (ref >59)
GFR calc non Af Amer: 39 mL/min/{1.73_m2} — ABNORMAL LOW (ref >59)
Globulin, Total: 2 g/dL (ref 1.5–4.5)
Glucose: 87 mg/dL (ref 65–99)
Potassium: 3.9 mmol/L (ref 3.5–5.2)
Sodium: 142 mmol/L (ref 134–144)
Total Protein: 6.3 g/dL (ref 6.0–8.5)

## 2019-09-15 LAB — LIPID PANEL
Chol/HDL Ratio: 2.8 ratio (ref 0.0–5.0)
Cholesterol, Total: 118 mg/dL (ref 100–199)
HDL: 42 mg/dL (ref >39)
LDL Chol Calc (NIH): 57 mg/dL (ref 0–99)
Triglycerides: 102 mg/dL (ref 0–149)
VLDL Cholesterol Cal: 19 mg/dL (ref 5–40)

## 2019-09-15 LAB — PSA, TOTAL AND FREE
PSA, Free Pct: 32 %
PSA, Free: 0.16 ng/mL
Prostate Specific Ag, Serum: 0.5 ng/mL (ref 0.0–4.0)

## 2019-11-14 ENCOUNTER — Telehealth: Payer: Self-pay | Admitting: Nurse Practitioner

## 2019-11-14 NOTE — Chronic Care Management (AMB) (Signed)
  Chronic Care Management   Outreach Note  11/14/2019 Name: Tavian Ferretiz MRN: [id number] DOB: 24-Feb-1946  Camarie Regen is a 74 y.o. year old male who is a primary care patient of Chevis Pretty, FNP. I reached out to [name] by phone today in response to a referral sent by Mr. Zoey Repinski health plan.     An unsuccessful telephone outreach was attempted today. The patient was referred to the case management team for assistance with care management and care coordination.   Follow Up Plan: A HIPPA compliant phone message was left for the patient providing contact information and requesting a return call.  The care management team will reach out to the patient again over the next 7 days. If patient returns call to provider office, please advise to call [place] at [phone].  [place], [place] [address] Direct Dial: [phone] [email or web address]

## 2019-11-17 NOTE — Chronic Care Management (AMB) (Signed)
  Chronic Care Management   Outreach Note  11/17/2019 Name: Zakariye Persad MRN: [id number] DOB: 01/03/1946  Viktor Giangregorio is a 74 y.o. year old male who is a primary care patient of Chevis Pretty, FNP. I reached out to [name] by phone today in response to a referral sent by Mr. Feliciano Babcock health plan.     A second unsuccessful telephone outreach was attempted today. The patient was referred to the case management team for assistance with care management and care coordination.   Follow Up Plan: A HIPPA compliant phone message was left for the patient providing contact information and requesting a return call. The care management team will reach out to the patient again over the next 7 days. If patient returns call to provider office, please advise to call [place] at [phone].  [place], [place] [address] Direct Dial: [phone] [email or web address] Website: [email or web address]

## 2019-11-20 NOTE — Chronic Care Management (AMB) (Signed)
  Chronic Care Management   Outreach Note  11/20/2019 Name: Dean Mcconnell MRN: [id number] DOB: 31-Jan-1946  Dean Mcconnell is a 74 y.o. year old male who is a primary care patient of Chevis Pretty, FNP. I reached out to Dean Mcconnell by phone today in response to a referral sent by Dean Mcconnell health plan.     Third unsuccessful telephone outreach was attempted today. The patient was referred to the case management team for assistance with care management and care coordination. The patient's primary care provider has been notified of our unsuccessful attempts to make or maintain contact with the patient. The care management team is pleased to engage with this patient at any time in the future should he/she be interested in assistance from the care management team.   Follow Up Plan: A HIPPA compliant phone message was left for the patient providing contact information and requesting a return call. The patient has been provided with contact information for the care management team and has been advised to call with any health related questions or concerns. If patient returns call to provider office, please advise to call [place] at [phone].  [place], [place] [address] Direct Dial: [phone] [email or web address] Website: [email or web address]

## 2019-12-19 ENCOUNTER — Other Ambulatory Visit: Payer: Self-pay

## 2019-12-19 ENCOUNTER — Ambulatory Visit (INDEPENDENT_AMBULATORY_CARE_PROVIDER_SITE_OTHER): Payer: Medicare Other | Admitting: Nurse Practitioner

## 2019-12-19 ENCOUNTER — Encounter: Payer: Self-pay | Admitting: Nurse Practitioner

## 2019-12-19 VITALS — BP 127/71 | HR 75 | Temp 98.0°F | Resp 20 | Ht 71.0 in | Wt 213.0 lb

## 2019-12-19 DIAGNOSIS — F17200 Nicotine dependence, unspecified, uncomplicated: Secondary | ICD-10-CM | POA: Diagnosis not present

## 2019-12-19 DIAGNOSIS — E782 Mixed hyperlipidemia: Secondary | ICD-10-CM

## 2019-12-19 DIAGNOSIS — Z6829 Body mass index (BMI) 29.0-29.9, adult: Secondary | ICD-10-CM

## 2019-12-19 DIAGNOSIS — I10 Essential (primary) hypertension: Secondary | ICD-10-CM

## 2019-12-19 DIAGNOSIS — F325 Major depressive disorder, single episode, in full remission: Secondary | ICD-10-CM

## 2019-12-19 DIAGNOSIS — E876 Hypokalemia: Secondary | ICD-10-CM

## 2019-12-19 MED ORDER — HYDROCHLOROTHIAZIDE 25 MG PO TABS: 25.0000 mg | ORAL_TABLET | Freq: Every day | ORAL | 1 refills | Status: DC

## 2019-12-19 MED ORDER — LOSARTAN POTASSIUM 100 MG PO TABS: 100.0000 mg | ORAL_TABLET | Freq: Every day | ORAL | 1 refills | Status: DC

## 2019-12-19 MED ORDER — ATORVASTATIN CALCIUM 40 MG PO TABS: 40.0000 mg | ORAL_TABLET | Freq: Every day | ORAL | 1 refills | Status: DC

## 2019-12-19 NOTE — Progress Notes (Signed)
Subjective:    Patient ID: Dean Mcconnell, male    DOB: 1946-06-26, 74 y.o.   MRN: [id number]   Chief Complaint: Medical Management of Chronic Issues    HPI:  1. Essential hypertension No c/o chest pain, sob or headaches. Does not check blood pressure at home BP Readings from Last 3 Encounters:  12/19/19 127/71  09/14/19 138/76  06/13/19 120/70     2. Mixed hyperlipidemia Watches diet and does daily exercise. Lab Results  Component Value Date   CHOL 118 09/14/2019   HDL 42 09/14/2019   LDLCALC 57 09/14/2019   TRIG 102 09/14/2019   CHOLHDL 2.8 09/14/2019     3. Major depressive disorder with single episode, in full remission ([place]) Is doing well and I currently on no medication. Depression screen [facility] 2/9 12/19/2019 12/19/2019 09/14/2019  Decreased Interest 0 0 0  Down, Depressed, Hopeless 0 0 0  PHQ - 2 Score 0 0 0  Altered sleeping 0 - -  Tired, decreased energy 0 - -  Change in appetite 0 - -  Feeling bad or failure about yourself  0 - -  Trouble concentrating 0 - -  Moving slowly or fidgety/restless 0 - -  Suicidal thoughts 0 - -  PHQ-9 Score 0 - -  Difficult doing work/chores - - -  Some recent data might be hidden     4. Smoker Smokes over a pack a day. Has no desire to quit.  5. BMI 29.0-29.9,adult Weight is down 7lbs since last visit Wt Readings from Last 3 Encounters:  12/19/19 213 lb (96.6 kg)  09/14/19 220 lb (99.8 kg)  06/13/19 219 lb 12.8 oz (99.7 kg)    BMI Readings from Last 3 Encounters:  12/19/19 29.71 kg/m  09/14/19 30.68 kg/m  06/13/19 30.66 kg/m      Outpatient Encounter Medications as of 12/19/2019  Medication Sig  . atorvastatin (LIPITOR) 40 MG tablet Take 1 tablet (40 mg total) by mouth daily.  . cetirizine (ZYRTEC) 10 MG tablet Take 1 tablet (10 mg total) by mouth daily.  . fluticasone (FLONASE) 50 MCG/ACT nasal spray Place 2 sprays into both nostrils daily.  . hydrochlorothiazide (HYDRODIURIL) 25 MG tablet Take 1 tablet  (25 mg total) by mouth daily.  Marland Kitchen losartan (COZAAR) 100 MG tablet Take 1 tablet (100 mg total) by mouth daily.  . sildenafil (VIAGRA) 100 MG tablet 1 po prn     Past Surgical History:  Procedure Laterality Date  . SKIN CANCER EXCISION     nose    Family History  Problem Relation Age of Onset  . Hypertension Father     New complaints: None today  Social history: Lives by hisself  Controlled substance contract: n/a    Review of Systems  Constitutional: Negative for diaphoresis.  Eyes: Negative for pain.  Respiratory: Negative for shortness of breath.   Cardiovascular: Negative for chest pain, palpitations and leg swelling.  Gastrointestinal: Negative for abdominal pain.  Endocrine: Negative for polydipsia.  Skin: Negative for rash.  Neurological: Positive for facial asymmetry. Negative for dizziness, weakness and headaches.  Hematological: Does not bruise/bleed easily.  All other systems reviewed and are negative.      Objective:   Physical Exam Vitals and nursing note reviewed.  Constitutional:      Appearance: Normal appearance. He is well-developed.  HENT:     Head: Normocephalic.     Nose: Nose normal.  Eyes:     Pupils: Pupils are equal, round, and reactive  to light.  Neck:     Thyroid: No thyroid mass or thyromegaly.     Vascular: No carotid bruit or JVD.     Trachea: Phonation normal.  Cardiovascular:     Rate and Rhythm: Normal rate and regular rhythm.  Pulmonary:     Effort: Pulmonary effort is normal. No respiratory distress.     Breath sounds: Normal breath sounds.  Abdominal:     General: Bowel sounds are normal.     Palpations: Abdomen is soft.     Tenderness: There is no abdominal tenderness.  Musculoskeletal:        General: Normal range of motion.     Cervical back: Normal range of motion and neck supple.  Lymphadenopathy:     Cervical: No cervical adenopathy.  Skin:    General: Skin is warm and dry.  Neurological:     Mental  Status: He is alert and oriented to person, place, and time.  Psychiatric:        Behavior: Behavior normal.        Thought Content: Thought content normal.        Judgment: Judgment normal.     Blood pressure 127/71, pulse 75, temperature 98 F (36.7 C), temperature source Temporal, resp. rate 20, height 5' 11" (1.803 m), weight 213 lb (96.6 kg), SpO2 97 %.        Assessment & Plan:  Eyoel Throgmorton comes in today with chief complaint of Medical Management of Chronic Issues   Diagnosis and orders addressed:  1. Essential hypertension Low sodium diet - hydrochlorothiazide (HYDRODIURIL) 25 MG tablet; Take 1 tablet (25 mg total) by mouth daily.  Dispense: 90 tablet; Refill: 1 - losartan (COZAAR) 100 MG tablet; Take 1 tablet (100 mg total) by mouth daily.  Dispense: 90 tablet; Refill: 1 - CBC with Differential/Platelet - CMP14+EGFR  2. Mixed hyperlipidemia Low fat diet - atorvastatin (LIPITOR) 40 MG tablet; Take 1 tablet (40 mg total) by mouth daily.  Dispense: 90 tablet; Refill: 1 - Lipid panel  3. Major depressive disorder with single episode, in full remission [facility]) Stress management  4. Smoker Smoking cessation encouraged  5. BMI 29.0-29.9,adult Discussed diet and exercise for person with BMI >25 Will recheck weight in 3-6 months    Labs pending Health Maintenance reviewed Diet and exercise encouraged  Follow up plan: 3 moinths   Mary-Margaret Hassell Done, FNP

## 2019-12-20 LAB — LIPID PANEL
Chol/HDL Ratio: 2.8 ratio (ref 0.0–5.0)
Cholesterol, Total: 112 mg/dL (ref 100–199)
HDL: 40 mg/dL (ref >39)
LDL Chol Calc (NIH): 56 mg/dL (ref 0–99)
Triglycerides: 82 mg/dL (ref 0–149)
VLDL Cholesterol Cal: 16 mg/dL (ref 5–40)

## 2019-12-20 LAB — CMP14+EGFR
ALT: 11 IU/L (ref 0–44)
AST: 13 IU/L (ref 0–40)
Albumin/Globulin Ratio: 1.5 (ref 1.2–2.2)
Albumin: 3.8 g/dL (ref 3.7–4.7)
Alkaline Phosphatase: 123 IU/L — ABNORMAL HIGH (ref 48–121)
BUN/Creatinine Ratio: 9 — ABNORMAL LOW (ref 10–24)
BUN: 15 mg/dL (ref 8–27)
Bilirubin Total: 0.4 mg/dL (ref 0.0–1.2)
CO2: 28 mmol/L (ref 20–29)
Calcium: 9.2 mg/dL (ref 8.6–10.2)
Chloride: 98 mmol/L (ref 96–106)
Creatinine, Ser: 1.67 mg/dL — ABNORMAL HIGH (ref 0.76–1.27)
GFR calc Af Amer: 47 mL/min/{1.73_m2} — ABNORMAL LOW (ref >59)
GFR calc non Af Amer: 40 mL/min/{1.73_m2} — ABNORMAL LOW (ref >59)
Globulin, Total: 2.5 g/dL (ref 1.5–4.5)
Glucose: 87 mg/dL (ref 65–99)
Potassium: 3.4 mmol/L — ABNORMAL LOW (ref 3.5–5.2)
Sodium: 143 mmol/L (ref 134–144)
Total Protein: 6.3 g/dL (ref 6.0–8.5)

## 2019-12-20 LAB — CBC WITH DIFFERENTIAL/PLATELET
Basophils Absolute: 0.1 10*3/uL (ref 0.0–0.2)
Basos: 1 %
EOS (ABSOLUTE): 0.1 10*3/uL (ref 0.0–0.4)
Eos: 1 %
Hematocrit: 45.3 % (ref 37.5–51.0)
Hemoglobin: 15 g/dL (ref 13.0–17.7)
Immature Grans (Abs): 0.1 10*3/uL (ref 0.0–0.1)
Immature Granulocytes: 1 %
Lymphocytes Absolute: 2.5 10*3/uL (ref 0.7–3.1)
Lymphs: 28 %
MCH: 31.9 pg (ref 26.6–33.0)
MCHC: 33.1 g/dL (ref 31.5–35.7)
MCV: 96 fL (ref 79–97)
Monocytes Absolute: 1 10*3/uL — ABNORMAL HIGH (ref 0.1–0.9)
Monocytes: 11 %
Neutrophils Absolute: 5.3 10*3/uL (ref 1.4–7.0)
Neutrophils: 58 %
Platelets: 159 10*3/uL (ref 150–450)
RBC: 4.7 x10E6/uL (ref 4.14–5.80)
RDW: 13.4 % (ref 11.6–15.4)
WBC: 9 10*3/uL (ref 3.4–10.8)

## 2019-12-21 MED ORDER — POTASSIUM CHLORIDE CRYS ER 20 MEQ PO TBCR: 20.0000 meq | EXTENDED_RELEASE_TABLET | Freq: Every day | ORAL | 3 refills | Status: DC

## 2019-12-21 NOTE — Progress Notes (Signed)
PATIENT AWARE

## 2019-12-21 NOTE — Progress Notes (Addendum)
creatine some better Potassium low- kdur rx sent to pharmacy CBC normal\Cholesterol looks good

## 2019-12-21 NOTE — Addendum Note (Signed)
Addended by: Chevis Pretty on: 12/21/2019 03:33 PM   Modules accepted: Orders

## 2020-01-09 ENCOUNTER — Telehealth: Payer: Self-pay | Admitting: Nurse Practitioner

## 2020-01-10 ENCOUNTER — Other Ambulatory Visit: Payer: Self-pay | Admitting: Nurse Practitioner

## 2020-01-10 MED ORDER — OLOPATADINE HCL 0.2 % OP SOLN: 1.0000 [drp] | Freq: Every day | OPHTHALMIC | 2 refills | Status: DC

## 2020-01-10 NOTE — Progress Notes (Signed)
taday

## 2020-04-01 ENCOUNTER — Encounter: Payer: Self-pay | Admitting: Nurse Practitioner

## 2020-04-01 ENCOUNTER — Ambulatory Visit (INDEPENDENT_AMBULATORY_CARE_PROVIDER_SITE_OTHER): Payer: Medicare Other | Admitting: Nurse Practitioner

## 2020-04-01 ENCOUNTER — Other Ambulatory Visit: Payer: Self-pay

## 2020-04-01 VITALS — BP 128/76 | HR 73 | Temp 97.5°F | Resp 20 | Ht 71.0 in | Wt 216.0 lb

## 2020-04-01 DIAGNOSIS — F325 Major depressive disorder, single episode, in full remission: Secondary | ICD-10-CM | POA: Diagnosis not present

## 2020-04-01 DIAGNOSIS — F17200 Nicotine dependence, unspecified, uncomplicated: Secondary | ICD-10-CM | POA: Diagnosis not present

## 2020-04-01 DIAGNOSIS — E782 Mixed hyperlipidemia: Secondary | ICD-10-CM | POA: Diagnosis not present

## 2020-04-01 DIAGNOSIS — I10 Essential (primary) hypertension: Secondary | ICD-10-CM

## 2020-04-01 DIAGNOSIS — Z6828 Body mass index (BMI) 28.0-28.9, adult: Secondary | ICD-10-CM

## 2020-04-01 DIAGNOSIS — E876 Hypokalemia: Secondary | ICD-10-CM

## 2020-04-01 LAB — CBC WITH DIFFERENTIAL/PLATELET
Basophils Absolute: 0 10*3/uL (ref 0.0–0.2)
Basos: 1 %
EOS (ABSOLUTE): 0.1 10*3/uL (ref 0.0–0.4)
Eos: 1 %
Hematocrit: 44.4 % (ref 37.5–51.0)
Hemoglobin: 15.2 g/dL (ref 13.0–17.7)
Immature Grans (Abs): 0 10*3/uL (ref 0.0–0.1)
Immature Granulocytes: 0 %
Lymphocytes Absolute: 2.7 10*3/uL (ref 0.7–3.1)
Lymphs: 35 %
MCH: 32.1 pg (ref 26.6–33.0)
MCHC: 34.2 g/dL (ref 31.5–35.7)
MCV: 94 fL (ref 79–97)
Monocytes Absolute: 1 10*3/uL — ABNORMAL HIGH (ref 0.1–0.9)
Monocytes: 12 %
Neutrophils Absolute: 4.1 10*3/uL (ref 1.4–7.0)
Neutrophils: 51 %
Platelets: 200 10*3/uL (ref 150–450)
RBC: 4.74 x10E6/uL (ref 4.14–5.80)
RDW: 13.1 % (ref 11.6–15.4)
WBC: 8 10*3/uL (ref 3.4–10.8)

## 2020-04-01 LAB — CMP14+EGFR
ALT: 10 IU/L (ref 0–44)
AST: 11 IU/L (ref 0–40)
Albumin/Globulin Ratio: 1.8 (ref 1.2–2.2)
Albumin: 4.3 g/dL (ref 3.7–4.7)
Alkaline Phosphatase: 107 IU/L (ref 48–121)
BUN/Creatinine Ratio: 13 (ref 10–24)
BUN: 25 mg/dL (ref 8–27)
Bilirubin Total: 0.6 mg/dL (ref 0.0–1.2)
CO2: 25 mmol/L (ref 20–29)
Calcium: 9.6 mg/dL (ref 8.6–10.2)
Chloride: 100 mmol/L (ref 96–106)
Creatinine, Ser: 1.93 mg/dL — ABNORMAL HIGH (ref 0.76–1.27)
GFR calc Af Amer: 39 mL/min/{1.73_m2} — ABNORMAL LOW (ref >59)
GFR calc non Af Amer: 34 mL/min/{1.73_m2} — ABNORMAL LOW (ref >59)
Globulin, Total: 2.4 g/dL (ref 1.5–4.5)
Glucose: 98 mg/dL (ref 65–99)
Potassium: 4.5 mmol/L (ref 3.5–5.2)
Sodium: 140 mmol/L (ref 134–144)
Total Protein: 6.7 g/dL (ref 6.0–8.5)

## 2020-04-01 LAB — LIPID PANEL
Chol/HDL Ratio: 3.1 ratio (ref 0.0–5.0)
Cholesterol, Total: 146 mg/dL (ref 100–199)
HDL: 47 mg/dL (ref >39)
LDL Chol Calc (NIH): 81 mg/dL (ref 0–99)
Triglycerides: 98 mg/dL (ref 0–149)
VLDL Cholesterol Cal: 18 mg/dL (ref 5–40)

## 2020-04-01 MED ORDER — HYDROCHLOROTHIAZIDE 25 MG PO TABS: 25.0000 mg | ORAL_TABLET | Freq: Every day | ORAL | 1 refills | Status: DC

## 2020-04-01 MED ORDER — ATORVASTATIN CALCIUM 40 MG PO TABS: 40.0000 mg | ORAL_TABLET | Freq: Every day | ORAL | 1 refills | Status: DC

## 2020-04-01 MED ORDER — LOSARTAN POTASSIUM 100 MG PO TABS: 100.0000 mg | ORAL_TABLET | Freq: Every day | ORAL | 1 refills | Status: DC

## 2020-04-01 MED ORDER — POTASSIUM CHLORIDE CRYS ER 20 MEQ PO TBCR: 20.0000 meq | EXTENDED_RELEASE_TABLET | Freq: Every day | ORAL | 3 refills | Status: DC

## 2020-04-01 NOTE — Progress Notes (Signed)
Subjective:    Patient ID: Dean Mcconnell, male    DOB: [birth year], 74 y.o.   MRN: [id number]   Chief Complaint: Medical Management of Chronic Issues    HPI:  1. Essential hypertension No c/o chest pain, sob or headache. Doe snot check blood pressure at home. BP Readings from Last 3 Encounters:  04/01/20 128/76  12/19/19 127/71  09/14/19 138/76     2. Mixed hyperlipidemia Does not wtach diet and does no dedicated exercises. Lab Results  Component Value Date   CHOL 112 12/19/2019   HDL 40 12/19/2019   LDLCALC 56 12/19/2019   TRIG 82 12/19/2019   CHOLHDL 2.8 12/19/2019     3. Major depressive disorder with single episode, in full remission ([place]) Is currently on no medication says he is doing well.  Depression screen [facility] 2/9 04/01/2020 12/19/2019 12/19/2019  Decreased Interest 0 0 0  Down, Depressed, Hopeless 0 0 0  PHQ - 2 Score 0 0 0  Altered sleeping - 0 -  Tired, decreased energy - 0 -  Change in appetite - 0 -  Feeling bad or failure about yourself  - 0 -  Trouble concentrating - 0 -  Moving slowly or fidgety/restless - 0 -  Suicidal thoughts - 0 -  PHQ-9 Score - 0 -  Difficult doing work/chores - - -  Some recent data might be hidden     4. Smoker Still smokes about 1/2 3/4 packs a day   5. BMI 28.0-28.9,adult No recent weight changes Wt Readings from Last 3 Encounters:  04/01/20 216 lb (98 kg)  12/19/19 213 lb (96.6 kg)  09/14/19 220 lb (99.8 kg)   BMI Readings from Last 3 Encounters:  04/01/20 30.13 kg/m  12/19/19 29.71 kg/m  09/14/19 30.68 kg/m       Outpatient Encounter Medications as of 04/01/2020  Medication Sig  . atorvastatin (LIPITOR) 40 MG tablet Take 1 tablet (40 mg total) by mouth daily.  . cetirizine (ZYRTEC) 10 MG tablet Take 1 tablet (10 mg total) by mouth daily.  . fluticasone (FLONASE) 50 MCG/ACT nasal spray Place 2 sprays into both nostrils daily.  . hydrochlorothiazide (HYDRODIURIL) 25 MG tablet Take 1 tablet (25 mg  total) by mouth daily.  Marland Kitchen losartan (COZAAR) 100 MG tablet Take 1 tablet (100 mg total) by mouth daily.  . Olopatadine HCl (PATADAY) 0.2 % SOLN Apply 1 drop to eye daily.  . potassium chloride SA (KLOR-CON) 20 MEQ tablet Take 1 tablet (20 mEq total) by mouth daily.  . sildenafil (VIAGRA) 100 MG tablet 1 po prn     Past Surgical History:  Procedure Laterality Date  . SKIN CANCER EXCISION     nose    Family History  Problem Relation Age of Onset  . Hypertension Father     New complaints: None today  Social history: Patient lives by hisself. He is currently painting the inside of his house  Controlled substance contract: n/a    Review of Systems  Constitutional: Negative for diaphoresis.  Eyes: Negative for pain.  Respiratory: Negative for shortness of breath.   Cardiovascular: Negative for chest pain, palpitations and leg swelling.  Gastrointestinal: Negative for abdominal pain.  Endocrine: Negative for polydipsia.  Skin: Negative for rash.  Neurological: Negative for dizziness, weakness and headaches.  Hematological: Does not bruise/bleed easily.  All other systems reviewed and are negative.      Objective:   Physical Exam Vitals and nursing note reviewed.  Constitutional:  Appearance: Normal appearance. He is well-developed.  HENT:     Head: Normocephalic.     Nose: Nose normal.  Eyes:     Pupils: Pupils are equal, round, and reactive to light.  Neck:     Thyroid: No thyroid mass or thyromegaly.     Vascular: No carotid bruit or JVD.     Trachea: Phonation normal.  Cardiovascular:     Rate and Rhythm: Normal rate and regular rhythm.  Pulmonary:     Effort: Pulmonary effort is normal. No respiratory distress.     Breath sounds: Normal breath sounds.  Abdominal:     General: Bowel sounds are normal.     Palpations: Abdomen is soft.     Tenderness: There is no abdominal tenderness.  Musculoskeletal:        General: Normal range of motion.      Cervical back: Normal range of motion and neck supple.  Lymphadenopathy:     Cervical: No cervical adenopathy.  Skin:    General: Skin is warm and dry.  Neurological:     Mental Status: He is alert and oriented to person, place, and time.  Psychiatric:        Behavior: Behavior normal.        Thought Content: Thought content normal.        Judgment: Judgment normal.    BP 128/76   Pulse 73   Temp (!) 97.5 F (36.4 C) (Temporal)   Resp 20   Ht 5\' 11"  (1.803 m)   Wt 216 lb (98 kg)   SpO2 99%   BMI 30.13 kg/m         Assessment & Plan:  [name] comes in today with chief complaint of Medical Management of Chronic Issues   Diagnosis and orders addressed:  1. Essential hypertension Low sodium diet - hydrochlorothiazide (HYDRODIURIL) 25 MG tablet; Take 1 tablet (25 mg total) by mouth daily.  Dispense: 90 tablet; Refill: 1 - losartan (COZAAR) 100 MG tablet; Take 1 tablet (100 mg total) by mouth daily.  Dispense: 90 tablet; Refill: 1  2. Mixed hyperlipidemia Low fat diet - atorvastatin (LIPITOR) 40 MG tablet; Take 1 tablet (40 mg total) by mouth daily.  Dispense: 90 tablet; Refill: 1  3. Major depressive disorder with single episode, in full remission [facility]) Stress management  4. Smoker Smoking cessation encouraged  5. BMI 28.0-28.9,adult Discussed diet and exercise for person with BMI >25 Will recheck weight in 3-6 months  6. Hypokalemia - potassium chloride SA (KLOR-CON) 20 MEQ tablet; Take 1 tablet (20 mEq total) by mouth daily.  Dispense: 30 tablet; Refill: 3   Labs pending Health Maintenance reviewed Diet and exercise encouraged  Follow up plan: 3 months   Mary-Margaret Hassell Done, FNP

## 2020-04-01 NOTE — Patient Instructions (Signed)

## 2020-04-01 NOTE — Addendum Note (Signed)
Addended by: Rolena Infante on: 04/01/2020 11:20 AM   Modules accepted: Orders

## 2020-04-16 ENCOUNTER — Other Ambulatory Visit: Payer: Self-pay

## 2020-04-16 ENCOUNTER — Ambulatory Visit (INDEPENDENT_AMBULATORY_CARE_PROVIDER_SITE_OTHER): Payer: Medicare Other | Admitting: *Deleted

## 2020-04-16 DIAGNOSIS — Z0000 Encounter for general adult medical examination without abnormal findings: Secondary | ICD-10-CM

## 2020-04-16 NOTE — Progress Notes (Signed)
MEDICARE ANNUAL WELLNESS VISIT  04/16/2020  Telephone Visit Disclaimer This Medicare AWV was conducted by telephone due to national recommendations for restrictions regarding the COVID-19 Pandemic (e.g. social distancing).  I verified, using two identifiers, that I am speaking with Dean Mcconnell or their authorized healthcare agent. I discussed the limitations, risks, security, and privacy concerns of performing an evaluation and management service by telephone and the potential availability of an in-person appointment in the future. The patient expressed understanding and agreed to proceed.  Location of Patient: home Location of Provider (nurse): in office  Subjective:    Dean Mcconnell is a 74 y.o. male patient of Chevis Pretty, Cesar Chavez who had a Medicare Annual Wellness Visit today via telephone. Gavriel is Retired and lives alone. he has 1 children. he reports that he is not socially active and does interact with friends/family regularly. he is not physically active and enjoys working on house.  Patient Care Team: Chevis Pretty, FNP as PCP - General (Nurse Practitioner) Harlen Labs, MD as Referring Physician (Optometry)  Advanced Directives 04/16/2020 04/14/2019 04/08/2018 01/20/2017 11/29/2014 09/10/2014 06/07/2014  Does Patient Have a Medical Advance Directive? No Yes Yes No No No No  Type of Advance Directive - [place];Living will [place];Living will - - - -  Does patient want to make changes to medical advance directive? - No - Patient declined No - Patient declined Yes (MAU/Ambulatory/Procedural Areas - Information given) - - -  Copy of [place] in Chart? - No - copy requested No - copy requested - - - -  Would patient like information on creating a medical advance directive? No - Patient declined - - Yes (MAU/Ambulatory/Procedural Areas - Information given) (No Data) Yes - Scientist, clinical (histocompatibility and immunogenetics) given Yes -  Educational materials given    Hospital Utilization Over the Past 12 Months: # of hospitalizations or ER visits: 0 # of surgeries: 0  Review of Systems    Patient reports that his overall health is unchanged compared to last year.  History obtained from chart review and the patient  Patient Reported Readings (BP, Pulse, CBG, Weight, etc) none  Pain Assessment Pain : No/denies pain     Current Medications & Allergies (verified) Allergies as of 04/16/2020      Reactions   Penicillins Swelling      Medication List       Accurate as of April 16, 2020 10:48 AM. If you have any questions, ask your nurse or doctor.        atorvastatin 40 MG tablet Commonly known as: LIPITOR Take 1 tablet (40 mg total) by mouth daily.   cetirizine 10 MG tablet Commonly known as: ZYRTEC Take 1 tablet (10 mg total) by mouth daily.   fluticasone 50 MCG/ACT nasal spray Commonly known as: FLONASE Place 2 sprays into both nostrils daily.   hydrochlorothiazide 25 MG tablet Commonly known as: HYDRODIURIL Take 1 tablet (25 mg total) by mouth daily.   losartan 100 MG tablet Commonly known as: COZAAR Take 1 tablet (100 mg total) by mouth daily.   Olopatadine HCl 0.2 % Soln Commonly known as: Pataday Apply 1 drop to eye daily.   potassium chloride SA 20 MEQ tablet Commonly known as: KLOR-CON Take 1 tablet (20 mEq total) by mouth daily.   sildenafil 100 MG tablet Commonly known as: VIAGRA 1 po prn       History (reviewed): Past Medical History:  Diagnosis Date  .  Cancer ([place])    melanoma on nose  . Hyperlipidemia   . Hypertension    Past Surgical History:  Procedure Laterality Date  . SKIN CANCER EXCISION     nose   Family History  Problem Relation Age of Onset  . Hypertension Father    Social History   Socioeconomic History  . Marital status: Widowed    Spouse name: Not on file  . Number of children: 1  . Years of education: GED  . Highest education level:  GED or equivalent  Occupational History  . Occupation: Retired     Comment: Statistician  Tobacco Use  . Smoking status: Light Tobacco Smoker    Packs/day: 0.25    Years: 30.00    Pack years: 7.50    Types: Cigarettes  . Smokeless tobacco: Former Systems developer    Types: Chew    Quit date: 04/13/2009  Vaping Use  . Vaping Use: Never used  Substance and Sexual Activity  . Alcohol use: No  . Drug use: No  . Sexual activity: Not Currently  Other Topics Concern  . Not on file  Social History Narrative  . Not on file   Social Determinants of Health   Financial Resource Strain: Low Risk   . Difficulty of Paying Living Expenses: Not hard at all  Food Insecurity: No Food Insecurity  . Worried About Charity fundraiser in the Last Year: Never true  . Ran Out of Food in the Last Year: Never true  Transportation Needs: No Transportation Needs  . Lack of Transportation (Medical): No  . Lack of Transportation (Non-Medical): No  Physical Activity: Inactive  . Days of Exercise per Week: 0 days  . Minutes of Exercise per Session: 0 min  Stress: No Stress Concern Present  . Feeling of Stress : Only a little  Social Connections: Socially Isolated  . Frequency of Communication with Friends and Family: More than three times a week  . Frequency of Social Gatherings with Friends and Family: Three times a week  . Attends Religious Services: Never  . Active Member of Clubs or Organizations: No  . Attends Archivist Meetings: Never  . Marital Status: Widowed    Activities of Daily Living In your present state of health, do you have any difficulty performing the following activities: 04/16/2020  Hearing? N  Vision? Y  Comment glasses  Difficulty concentrating or making decisions? N  Walking or climbing stairs? N  Dressing or bathing? N  Doing errands, shopping? N  Preparing Food and eating ? N  Using the Toilet? N  In the past six months, have you accidently  leaked urine? N  Do you have problems with loss of bowel control? N  Managing your Medications? N  Managing your Finances? N  Housekeeping or managing your Housekeeping? N  Some recent data might be hidden    Patient Education/ Literacy How often do you need to have someone help you when you read instructions, pamphlets, or other written materials from your doctor or pharmacy?: 1 - Never What is the last grade level you completed in school?: GED  Exercise Current Exercise Habits: The patient does not participate in regular exercise at present, Exercise limited by: None identified  Diet Patient reports consuming 2 meals a day and 2 snack(s) a day Patient reports that his primary diet is: Regular Patient reports that she does have regular access to food.   Depression Screen PHQ 2/9 Scores 04/16/2020  04/01/2020 12/19/2019 12/19/2019 09/14/2019 06/13/2019 04/14/2019  PHQ - 2 Score 0 0 0 0 0 0 0  PHQ- 9 Score - - 0 - - - -     Fall Risk Fall Risk  04/16/2020 04/01/2020 12/19/2019 09/14/2019 06/13/2019  Falls in the past year? 0 0 0 0 0  Number falls in past yr: - - - - -  Injury with Fall? - - - - -  Follow up - - - - -  Comment - - - - -     Objective:  Dean Mcconnell seemed alert and oriented and he participated appropriately during our telephone visit.  Blood Pressure Weight BMI  BP Readings from Last 3 Encounters:  04/01/20 128/76  12/19/19 127/71  09/14/19 138/76   Wt Readings from Last 3 Encounters:  04/01/20 216 lb (98 kg)  12/19/19 213 lb (96.6 kg)  09/14/19 220 lb (99.8 kg)   BMI Readings from Last 1 Encounters:  04/01/20 30.13 kg/m    *Unable to obtain current vital signs, weight, and BMI due to telephone visit type  Hearing/Vision  . [place] did not seem to have difficulty with hearing/understanding during the telephone conversation . Reports that he has had a formal eye exam by an eye care professional within the past year . Reports that he has not had a formal hearing  evaluation within the past year *Unable to fully assess hearing and vision during telephone visit type  Cognitive Function: 6CIT Screen 04/16/2020 04/14/2019  What Year? 0 points 0 points  What month? 0 points 0 points  What time? 0 points 0 points  Count back from 20 0 points 0 points  Months in reverse 2 points 0 points  Repeat phrase 2 points 2 points  Total Score 4 2   (Normal:0-7, Significant for Dysfunction: >8)  Normal Cognitive Function Screening: Yes   Immunization & Health Maintenance Record Immunization History  Administered Date(s) Administered  . Moderna SARS-COVID-2 Vaccination 11/07/2019, 12/05/2019  . Td 09/04/2009    Health Maintenance  Topic Date Due  . Fecal DNA (Cologuard)  Never done  . TETANUS/TDAP  09/13/2020 (Originally 09/05/2019)  . COVID-19 Vaccine  Completed  . Hepatitis C Screening  Completed  . INFLUENZA VACCINE  Discontinued  . PNA vac Low Risk Adult  Discontinued       Assessment  This is a routine wellness examination for Richardson Dubree.  Health Maintenance: Due or Overdue Health Maintenance Due  Topic Date Due  . Fecal DNA (Cologuard)  Never done    [facility] does not need a referral for Community Assistance: Care Management:   no Social Work:    no Prescription Assistance:  no Nutrition/Diabetes Education:  no   Plan:  Personalized Goals Goals Addressed            This Visit's Progress   . DIET - EAT MORE FRUITS AND VEGETABLES      . DIET - INCREASE WATER INTAKE   Not on track    Try to drink 6-8 glasses of water daily.    . Exercise 3x per week (30 min per time)   Not on track     Personalized Health Maintenance & Screening Recommendations  Colorectal cancer screening shingles vaccine  Lung Cancer Screening Recommended: yes (Low Dose CT Chest recommended if Age 36-80 years, 30 pack-year currently smoking OR have quit w/in past 15 years) Hepatitis C Screening recommended: no HIV Screening recommended:  no  Advanced Directives: Written information was not prepared  per patient's request.  Referrals & Orders No orders of the defined types were placed in this encounter.   Follow-up Plan . Follow-up with Chevis Pretty, FNP as planned on 07/04/20. Marland Kitchen Pt needs cologuard, shingles vaccine . Pt refuses flu and pneumonia vaccines . Pt had covid vaccines . Pt is independent with all ADL's . Pt has no difficulties with hearing . Pt had to get glasses this year to drive. . Pt declined advanced directives. . Pt voices no healthcare concerns . AVS printed and mailed to pt.     I have personally reviewed and noted the following in the patient's chart:   . Medical and social history . Use of alcohol, tobacco or illicit drugs  . Current medications and supplements . Functional ability and status . Nutritional status . Physical activity . Advanced directives . List of other physicians . Hospitalizations, surgeries, and ER visits in previous 12 months . Vitals . Screenings to include cognitive, depression, and falls . Referrals and appointments  In addition, I have reviewed and discussed with Dean Mcconnell certain preventive protocols, quality metrics, and best practice recommendations. A written personalized care plan for preventive services as well as general preventive health recommendations is available and can be mailed to the patient at his request.      Rana Snare, LPN  [id number]

## 2020-05-03 ENCOUNTER — Other Ambulatory Visit: Payer: Self-pay | Admitting: Family

## 2020-05-03 DIAGNOSIS — J301 Allergic rhinitis due to pollen: Secondary | ICD-10-CM

## 2020-07-03 ENCOUNTER — Other Ambulatory Visit: Payer: Self-pay

## 2020-07-03 ENCOUNTER — Ambulatory Visit (INDEPENDENT_AMBULATORY_CARE_PROVIDER_SITE_OTHER): Payer: Medicare Other | Admitting: Nurse Practitioner

## 2020-07-03 ENCOUNTER — Encounter: Payer: Self-pay | Admitting: Nurse Practitioner

## 2020-07-03 VITALS — BP 132/80 | HR 78 | Temp 98.1°F | Resp 20 | Ht 71.0 in | Wt 215.0 lb

## 2020-07-03 DIAGNOSIS — F17200 Nicotine dependence, unspecified, uncomplicated: Secondary | ICD-10-CM | POA: Diagnosis not present

## 2020-07-03 DIAGNOSIS — I10 Essential (primary) hypertension: Secondary | ICD-10-CM

## 2020-07-03 DIAGNOSIS — E876 Hypokalemia: Secondary | ICD-10-CM | POA: Diagnosis not present

## 2020-07-03 DIAGNOSIS — F325 Major depressive disorder, single episode, in full remission: Secondary | ICD-10-CM

## 2020-07-03 DIAGNOSIS — E782 Mixed hyperlipidemia: Secondary | ICD-10-CM

## 2020-07-03 DIAGNOSIS — Z6830 Body mass index (BMI) 30.0-30.9, adult: Secondary | ICD-10-CM

## 2020-07-03 MED ORDER — POTASSIUM CHLORIDE CRYS ER 20 MEQ PO TBCR: 20.0000 meq | EXTENDED_RELEASE_TABLET | Freq: Every day | ORAL | 1 refills | Status: DC

## 2020-07-03 NOTE — Patient Instructions (Signed)
Exercising to Stay Healthy To become healthy and stay healthy, it is recommended that you do moderate-intensity and vigorous-intensity exercise. You can tell that you are exercising at a moderate intensity if your heart starts beating faster and you start breathing faster but can still hold a conversation. You can tell that you are exercising at a vigorous intensity if you are breathing much harder and faster and cannot hold a conversation while exercising. Exercising regularly is important. It has many health benefits, such as:  Improving overall fitness, flexibility, and endurance.  Increasing bone density.  Helping with weight control.  Decreasing body fat.  Increasing muscle strength.  Reducing stress and tension.  Improving overall health. How often should I exercise? Choose an activity that you enjoy, and set realistic goals. Your health care provider can help you make an activity plan that works for you. Exercise regularly as told by your health care provider. This may include:  Doing strength training two times a week, such as: ? Lifting weights. ? Using resistance bands. ? Push-ups. ? Sit-ups. ? Yoga.  Doing a certain intensity of exercise for a given amount of time. Choose from these options: ? A total of 150 minutes of moderate-intensity exercise every week. ? A total of 75 minutes of vigorous-intensity exercise every week. ? A mix of moderate-intensity and vigorous-intensity exercise every week. Children, pregnant women, people who have not exercised regularly, people who are overweight, and older adults may need to talk with a health care provider about what activities are safe to do. If you have a medical condition, be sure to talk with your health care provider before you start a new exercise program. What are some exercise ideas? Moderate-intensity exercise ideas include:  Walking 1 mile (1.6 km) in about 15  minutes.  Biking.  Hiking.  Golfing.  Dancing.  Water aerobics. Vigorous-intensity exercise ideas include:  Walking 4.5 miles (7.2 km) or more in about 1 hour.  Jogging or running 5 miles (8 km) in about 1 hour.  Biking 10 miles (16.1 km) or more in about 1 hour.  Lap swimming.  Roller-skating or in-line skating.  Cross-country skiing.  Vigorous competitive sports, such as football, basketball, and soccer.  Jumping rope.  Aerobic dancing. What are some everyday activities that can help me to get exercise?  Yard work, such as: ? Pushing a lawn mower. ? Raking and bagging leaves.  Washing your car.  Pushing a stroller.  Shoveling snow.  Gardening.  Washing windows or floors. How can I be more active in my day-to-day activities?  Use stairs instead of an elevator.  Take a walk during your lunch break.  If you drive, park your car farther away from your work or school.  If you take public transportation, get off one stop early and walk the rest of the way.  Stand up or walk around during all of your indoor phone calls.  Get up, stretch, and walk around every 30 minutes throughout the day.  Enjoy exercise with a friend. Support to continue exercising will help you keep a regular routine of activity. What guidelines can I follow while exercising?  Before you start a new exercise program, talk with your health care provider.  Do not exercise so much that you hurt yourself, feel dizzy, or get very short of breath.  Wear comfortable clothes and wear shoes with good support.  Drink plenty of water while you exercise to prevent dehydration or heat stroke.  Work out until your breathing   and your heartbeat get faster. Where to find more information  [place] Department of Health and Human Services: www.hhs.gov  Centers for Disease Control and Prevention (CDC): www.cdc.gov Summary  Exercising regularly is important. It will improve your overall fitness,  flexibility, and endurance.  Regular exercise also will improve your overall health. It can help you control your weight, reduce stress, and improve your bone density.  Do not exercise so much that you hurt yourself, feel dizzy, or get very short of breath.  Before you start a new exercise program, talk with your health care provider. This information is not intended to replace advice given to you by your health care provider. Make sure you discuss any questions you have with your health care provider. Document Revised: 07/02/2017 Document Reviewed: 06/10/2017 Elsevier Patient Education  2020 [employer]  

## 2020-07-03 NOTE — Progress Notes (Signed)
Subjective:    Patient ID: Dean Mcconnell, male    DOB: 1946-03-12, 74 y.o.   MRN: [id number]   Chief Complaint: Medical Management of Chronic Issues    HPI:  1. Primary hypertension No c/o chest pain, sob or headache. Does not check blood pressure at home. BP Readings from Last 3 Encounters:  04/01/20 128/76  12/19/19 127/71  09/14/19 138/76     2. Mixed hyperlipidemia Does not watch diet. He has been working out with free weights daily for the last 5 weeks. Lab Results  Component Value Date   CHOL 146 04/01/2020   HDL 47 04/01/2020   LDLCALC 81 04/01/2020   TRIG 98 04/01/2020   CHOLHDL 3.1 04/01/2020     3. Major depressive disorder with single episode, in full remission ([place]) Depression is in remission and he is on no medication. Depression screen [facility] 2/9 07/03/2020 04/16/2020 04/01/2020  Decreased Interest 0 0 0  Down, Depressed, Hopeless 0 0 0  PHQ - 2 Score 0 0 0  Altered sleeping 0 - -  Tired, decreased energy 0 - -  Change in appetite 0 - -  Feeling bad or failure about yourself  0 - -  Trouble concentrating 0 - -  Moving slowly or fidgety/restless 0 - -  Suicidal thoughts 0 - -  PHQ-9 Score 0 - -  Difficult doing work/chores Not difficult at all - -  Some recent data might be hidden     4. Smoker Smokes about 1/2 a pack a day.  5. hypokalemia He has ran out of his potassium supplements. Denies nay lower ext cramping. Lab Results  Component Value Date   K 4.5 04/01/2020     6.  BMI 28.0-28.9,adult No recent weight changes  Wt Readings from Last 3 Encounters:  07/03/20 215 lb (97.5 kg)  04/01/20 216 lb (98 kg)  12/19/19 213 lb (96.6 kg)    BMI Readings from Last 3 Encounters:  04/01/20 30.13 kg/m  12/19/19 29.71 kg/m  09/14/19 30.68 kg/m       Outpatient Encounter Medications as of 07/03/2020  Medication Sig  . atorvastatin (LIPITOR) 40 MG tablet Take 1 tablet (40 mg total) by mouth daily.  . cetirizine (ZYRTEC) 10 MG tablet Take  1 tablet (10 mg total) by mouth daily.  . fluticasone (FLONASE) 50 MCG/ACT nasal spray Place 2 sprays into both nostrils daily.  . hydrochlorothiazide (HYDRODIURIL) 25 MG tablet Take 1 tablet (25 mg total) by mouth daily.  Marland Kitchen losartan (COZAAR) 100 MG tablet Take 1 tablet (100 mg total) by mouth daily.  . Olopatadine HCl (PATADAY) 0.2 % SOLN Apply 1 drop to eye daily. (Patient not taking: Reported on 04/16/2020)  . potassium chloride SA (KLOR-CON) 20 MEQ tablet Take 1 tablet (20 mEq total) by mouth daily.  . sildenafil (VIAGRA) 100 MG tablet 1 po prn (Patient not taking: Reported on 04/16/2020)     Past Surgical History:  Procedure Laterality Date  . SKIN CANCER EXCISION     nose    Family History  Problem Relation Age of Onset  . Hypertension Father     New complaints: None today  Social history: Lives by hisself  Controlled substance contract: n/a    Review of Systems  Constitutional: Negative for diaphoresis.  Eyes: Negative for pain.  Respiratory: Negative for shortness of breath.   Cardiovascular: Negative for chest pain, palpitations and leg swelling.  Gastrointestinal: Negative for abdominal pain.  Endocrine: Negative for polydipsia.  Skin: Negative  for rash.  Neurological: Negative for dizziness, weakness and headaches.  Hematological: Does not bruise/bleed easily.  All other systems reviewed and are negative.      Objective:   Physical Exam Vitals and nursing note reviewed.  Constitutional:      Appearance: Normal appearance. He is well-developed.  HENT:     Head: Normocephalic.     Nose: Nose normal.  Eyes:     Pupils: Pupils are equal, round, and reactive to light.  Neck:     Thyroid: No thyroid mass or thyromegaly.     Vascular: No carotid bruit or JVD.     Trachea: Phonation normal.  Cardiovascular:     Rate and Rhythm: Normal rate and regular rhythm.  Pulmonary:     Effort: Pulmonary effort is normal. No respiratory distress.     Breath sounds:  Normal breath sounds.  Abdominal:     General: Bowel sounds are normal.     Palpations: Abdomen is soft.     Tenderness: There is no abdominal tenderness.  Musculoskeletal:        General: Normal range of motion.     Cervical back: Normal range of motion and neck supple.  Lymphadenopathy:     Cervical: No cervical adenopathy.  Skin:    General: Skin is warm and dry.  Neurological:     Mental Status: He is alert and oriented to person, place, and time.  Psychiatric:        Behavior: Behavior normal.        Thought Content: Thought content normal.        Judgment: Judgment normal.    BP 132/80   Pulse 78   Temp 98.1 F (36.7 C) (Temporal)   Resp 20   Ht _0  (1.803 m)   Wt 215 lb (97.5 kg)   SpO2 97%   BMI 29.99 kg/m          Assessment & Plan:  Dean Mcconnell comes in today with chief complaint of Medical Management of Chronic Issues   Diagnosis and orders addressed:  1. Primary hypertension Low sodium diet - CBC with Differential/Platelet - CMP14+EGFR  2. Mixed hyperlipidemia Low fat diet - Lipid panel  3. Major depressive disorder with single episode, in full remission [facility]) Stress management  4. Smoker Smoking cessation encouraged  5. Hypokalemia Labs pendoing - potassium chloride SA (KLOR-CON) 20 MEQ tablet; Take 1 tablet (20 mEq total) by mouth daily.  Dispense: 90 tablet; Refill: 1  6. BMI 30.0-30.9,adult Discussed diet and exercise for person with BMI >25 Will recheck weight in 3-6 months   Labs pending Health Maintenance reviewed Diet and exercise encouraged  Follow up plan: 3 months   Dean Hassell Done, FNP

## 2020-07-04 ENCOUNTER — Ambulatory Visit: Payer: Self-pay | Admitting: Nurse Practitioner

## 2020-07-04 LAB — CMP14+EGFR
ALT: 9 IU/L (ref 0–44)
AST: 11 IU/L (ref 0–40)
Albumin/Globulin Ratio: 1.7 (ref 1.2–2.2)
Albumin: 4 g/dL (ref 3.7–4.7)
Alkaline Phosphatase: 104 IU/L (ref 44–121)
BUN/Creatinine Ratio: 9 — ABNORMAL LOW (ref 10–24)
BUN: 15 mg/dL (ref 8–27)
Bilirubin Total: 0.5 mg/dL (ref 0.0–1.2)
CO2: 29 mmol/L (ref 20–29)
Calcium: 9.3 mg/dL (ref 8.6–10.2)
Chloride: 98 mmol/L (ref 96–106)
Creatinine, Ser: 1.74 mg/dL — ABNORMAL HIGH (ref 0.76–1.27)
GFR calc Af Amer: 44 mL/min/{1.73_m2} — ABNORMAL LOW (ref >59)
GFR calc non Af Amer: 38 mL/min/{1.73_m2} — ABNORMAL LOW (ref >59)
Globulin, Total: 2.4 g/dL (ref 1.5–4.5)
Glucose: 87 mg/dL (ref 65–99)
Potassium: 4.7 mmol/L (ref 3.5–5.2)
Sodium: 139 mmol/L (ref 134–144)
Total Protein: 6.4 g/dL (ref 6.0–8.5)

## 2020-07-04 LAB — CBC WITH DIFFERENTIAL/PLATELET
Basophils Absolute: 0.1 10*3/uL (ref 0.0–0.2)
Basos: 1 %
EOS (ABSOLUTE): 0.1 10*3/uL (ref 0.0–0.4)
Eos: 1 %
Hematocrit: 44.9 % (ref 37.5–51.0)
Hemoglobin: 14.9 g/dL (ref 13.0–17.7)
Immature Grans (Abs): 0 10*3/uL (ref 0.0–0.1)
Immature Granulocytes: 0 %
Lymphocytes Absolute: 2.9 10*3/uL (ref 0.7–3.1)
Lymphs: 37 %
MCH: 31.3 pg (ref 26.6–33.0)
MCHC: 33.2 g/dL (ref 31.5–35.7)
MCV: 94 fL (ref 79–97)
Monocytes Absolute: 0.9 10*3/uL (ref 0.1–0.9)
Monocytes: 11 %
Neutrophils Absolute: 4 10*3/uL (ref 1.4–7.0)
Neutrophils: 50 %
Platelets: 193 10*3/uL (ref 150–450)
RBC: 4.76 x10E6/uL (ref 4.14–5.80)
RDW: 13.2 % (ref 11.6–15.4)
WBC: 8 10*3/uL (ref 3.4–10.8)

## 2020-07-04 LAB — LIPID PANEL
Chol/HDL Ratio: 3 ratio (ref 0.0–5.0)
Cholesterol, Total: 134 mg/dL (ref 100–199)
HDL: 45 mg/dL (ref >39)
LDL Chol Calc (NIH): 74 mg/dL (ref 0–99)
Triglycerides: 75 mg/dL (ref 0–149)
VLDL Cholesterol Cal: 15 mg/dL (ref 5–40)

## 2020-10-01 ENCOUNTER — Ambulatory Visit (INDEPENDENT_AMBULATORY_CARE_PROVIDER_SITE_OTHER): Payer: Medicare Other | Admitting: Nurse Practitioner

## 2020-10-01 ENCOUNTER — Encounter: Payer: Self-pay | Admitting: Nurse Practitioner

## 2020-10-01 ENCOUNTER — Other Ambulatory Visit: Payer: Self-pay

## 2020-10-01 VITALS — BP 137/73 | HR 77 | Temp 97.8°F | Resp 20 | Ht 71.0 in | Wt 218.0 lb

## 2020-10-01 DIAGNOSIS — F325 Major depressive disorder, single episode, in full remission: Secondary | ICD-10-CM | POA: Diagnosis not present

## 2020-10-01 DIAGNOSIS — F17200 Nicotine dependence, unspecified, uncomplicated: Secondary | ICD-10-CM | POA: Diagnosis not present

## 2020-10-01 DIAGNOSIS — E782 Mixed hyperlipidemia: Secondary | ICD-10-CM | POA: Diagnosis not present

## 2020-10-01 DIAGNOSIS — E876 Hypokalemia: Secondary | ICD-10-CM

## 2020-10-01 DIAGNOSIS — I10 Essential (primary) hypertension: Secondary | ICD-10-CM | POA: Diagnosis not present

## 2020-10-01 DIAGNOSIS — Z125 Encounter for screening for malignant neoplasm of prostate: Secondary | ICD-10-CM

## 2020-10-01 DIAGNOSIS — Z6828 Body mass index (BMI) 28.0-28.9, adult: Secondary | ICD-10-CM

## 2020-10-01 MED ORDER — HYDROCHLOROTHIAZIDE 25 MG PO TABS: 25.0000 mg | ORAL_TABLET | Freq: Every day | ORAL | 1 refills | Status: DC

## 2020-10-01 MED ORDER — LOSARTAN POTASSIUM 100 MG PO TABS: 100.0000 mg | ORAL_TABLET | Freq: Every day | ORAL | 1 refills | Status: DC

## 2020-10-01 MED ORDER — POTASSIUM CHLORIDE CRYS ER 20 MEQ PO TBCR: 20.0000 meq | EXTENDED_RELEASE_TABLET | Freq: Every day | ORAL | 1 refills | Status: DC

## 2020-10-01 MED ORDER — ATORVASTATIN CALCIUM 40 MG PO TABS: 40.0000 mg | ORAL_TABLET | Freq: Every day | ORAL | 1 refills | Status: DC

## 2020-10-01 NOTE — Progress Notes (Signed)
Subjective:    Patient ID: Dean Mcconnell, male    DOB: Nov 01, 1945, 75 y.o.   MRN: [id number]   Chief Complaint: medical management of chronic issues     HPI:  1. Primary hypertension No c/o chest pain, sob or headache. Does not check blood pressure at home. BP Readings from Last 3 Encounters:  07/03/20 132/80  04/01/20 128/76  12/19/19 127/71     2. Mixed hyperlipidemia Does not watch diet but works out and [employer] daily.  Lab Results  Component Value Date   CHOL 134 07/03/2020   HDL 45 07/03/2020   LDLCALC 74 07/03/2020   TRIG 75 07/03/2020   CHOLHDL 3.0 07/03/2020   The 10-year ASCVD risk score Mikey Bussing DC Jr., et al., 2013) is: 26.6%   3. Major depressive disorder with single episode, in full remission ([place]) Is currently on no medication. Says he does not feel depressed. Depression screen [facility] 2/9 10/01/2020 07/03/2020 04/16/2020  Decreased Interest 0 0 0  Down, Depressed, Hopeless 0 0 0  PHQ - 2 Score 0 0 0  Altered sleeping 0 0 -  Tired, decreased energy 0 0 -  Change in appetite 0 0 -  Feeling bad or failure about yourself  0 0 -  Trouble concentrating 0 0 -  Moving slowly or fidgety/restless 0 0 -  Suicidal thoughts 0 0 -  PHQ-9 Score 0 0 -  Difficult doing work/chores Not difficult at all Not difficult at all -  Some recent data might be hidden    4. Smoker Still smoking about 3/4 pack a day.  5. BMI 28.0-28.9,adult Weight up 3lbs Wt Readings from Last 3 Encounters:  10/01/20 218 lb (98.9 kg)  07/03/20 215 lb (97.5 kg)  04/01/20 216 lb (98 kg)    BMI Readings from Last 3 Encounters:  07/03/20 29.99 kg/m  04/01/20 30.13 kg/m  12/19/19 29.71 kg/m       Outpatient Encounter Medications as of 10/01/2020  Medication Sig  . atorvastatin (LIPITOR) 40 MG tablet Take 1 tablet (40 mg total) by mouth daily.  . cetirizine (ZYRTEC) 10 MG tablet Take 1 tablet (10 mg total) by mouth daily.  . fluticasone (FLONASE) 50 MCG/ACT nasal spray Place 2 sprays  into both nostrils daily.  . hydrochlorothiazide (HYDRODIURIL) 25 MG tablet Take 1 tablet (25 mg total) by mouth daily.  Marland Kitchen losartan (COZAAR) 100 MG tablet Take 1 tablet (100 mg total) by mouth daily.  . potassium chloride SA (KLOR-CON) 20 MEQ tablet Take 1 tablet (20 mEq total) by mouth daily.  . sildenafil (VIAGRA) 100 MG tablet 1 po prn     Past Surgical History:  Procedure Laterality Date  . SKIN CANCER EXCISION     nose    Family History  Problem Relation Age of Onset  . Hypertension Father     New complaints: None today  Social history: Lives by hisself  Controlled substance contract: n/a    Review of Systems  Constitutional: Negative for diaphoresis.  Eyes: Negative for pain.  Respiratory: Negative for shortness of breath.   Cardiovascular: Negative for chest pain, palpitations and leg swelling.  Gastrointestinal: Negative for abdominal pain.  Endocrine: Negative for polydipsia.  Skin: Negative for rash.  Neurological: Negative for dizziness, weakness and headaches.  Hematological: Does not bruise/bleed easily.  All other systems reviewed and are negative.      Objective:   Physical Exam Vitals and nursing note reviewed.  Constitutional:      Appearance: Normal  appearance. He is well-developed and well-nourished.  HENT:     Head: Normocephalic.     Nose: Nose normal.     Mouth/Throat:     Mouth: Oropharynx is clear and moist.  Eyes:     Extraocular Movements: EOM normal.     Pupils: Pupils are equal, round, and reactive to light.  Neck:     Thyroid: No thyroid mass or thyromegaly.     Vascular: No carotid bruit or JVD.     Trachea: Phonation normal.  Cardiovascular:     Rate and Rhythm: Normal rate and regular rhythm.  Pulmonary:     Effort: Pulmonary effort is normal. No respiratory distress.     Breath sounds: Normal breath sounds.  Abdominal:     General: Bowel sounds are normal. Aorta is normal.     Palpations: Abdomen is soft.      Tenderness: There is no abdominal tenderness.  Musculoskeletal:        General: Normal range of motion.     Cervical back: Normal range of motion and neck supple.  Lymphadenopathy:     Cervical: No cervical adenopathy.  Skin:    General: Skin is warm and dry.  Neurological:     Mental Status: He is alert and oriented to person, place, and time.  Psychiatric:        Mood and Affect: Mood and affect normal.        Behavior: Behavior normal.        Thought Content: Thought content normal.        Judgment: Judgment normal.     BP 137/73   Pulse 77   Temp 97.8 F (36.6 C) (Temporal)   Resp 20   Ht 5' 11" (1.803 m)   Wt 218 lb (98.9 kg)   SpO2 99%   BMI 30.40 kg/m        Assessment & Plan:  Dean Mcconnell comes in today with chief complaint of Medical Management of Chronic Issues   Diagnosis and orders addressed:  1. Primary hypertension Low sodium diet - CBC with Differential/Platelet - CMP14+EGFR - hydrochlorothiazide (HYDRODIURIL) 25 MG tablet; Take 1 tablet (25 mg total) by mouth daily.  Dispense: 90 tablet; Refill: 1 - losartan (COZAAR) 100 MG tablet; Take 1 tablet (100 mg total) by mouth daily.  Dispense: 90 tablet; Refill: 1  2. Mixed hyperlipidemia Low fat diet - atorvastatin (LIPITOR) 40 MG tablet; Take 1 tablet (40 mg total) by mouth daily.  Dispense: 90 tablet; Refill: 1 - Lipid panel  3. Major depressive disorder with single episode, in full remission ([place]) Continue stress management  4. Smoker Continue to cut back on smoking  5. BMI 28.0-28.9,adult Discussed diet and exercise for person with BMI >25 Will recheck weight in 3-6 months  6.  Hypokalemia - potassium chloride SA (KLOR-CON) 20 MEQ tablet; Take 1 tablet (20 mEq total) by mouth daily.  Dispense: 90 tablet; Refill: 1  7. Screening for prostate cancer Labs pending - PSA, total and free   Labs pending Health Maintenance reviewed Diet and exercise encouraged  Follow up plan: 3  months   [place], FNP

## 2020-10-01 NOTE — Patient Instructions (Signed)
Exercising to Stay Healthy To become healthy and stay healthy, it is recommended that you do moderate-intensity and vigorous-intensity exercise. You can tell that you are exercising at a moderate intensity if your heart starts beating faster and you start breathing faster but can still hold a conversation. You can tell that you are exercising at a vigorous intensity if you are breathing much harder and faster and cannot hold a conversation while exercising. Exercising regularly is important. It has many health benefits, such as:  Improving overall fitness, flexibility, and endurance.  Increasing bone density.  Helping with weight control.  Decreasing body fat.  Increasing muscle strength.  Reducing stress and tension.  Improving overall health. How often should I exercise? Choose an activity that you enjoy, and set realistic goals. Your health care provider can help you make an activity plan that works for you. Exercise regularly as told by your health care provider. This may include:  Doing strength training two times a week, such as: ? Lifting weights. ? Using resistance bands. ? Push-ups. ? Sit-ups. ? Yoga.  Doing a certain intensity of exercise for a given amount of time. Choose from these options: ? A total of 150 minutes of moderate-intensity exercise every week. ? A total of 75 minutes of vigorous-intensity exercise every week. ? A mix of moderate-intensity and vigorous-intensity exercise every week. Children, pregnant women, people who have not exercised regularly, people who are overweight, and older adults may need to talk with a health care provider about what activities are safe to do. If you have a medical condition, be sure to talk with your health care provider before you start a new exercise program. What are some exercise ideas? Moderate-intensity exercise ideas include:  Walking 1 mile (1.6 km) in about 15  minutes.  Biking.  Hiking.  Golfing.  Dancing.  Water aerobics. Vigorous-intensity exercise ideas include:  Walking 4.5 miles (7.2 km) or more in about 1 hour.  Jogging or running 5 miles (8 km) in about 1 hour.  Biking 10 miles (16.1 km) or more in about 1 hour.  Lap swimming.  Roller-skating or in-line skating.  Cross-country skiing.  Vigorous competitive sports, such as football, basketball, and soccer.  Jumping rope.  Aerobic dancing.   What are some everyday activities that can help me to get exercise?  Yard work, such as: ? Pushing a lawn mower. ? Raking and bagging leaves.  Washing your car.  Pushing a stroller.  Shoveling snow.  Gardening.  Washing windows or floors. How can I be more active in my day-to-day activities?  Use stairs instead of an elevator.  Take a walk during your lunch break.  If you drive, park your car farther away from your work or school.  If you take public transportation, get off one stop early and walk the rest of the way.  Stand up or walk around during all of your indoor phone calls.  Get up, stretch, and walk around every 30 minutes throughout the day.  Enjoy exercise with a friend. Support to continue exercising will help you keep a regular routine of activity. What guidelines can I follow while exercising?  Before you start a new exercise program, talk with your health care provider.  Do not exercise so much that you hurt yourself, feel dizzy, or get very short of breath.  Wear comfortable clothes and wear shoes with good support.  Drink plenty of water while you exercise to prevent dehydration or heat stroke.  Work out until   your breathing and your heartbeat get faster. Where to find more information  [place] Department of Health and Human Services: www.hhs.gov  Centers for Disease Control and Prevention (CDC): www.cdc.gov Summary  Exercising regularly is important. It will improve your overall fitness,  flexibility, and endurance.  Regular exercise also will improve your overall health. It can help you control your weight, reduce stress, and improve your bone density.  Do not exercise so much that you hurt yourself, feel dizzy, or get very short of breath.  Before you start a new exercise program, talk with your health care provider. This information is not intended to replace advice given to you by your health care provider. Make sure you discuss any questions you have with your health care provider. Document Revised: 07/02/2017 Document Reviewed: 06/10/2017 Elsevier Patient Education  2021 Elsevier Inc.  

## 2020-10-02 LAB — LIPID PANEL
Chol/HDL Ratio: 2.7 ratio (ref 0.0–5.0)
Cholesterol, Total: 118 mg/dL (ref 100–199)
HDL: 44 mg/dL (ref >39)
LDL Chol Calc (NIH): 59 mg/dL (ref 0–99)
Triglycerides: 73 mg/dL (ref 0–149)
VLDL Cholesterol Cal: 15 mg/dL (ref 5–40)

## 2020-10-02 LAB — CMP14+EGFR
ALT: 10 IU/L (ref 0–44)
AST: 13 IU/L (ref 0–40)
Albumin/Globulin Ratio: 2.1 (ref 1.2–2.2)
Albumin: 4.2 g/dL (ref 3.7–4.7)
Alkaline Phosphatase: 107 IU/L (ref 44–121)
BUN/Creatinine Ratio: 9 — ABNORMAL LOW (ref 10–24)
BUN: 16 mg/dL (ref 8–27)
Bilirubin Total: 0.4 mg/dL (ref 0.0–1.2)
CO2: 24 mmol/L (ref 20–29)
Calcium: 9.4 mg/dL (ref 8.6–10.2)
Chloride: 104 mmol/L (ref 96–106)
Creatinine, Ser: 1.69 mg/dL — ABNORMAL HIGH (ref 0.76–1.27)
Globulin, Total: 2 g/dL (ref 1.5–4.5)
Glucose: 96 mg/dL (ref 65–99)
Potassium: 4.4 mmol/L (ref 3.5–5.2)
Sodium: 143 mmol/L (ref 134–144)
Total Protein: 6.2 g/dL (ref 6.0–8.5)
eGFR: 42 mL/min/{1.73_m2} — ABNORMAL LOW (ref >59)

## 2020-10-02 LAB — CBC WITH DIFFERENTIAL/PLATELET
Basophils Absolute: 0.1 10*3/uL (ref 0.0–0.2)
Basos: 1 %
EOS (ABSOLUTE): 0.1 10*3/uL (ref 0.0–0.4)
Eos: 1 %
Hematocrit: 43.4 % (ref 37.5–51.0)
Hemoglobin: 14.5 g/dL (ref 13.0–17.7)
Immature Grans (Abs): 0 10*3/uL (ref 0.0–0.1)
Immature Granulocytes: 0 %
Lymphocytes Absolute: 2.7 10*3/uL (ref 0.7–3.1)
Lymphs: 32 %
MCH: 31.6 pg (ref 26.6–33.0)
MCHC: 33.4 g/dL (ref 31.5–35.7)
MCV: 95 fL (ref 79–97)
Monocytes Absolute: 0.8 10*3/uL (ref 0.1–0.9)
Monocytes: 10 %
Neutrophils Absolute: 4.7 10*3/uL (ref 1.4–7.0)
Neutrophils: 56 %
Platelets: 161 10*3/uL (ref 150–450)
RBC: 4.59 x10E6/uL (ref 4.14–5.80)
RDW: 13.2 % (ref 11.6–15.4)
WBC: 8.4 10*3/uL (ref 3.4–10.8)

## 2020-10-02 LAB — PSA, TOTAL AND FREE
PSA, Free Pct: 40 %
PSA, Free: 0.16 ng/mL
Prostate Specific Ag, Serum: 0.4 ng/mL (ref 0.0–4.0)

## 2021-01-06 ENCOUNTER — Ambulatory Visit: Payer: Self-pay | Admitting: Nurse Practitioner

## 2021-01-10 ENCOUNTER — Other Ambulatory Visit: Payer: Self-pay

## 2021-01-10 ENCOUNTER — Ambulatory Visit (INDEPENDENT_AMBULATORY_CARE_PROVIDER_SITE_OTHER): Payer: Medicare Other | Admitting: Nurse Practitioner

## 2021-01-10 ENCOUNTER — Encounter: Payer: Self-pay | Admitting: Nurse Practitioner

## 2021-01-10 VITALS — BP 145/76 | HR 69 | Temp 97.6°F | Resp 20 | Ht 71.0 in | Wt 211.0 lb

## 2021-01-10 DIAGNOSIS — I10 Essential (primary) hypertension: Secondary | ICD-10-CM | POA: Diagnosis not present

## 2021-01-10 DIAGNOSIS — E876 Hypokalemia: Secondary | ICD-10-CM

## 2021-01-10 DIAGNOSIS — Z6829 Body mass index (BMI) 29.0-29.9, adult: Secondary | ICD-10-CM

## 2021-01-10 DIAGNOSIS — F325 Major depressive disorder, single episode, in full remission: Secondary | ICD-10-CM | POA: Diagnosis not present

## 2021-01-10 DIAGNOSIS — E782 Mixed hyperlipidemia: Secondary | ICD-10-CM | POA: Diagnosis not present

## 2021-01-10 DIAGNOSIS — F17200 Nicotine dependence, unspecified, uncomplicated: Secondary | ICD-10-CM

## 2021-01-10 MED ORDER — HYDROCHLOROTHIAZIDE 25 MG PO TABS
25.0000 mg | ORAL_TABLET | Freq: Every day | ORAL | 1 refills | Status: DC
Start: 2021-01-10 — End: 2021-04-14

## 2021-01-10 MED ORDER — POTASSIUM CHLORIDE CRYS ER 20 MEQ PO TBCR
20.0000 meq | EXTENDED_RELEASE_TABLET | Freq: Every day | ORAL | 1 refills | Status: DC
Start: 2021-01-10 — End: 2021-04-14

## 2021-01-10 MED ORDER — LOSARTAN POTASSIUM 100 MG PO TABS: 100.0000 mg | ORAL_TABLET | Freq: Every day | ORAL | 1 refills | Status: DC

## 2021-01-10 MED ORDER — ATORVASTATIN CALCIUM 40 MG PO TABS
40.0000 mg | ORAL_TABLET | Freq: Every day | ORAL | 1 refills | Status: DC
Start: 2021-01-10 — End: 2021-04-14

## 2021-01-10 NOTE — Progress Notes (Signed)
Subjective:    Patient ID: Dean Mcconnell, male    DOB: 1946-07-25, 75 y.o.   MRN: [id number]   Chief Complaint: Medical Management of Chronic Issues    HPI:  1. Primary hypertension No c/o chest pain, sob or headache. Does not check blood pressure at home. BP Readings from Last 3 Encounters:  01/10/21 (!) 145/76  10/01/20 137/73  07/03/20 132/80     2. Mixed hyperlipidemia Does try to watch diet and exercises daily. Lab Results  Component Value Date   CHOL 118 10/01/2020   HDL 44 10/01/2020   LDLCALC 59 10/01/2020   TRIG 73 10/01/2020   CHOLHDL 2.7 10/01/2020     3. Major depressive disorder with single episode, in full remission ([place]) Is currently on no meds andis doing welll. Depression screen [facility] 2/9 01/10/2021 10/01/2020 07/03/2020  Decreased Interest 0 0 0  Down, Depressed, Hopeless 0 0 0  PHQ - 2 Score 0 0 0  Altered sleeping 0 0 0  Tired, decreased energy 0 0 0  Change in appetite 0 0 0  Feeling bad or failure about yourself  0 0 0  Trouble concentrating 0 0 0  Moving slowly or fidgety/restless 0 0 0  Suicidal thoughts 0 0 0  PHQ-9 Score 0 0 0  Difficult doing work/chores Not difficult at all Not difficult at all Not difficult at all  Some recent data might be hidden     4. Smoker Smokes about 5-6 cigarettes a day. He is trying to quit  5. Hypokalemia No c/o lower ext cramping. Takes daily potassium supplement Lab Results  Component Value Date   K 4.4 10/01/2020     6. BMI 29.0-29.9,adult Weight is down 7lbs from previous Wt Readings from Last 3 Encounters:  01/10/21 211 lb (95.7 kg)  10/01/20 218 lb (98.9 kg)  07/03/20 215 lb (97.5 kg)   BMI Readings from Last 3 Encounters:  01/10/21 29.43 kg/m  10/01/20 30.40 kg/m  07/03/20 29.99 kg/m       Outpatient Encounter Medications as of 01/10/2021  Medication Sig   atorvastatin (LIPITOR) 40 MG tablet Take 1 tablet (40 mg total) by mouth daily.   cetirizine (ZYRTEC) 10 MG tablet Take 1  tablet (10 mg total) by mouth daily.   fluticasone (FLONASE) 50 MCG/ACT nasal spray Place 2 sprays into both nostrils daily.   hydrochlorothiazide (HYDRODIURIL) 25 MG tablet Take 1 tablet (25 mg total) by mouth daily.   losartan (COZAAR) 100 MG tablet Take 1 tablet (100 mg total) by mouth daily.   potassium chloride SA (KLOR-CON) 20 MEQ tablet Take 1 tablet (20 mEq total) by mouth daily.   sildenafil (VIAGRA) 100 MG tablet 1 po prn   No facility-administered encounter medications on file as of 01/10/2021.    Past Surgical History:  Procedure Laterality Date   SKIN CANCER EXCISION     nose    Family History  Problem Relation Age of Onset   Hypertension Father     New complaints: No problems  Social history: Lives by hisself  Controlled substance contract: n/a     Review of Systems  Constitutional:  Negative for diaphoresis.  Eyes:  Negative for pain.  Respiratory:  Negative for shortness of breath.   Cardiovascular:  Negative for chest pain, palpitations and leg swelling.  Gastrointestinal:  Negative for abdominal pain.  Endocrine: Negative for polydipsia.  Skin:  Negative for rash.  Neurological:  Negative for dizziness, weakness and headaches.  Hematological:  Does  not bruise/bleed easily.  All other systems reviewed and are negative.     Objective:   Physical Exam Vitals and nursing note reviewed.  Constitutional:      Appearance: Normal appearance. He is well-developed.  HENT:     Head: Normocephalic.     Nose: Nose normal.  Eyes:     Pupils: Pupils are equal, round, and reactive to light.  Neck:     Thyroid: No thyroid mass or thyromegaly.     Vascular: No carotid bruit or JVD.     Trachea: Phonation normal.  Cardiovascular:     Rate and Rhythm: Normal rate and regular rhythm.  Pulmonary:     Effort: Pulmonary effort is normal. No respiratory distress.     Breath sounds: Normal breath sounds.  Abdominal:     General: Bowel sounds are normal.      Palpations: Abdomen is soft.     Tenderness: There is no abdominal tenderness.  Musculoskeletal:        General: Normal range of motion.     Cervical back: Normal range of motion and neck supple.  Lymphadenopathy:     Cervical: No cervical adenopathy.  Skin:    General: Skin is warm and dry.  Neurological:     Mental Status: He is alert and oriented to person, place, and time.  Psychiatric:        Behavior: Behavior normal.        Thought Content: Thought content normal.        Judgment: Judgment normal.    BP (!) 145/76   Pulse 69   Temp 97.6 F (36.4 C) (Temporal)   Resp 20   Ht 5' 11" (1.803 m)   Wt 211 lb (95.7 kg)   SpO2 98%   BMI 29.43 kg/m        Assessment & Plan:   Dean Mcconnell comes in today with chief complaint of Medical Management of Chronic Issues   Diagnosis and orders addressed:  1. Primary hypertension Low sodium diet - hydrochlorothiazide (HYDRODIURIL) 25 MG tablet; Take 1 tablet (25 mg total) by mouth daily.  Dispense: 90 tablet; Refill: 1 - losartan (COZAAR) 100 MG tablet; Take 1 tablet (100 mg total) by mouth daily.  Dispense: 90 tablet; Refill: 1 - CBC with Differential/Platelet - CMP14+EGFR  2. Mixed hyperlipidemia Low fat diet - atorvastatin (LIPITOR) 40 MG tablet; Take 1 tablet (40 mg total) by mouth daily.  Dispense: 90 tablet; Refill: 1 - Lipid panel  3. Major depressive disorder with single episode, in full remission [facility]) Stress management  4. Smoker Continue to cut back on smoking  5. BMI 29.0-29.9,adult Discussed diet and exercise for person with BMI >25 Will recheck weight in 3-6 months  6. Hypokalemia Labs pending - potassium chloride SA (KLOR-CON) 20 MEQ tablet; Take 1 tablet (20 mEq total) by mouth daily.  Dispense: 90 tablet; Refill: 1   Labs pending Health Maintenance reviewed Diet and exercise encouraged  Follow up plan: 4month   Mary-Margaret MHassell Done FNP

## 2021-01-10 NOTE — Patient Instructions (Signed)
Managing the Challenge of Quitting Smoking Quitting smoking is a physical and mental challenge. You will face cravings, withdrawal symptoms, and temptation. Before quitting, work with your health care provider to make a plan that can help you manage quitting. Preparation canhelp you quit and keep you from giving in. How to manage lifestyle changes Managing stress Stress can make you want to smoke, and wanting to smoke may cause stress. It is important to find ways to manage your stress. You might try some of the following: Practice relaxation techniques. Breathe slowly and deeply, in through your nose and out through your mouth. Listen to music. Soak in a bath or take a shower. Imagine a peaceful place or vacation. Get some support. Talk with family or friends about your stress. Join a support group. Talk with a counselor or therapist. Get some physical activity. Go for a walk, run, or bike ride. Play a favorite sport. Practice yoga.  Medicines Talk with your health care provider about medicines that might help you dealwith cravings and make quitting easier for you. Relationships Social situations can be difficult when you are quitting smoking. To manage this, you can: Avoid parties and other social situations where people might be smoking. Avoid alcohol. Leave right away if you have the urge to smoke. Explain to your family and friends that you are quitting smoking. Ask for support and let them know you might be a bit grumpy. Plan activities where smoking is not an option. General instructions Be aware that many people gain weight after they quit smoking. However, not everyone does. To keep from gaining weight, have a plan in place before you quit and stick to the plan after you quit. Your plan should include: Having healthy snacks. When you have a craving, it may help to: Eat popcorn, carrots, celery, or other cut vegetables. Chew sugar-free gum. Changing how you eat. Eat small  portion sizes at meals. Eat 4-6 small meals throughout the day instead of 1-2 large meals a day. Be mindful when you eat. Do not watch television or do other things that might distract you as you eat. Exercising regularly. Make time to exercise each day. If you do not have time for a long workout, do short bouts of exercise for 5-10 minutes several times a day. Do some form of strengthening exercise, such as weight lifting. Do some exercise that gets your heart beating and causes you to breathe deeply, such as walking fast, running, swimming, or biking. This is very important. Drinking plenty of water or other low-calorie or no-calorie drinks. Drink 6-8 glasses of water daily.  How to recognize withdrawal symptoms Your body and mind may experience discomfort as you try to get used to not having nicotine in your system. These effects are called withdrawal symptoms. They may include: Feeling hungrier than normal. Having trouble concentrating. Feeling irritable or restless. Having trouble sleeping. Feeling depressed. Craving a cigarette. To manage withdrawal symptoms: Avoid places, people, and activities that trigger your cravings. Remember why you want to quit. Get plenty of sleep. Avoid coffee and other caffeinated drinks. These may worsen some of your symptoms. These symptoms may surprise you. But be assured that they are normal to havewhen quitting smoking. How to manage cravings Come up with a plan for how to deal with your cravings. The plan should include the following: A definition of the specific situation you want to deal with. An alternative action you will take. A clear idea for how this action will help. The   name of someone who might help you with this. Cravings usually last for 5-10 minutes. Consider taking the following actions to help you with your plan to deal with cravings: Keep your mouth busy. Chew sugar-free gum. Suck on hard candies or a straw. Brush your  teeth. Keep your hands and body busy. Change to a different activity right away. Squeeze or play with a ball. Do an activity or a hobby, such as making bead jewelry, practicing needlepoint, or working with wood. Mix up your normal routine. Take a short exercise break. Go for a quick walk or run up and down stairs. Focus on doing something kind or helpful for someone else. Call a friend or family member to talk during a craving. Join a support group. Contact a quitline. Where to find support To get help or find a support group: Call the National Cancer Institute's Smoking Quitline: 1-800-QUIT NOW (784-8669) Visit the website of the Substance Abuse and Mental Health Services Administration: www.samhsa.gov Text QUIT to SmokefreeTXT: 478848 Where to find more information Visit these websites to find more information on quitting smoking: National Cancer Institute: www.smokefree.gov American Lung Association: www.lung.org American Cancer Society: www.cancer.org Centers for Disease Control and Prevention: www.cdc.gov American Heart Association: www.heart.org Contact a health care provider if: You want to change your plan for quitting. The medicines you are taking are not helping. Your eating feels out of control or you cannot sleep. Get help right away if: You feel depressed or become very anxious. Summary Quitting smoking is a physical and mental challenge. You will face cravings, withdrawal symptoms, and temptation to smoke again. Preparation can help you as you go through these challenges. Try different techniques to manage stress, handle social situations, and prevent weight gain. You can deal with cravings by keeping your mouth busy (such as by chewing gum), keeping your hands and body busy, calling family or friends, or contacting a quitline for people who want to quit smoking. You can deal with withdrawal symptoms by avoiding places where people smoke, getting plenty of rest, and  avoiding drinks with caffeine. This information is not intended to replace advice given to you by your health care provider. Make sure you discuss any questions you have with your healthcare provider. Document Revised: 05/09/2019 Document Reviewed: 05/09/2019 Elsevier Patient Education  2022 [employer] Inc.  

## 2021-01-11 LAB — LIPID PANEL
Chol/HDL Ratio: 2.5 ratio (ref 0.0–5.0)
Cholesterol, Total: 114 mg/dL (ref 100–199)
HDL: 45 mg/dL (ref >39)
LDL Chol Calc (NIH): 54 mg/dL (ref 0–99)
Triglycerides: 72 mg/dL (ref 0–149)
VLDL Cholesterol Cal: 15 mg/dL (ref 5–40)

## 2021-01-11 LAB — CBC WITH DIFFERENTIAL/PLATELET
Basophils Absolute: 0 10*3/uL (ref 0.0–0.2)
Basos: 1 %
EOS (ABSOLUTE): 0.1 10*3/uL (ref 0.0–0.4)
Eos: 1 %
Hematocrit: 45.4 % (ref 37.5–51.0)
Hemoglobin: 15.3 g/dL (ref 13.0–17.7)
Immature Grans (Abs): 0 10*3/uL (ref 0.0–0.1)
Immature Granulocytes: 0 %
Lymphocytes Absolute: 2.4 10*3/uL (ref 0.7–3.1)
Lymphs: 31 %
MCH: 31.5 pg (ref 26.6–33.0)
MCHC: 33.7 g/dL (ref 31.5–35.7)
MCV: 93 fL (ref 79–97)
Monocytes Absolute: 0.7 10*3/uL (ref 0.1–0.9)
Monocytes: 10 %
Neutrophils Absolute: 4.5 10*3/uL (ref 1.4–7.0)
Neutrophils: 57 %
Platelets: 168 10*3/uL (ref 150–450)
RBC: 4.86 x10E6/uL (ref 4.14–5.80)
RDW: 13.5 % (ref 11.6–15.4)
WBC: 7.7 10*3/uL (ref 3.4–10.8)

## 2021-01-11 LAB — CMP14+EGFR
ALT: 7 IU/L (ref 0–44)
AST: 10 IU/L (ref 0–40)
Albumin/Globulin Ratio: 1.8 (ref 1.2–2.2)
Albumin: 4.2 g/dL (ref 3.7–4.7)
Alkaline Phosphatase: 134 IU/L — ABNORMAL HIGH (ref 44–121)
BUN/Creatinine Ratio: 9 — ABNORMAL LOW (ref 10–24)
BUN: 17 mg/dL (ref 8–27)
Bilirubin Total: 0.6 mg/dL (ref 0.0–1.2)
CO2: 22 mmol/L (ref 20–29)
Calcium: 9.4 mg/dL (ref 8.6–10.2)
Chloride: 106 mmol/L (ref 96–106)
Creatinine, Ser: 1.84 mg/dL — ABNORMAL HIGH (ref 0.76–1.27)
Globulin, Total: 2.4 g/dL (ref 1.5–4.5)
Glucose: 93 mg/dL (ref 65–99)
Potassium: 4.6 mmol/L (ref 3.5–5.2)
Sodium: 143 mmol/L (ref 134–144)
Total Protein: 6.6 g/dL (ref 6.0–8.5)
eGFR: 38 mL/min/{1.73_m2} — ABNORMAL LOW (ref >59)

## 2021-04-14 ENCOUNTER — Ambulatory Visit (INDEPENDENT_AMBULATORY_CARE_PROVIDER_SITE_OTHER): Payer: Medicare Other | Admitting: Nurse Practitioner

## 2021-04-14 ENCOUNTER — Encounter: Payer: Self-pay | Admitting: Nurse Practitioner

## 2021-04-14 ENCOUNTER — Other Ambulatory Visit: Payer: Self-pay

## 2021-04-14 VITALS — BP 128/64 | HR 77 | Temp 97.8°F | Resp 20 | Ht 71.0 in | Wt 205.0 lb

## 2021-04-14 DIAGNOSIS — I10 Essential (primary) hypertension: Secondary | ICD-10-CM

## 2021-04-14 DIAGNOSIS — E782 Mixed hyperlipidemia: Secondary | ICD-10-CM

## 2021-04-14 DIAGNOSIS — F17200 Nicotine dependence, unspecified, uncomplicated: Secondary | ICD-10-CM

## 2021-04-14 DIAGNOSIS — E876 Hypokalemia: Secondary | ICD-10-CM | POA: Diagnosis not present

## 2021-04-14 DIAGNOSIS — Z6828 Body mass index (BMI) 28.0-28.9, adult: Secondary | ICD-10-CM

## 2021-04-14 DIAGNOSIS — F325 Major depressive disorder, single episode, in full remission: Secondary | ICD-10-CM | POA: Diagnosis not present

## 2021-04-14 MED ORDER — LOSARTAN POTASSIUM 100 MG PO TABS: 100.0000 mg | ORAL_TABLET | Freq: Every day | ORAL | 1 refills | Status: DC

## 2021-04-14 MED ORDER — HYDROCHLOROTHIAZIDE 25 MG PO TABS: 25.0000 mg | ORAL_TABLET | Freq: Every day | ORAL | 1 refills | Status: DC

## 2021-04-14 MED ORDER — POTASSIUM CHLORIDE CRYS ER 20 MEQ PO TBCR
20.0000 meq | EXTENDED_RELEASE_TABLET | Freq: Every day | ORAL | 1 refills | Status: DC
Start: 2021-04-14 — End: 2021-07-15

## 2021-04-14 MED ORDER — ATORVASTATIN CALCIUM 40 MG PO TABS: 40.0000 mg | ORAL_TABLET | Freq: Every day | ORAL | 1 refills | Status: DC

## 2021-04-14 NOTE — Patient Instructions (Signed)
Managing the Challenge of Quitting Smoking Quitting smoking is a physical and mental challenge. You will face cravings, withdrawal symptoms, and temptation. Before quitting, work with your health care provider to make a plan that can help you manage quitting. Preparation canhelp you quit and keep you from giving in. How to manage lifestyle changes Managing stress Stress can make you want to smoke, and wanting to smoke may cause stress. It is important to find ways to manage your stress. You might try some of the following: Practice relaxation techniques. Breathe slowly and deeply, in through your nose and out through your mouth. Listen to music. Soak in a bath or take a shower. Imagine a peaceful place or vacation. Get some support. Talk with family or friends about your stress. Join a support group. Talk with a counselor or therapist. Get some physical activity. Go for a walk, run, or bike ride. Play a favorite sport. Practice yoga.  Medicines Talk with your health care provider about medicines that might help you dealwith cravings and make quitting easier for you. Relationships Social situations can be difficult when you are quitting smoking. To manage this, you can: Avoid parties and other social situations where people might be smoking. Avoid alcohol. Leave right away if you have the urge to smoke. Explain to your family and friends that you are quitting smoking. Ask for support and let them know you might be a bit grumpy. Plan activities where smoking is not an option. General instructions Be aware that many people gain weight after they quit smoking. However, not everyone does. To keep from gaining weight, have a plan in place before you quit and stick to the plan after you quit. Your plan should include: Having healthy snacks. When you have a craving, it may help to: Eat popcorn, carrots, celery, or other cut vegetables. Chew sugar-free gum. Changing how you eat. Eat small  portion sizes at meals. Eat 4-6 small meals throughout the day instead of 1-2 large meals a day. Be mindful when you eat. Do not watch television or do other things that might distract you as you eat. Exercising regularly. Make time to exercise each day. If you do not have time for a long workout, do short bouts of exercise for 5-10 minutes several times a day. Do some form of strengthening exercise, such as weight lifting. Do some exercise that gets your heart beating and causes you to breathe deeply, such as walking fast, running, swimming, or biking. This is very important. Drinking plenty of water or other low-calorie or no-calorie drinks. Drink 6-8 glasses of water daily.  How to recognize withdrawal symptoms Your body and mind may experience discomfort as you try to get used to not having nicotine in your system. These effects are called withdrawal symptoms. They may include: Feeling hungrier than normal. Having trouble concentrating. Feeling irritable or restless. Having trouble sleeping. Feeling depressed. Craving a cigarette. To manage withdrawal symptoms: Avoid places, people, and activities that trigger your cravings. Remember why you want to quit. Get plenty of sleep. Avoid coffee and other caffeinated drinks. These may worsen some of your symptoms. These symptoms may surprise you. But be assured that they are normal to havewhen quitting smoking. How to manage cravings Come up with a plan for how to deal with your cravings. The plan should include the following: A definition of the specific situation you want to deal with. An alternative action you will take. A clear idea for how this action will help. The   name of someone who might help you with this. Cravings usually last for 5-10 minutes. Consider taking the following actions to help you with your plan to deal with cravings: Keep your mouth busy. Chew sugar-free gum. Suck on hard candies or a straw. Brush your  teeth. Keep your hands and body busy. Change to a different activity right away. Squeeze or play with a ball. Do an activity or a hobby, such as making bead jewelry, practicing needlepoint, or working with wood. Mix up your normal routine. Take a short exercise break. Go for a quick walk or run up and down stairs. Focus on doing something kind or helpful for someone else. Call a friend or family member to talk during a craving. Join a support group. Contact a quitline. Where to find support To get help or find a support group: Call the National Cancer Institute's Smoking Quitline: 1-800-QUIT NOW (784-8669) Visit the website of the Substance Abuse and Mental Health Services Administration: www.samhsa.gov Text QUIT to SmokefreeTXT: 478848 Where to find more information Visit these websites to find more information on quitting smoking: National Cancer Institute: www.smokefree.gov American Lung Association: www.lung.org American Cancer Society: www.cancer.org Centers for Disease Control and Prevention: www.cdc.gov American Heart Association: www.heart.org Contact a health care provider if: You want to change your plan for quitting. The medicines you are taking are not helping. Your eating feels out of control or you cannot sleep. Get help right away if: You feel depressed or become very anxious. Summary Quitting smoking is a physical and mental challenge. You will face cravings, withdrawal symptoms, and temptation to smoke again. Preparation can help you as you go through these challenges. Try different techniques to manage stress, handle social situations, and prevent weight gain. You can deal with cravings by keeping your mouth busy (such as by chewing gum), keeping your hands and body busy, calling family or friends, or contacting a quitline for people who want to quit smoking. You can deal with withdrawal symptoms by avoiding places where people smoke, getting plenty of rest, and  avoiding drinks with caffeine. This information is not intended to replace advice given to you by your health care provider. Make sure you discuss any questions you have with your healthcare provider. Document Revised: 05/09/2019 Document Reviewed: 05/09/2019 Elsevier Patient Education  2022 [employer] Inc.  

## 2021-04-14 NOTE — Progress Notes (Signed)
Subjective:    Patient ID: Dean Mcconnell, male    DOB: 1945-10-20, 75 y.o.   MRN: [id number]   Chief Complaint: medical management of chronic issues     HPI:  1. Primary hypertension No c/o chest pain, sob or headache. Does not check blood pressure at home. BP Readings from Last 3 Encounters:  04/14/21 128/64  01/10/21 (!) 145/76  10/01/20 137/73     2. Mixed hyperlipidemia Doe swatch diet and exercises daily Lab Results  Component Value Date   CHOL 114 01/10/2021   HDL 45 01/10/2021   LDLCALC 54 01/10/2021   TRIG 72 01/10/2021   CHOLHDL 2.5 01/10/2021     3. Major depressive disorder with single episode, in full remission [facility]) Doing well on no meds right now. Depression screen [facility] 2/9 04/14/2021 01/10/2021 10/01/2020  Decreased Interest 0 0 0  Down, Depressed, Hopeless 0 0 0  PHQ - 2 Score 0 0 0  Altered sleeping 0 0 0  Tired, decreased energy 0 0 0  Change in appetite 0 0 0  Feeling bad or failure about yourself  0 0 0  Trouble concentrating 0 0 0  Moving slowly or fidgety/restless 0 0 0  Suicidal thoughts 0 0 0  PHQ-9 Score 0 0 0  Difficult doing work/chores Not difficult at all Not difficult at all Not difficult at all  Some recent data might be hidden     4. Smoker Smokes about 1/2 pack a day.   5. BMI 28.0-28.9,adult No recent weight changes Wt Readings from Last 3 Encounters:  04/14/21 205 lb (93 kg)  01/10/21 211 lb (95.7 kg)  10/01/20 218 lb (98.9 kg)   BMI Readings from Last 3 Encounters:  04/14/21 28.59 kg/m  01/10/21 29.43 kg/m  10/01/20 30.40 kg/m       Outpatient Encounter Medications as of 04/14/2021  Medication Sig   atorvastatin (LIPITOR) 40 MG tablet Take 1 tablet (40 mg total) by mouth daily.   cetirizine (ZYRTEC) 10 MG tablet Take 1 tablet (10 mg total) by mouth daily.   fluticasone (FLONASE) 50 MCG/ACT nasal spray Place 2 sprays into both nostrils daily.   hydrochlorothiazide (HYDRODIURIL) 25 MG tablet Take 1 tablet (25 mg  total) by mouth daily.   losartan (COZAAR) 100 MG tablet Take 1 tablet (100 mg total) by mouth daily.   potassium chloride SA (KLOR-CON) 20 MEQ tablet Take 1 tablet (20 mEq total) by mouth daily.   sildenafil (VIAGRA) 100 MG tablet 1 po prn   No facility-administered encounter medications on file as of 04/14/2021.    Past Surgical History:  Procedure Laterality Date   SKIN CANCER EXCISION     nose    Family History  Problem Relation Age of Onset   Hypertension Father     New complaints: None today  Social history: Lives by hisself  Controlled substance contract: n/a      Review of Systems  Constitutional:  Negative for diaphoresis.  Eyes:  Negative for pain.  Respiratory:  Negative for shortness of breath.   Cardiovascular:  Negative for chest pain, palpitations and leg swelling.  Gastrointestinal:  Negative for abdominal pain.  Endocrine: Negative for polydipsia.  Skin:  Negative for rash.  Neurological:  Negative for dizziness, weakness and headaches.  Hematological:  Does not bruise/bleed easily.  All other systems reviewed and are negative.     Objective:   Physical Exam Vitals and nursing note reviewed.  Constitutional:      Appearance:  Normal appearance. He is well-developed.  HENT:     Head: Normocephalic.     Nose: Nose normal.  Eyes:     Pupils: Pupils are equal, round, and reactive to light.  Neck:     Thyroid: No thyroid mass or thyromegaly.     Vascular: No carotid bruit or JVD.     Trachea: Phonation normal.  Cardiovascular:     Rate and Rhythm: Normal rate and regular rhythm.  Pulmonary:     Effort: Pulmonary effort is normal. No respiratory distress.     Breath sounds: Normal breath sounds.  Abdominal:     General: Bowel sounds are normal.     Palpations: Abdomen is soft.     Tenderness: There is no abdominal tenderness.  Musculoskeletal:        General: Normal range of motion.     Cervical back: Normal range of motion and neck  supple.  Lymphadenopathy:     Cervical: No cervical adenopathy.  Skin:    General: Skin is warm and dry.  Neurological:     Mental Status: He is alert and oriented to person, place, and time.  Psychiatric:        Behavior: Behavior normal.        Thought Content: Thought content normal.        Judgment: Judgment normal.    BP 128/64   Pulse 77   Temp 97.8 F (36.6 C) (Temporal)   Resp 20   Ht _0  (1.803 m)   Wt 205 lb (93 kg)   SpO2 98%   BMI 28.59 kg/m        Assessment & Plan:   Gradie Ohm comes in today with chief complaint of No chief complaint on file.   Diagnosis and orders addressed:  1. Primary hypertension Low sodium diet - hydrochlorothiazide (HYDRODIURIL) 25 MG tablet; Take 1 tablet (25 mg total) by mouth daily.  Dispense: 90 tablet; Refill: 1 - losartan (COZAAR) 100 MG tablet; Take 1 tablet (100 mg total) by mouth daily.  Dispense: 90 tablet; Refill: 1 - CBC with Differential/Platelet - CMP14+EGFR  2. Mixed hyperlipidemia Low fat diet - atorvastatin (LIPITOR) 40 MG tablet; Take 1 tablet (40 mg total) by mouth daily.  Dispense: 90 tablet; Refill: 1 - Lipid panel  3. Major depressive disorder with single episode, in full remission [facility]) Stress management  4. Smoker Smoking cessation encouraged  5. BMI 28.0-28.9,adult Discussed diet and exercise for person with BMI >25 Will recheck weight in 3-6 months   6. Hypokalemia Labs pending - potassium chloride SA (KLOR-CON) 20 MEQ tablet; Take 1 tablet (20 mEq total) by mouth daily.  Dispense: 90 tablet; Refill: 1   Labs pending Health Maintenance reviewed Diet and exercise encouraged  Follow up plan: 3 months   Mary-Margaret Hassell Done, FNP

## 2021-04-15 LAB — CMP14+EGFR
ALT: 9 IU/L (ref 0–44)
AST: 14 IU/L (ref 0–40)
Albumin/Globulin Ratio: 1.8 (ref 1.2–2.2)
Albumin: 4.3 g/dL (ref 3.7–4.7)
Alkaline Phosphatase: 129 IU/L — ABNORMAL HIGH (ref 44–121)
BUN/Creatinine Ratio: 10 (ref 10–24)
BUN: 21 mg/dL (ref 8–27)
Bilirubin Total: 0.5 mg/dL (ref 0.0–1.2)
CO2: 26 mmol/L (ref 20–29)
Calcium: 9.6 mg/dL (ref 8.6–10.2)
Chloride: 100 mmol/L (ref 96–106)
Creatinine, Ser: 2.01 mg/dL — ABNORMAL HIGH (ref 0.76–1.27)
Globulin, Total: 2.4 g/dL (ref 1.5–4.5)
Glucose: 99 mg/dL (ref 65–99)
Potassium: 4.3 mmol/L (ref 3.5–5.2)
Sodium: 142 mmol/L (ref 134–144)
Total Protein: 6.7 g/dL (ref 6.0–8.5)
eGFR: 34 mL/min/{1.73_m2} — ABNORMAL LOW (ref >59)

## 2021-04-15 LAB — CBC WITH DIFFERENTIAL/PLATELET
Basophils Absolute: 0 10*3/uL (ref 0.0–0.2)
Basos: 0 %
EOS (ABSOLUTE): 0.1 10*3/uL (ref 0.0–0.4)
Eos: 1 %
Hematocrit: 47.2 % (ref 37.5–51.0)
Hemoglobin: 15.9 g/dL (ref 13.0–17.7)
Immature Grans (Abs): 0 10*3/uL (ref 0.0–0.1)
Immature Granulocytes: 0 %
Lymphocytes Absolute: 3.2 10*3/uL — ABNORMAL HIGH (ref 0.7–3.1)
Lymphs: 36 %
MCH: 31 pg (ref 26.6–33.0)
MCHC: 33.7 g/dL (ref 31.5–35.7)
MCV: 92 fL (ref 79–97)
Monocytes Absolute: 1 10*3/uL — ABNORMAL HIGH (ref 0.1–0.9)
Monocytes: 11 %
Neutrophils Absolute: 4.6 10*3/uL (ref 1.4–7.0)
Neutrophils: 52 %
Platelets: 157 10*3/uL (ref 150–450)
RBC: 5.13 x10E6/uL (ref 4.14–5.80)
RDW: 13.1 % (ref 11.6–15.4)
WBC: 8.9 10*3/uL (ref 3.4–10.8)

## 2021-04-15 LAB — LIPID PANEL
Chol/HDL Ratio: 2.6 ratio (ref 0.0–5.0)
Cholesterol, Total: 136 mg/dL (ref 100–199)
HDL: 53 mg/dL (ref >39)
LDL Chol Calc (NIH): 69 mg/dL (ref 0–99)
Triglycerides: 71 mg/dL (ref 0–149)
VLDL Cholesterol Cal: 14 mg/dL (ref 5–40)

## 2021-04-17 ENCOUNTER — Telehealth: Payer: Self-pay | Admitting: Nurse Practitioner

## 2021-04-17 NOTE — Telephone Encounter (Signed)
Patient aware and verbalized understanding. He will not see nephrologist

## 2021-06-09 ENCOUNTER — Telehealth: Payer: Self-pay | Admitting: Nurse Practitioner

## 2021-06-09 NOTE — Telephone Encounter (Signed)
Left message for patient to call back and schedule Medicare Annual Wellness Visit (AWV) to be completed by video or phone.   Last AWV: 04/16/2020  Please schedule at anytime with [facility] Health Advisor.  45 minute appointment  Any questions, please contact me at [phone]

## 2021-06-11 ENCOUNTER — Ambulatory Visit (INDEPENDENT_AMBULATORY_CARE_PROVIDER_SITE_OTHER): Payer: Medicare Other

## 2021-06-11 VITALS — Wt 205.0 lb

## 2021-06-11 DIAGNOSIS — Z0000 Encounter for general adult medical examination without abnormal findings: Secondary | ICD-10-CM | POA: Diagnosis not present

## 2021-06-11 NOTE — Progress Notes (Signed)
Subjective:   Dean Mcconnell is a 75 y.o. male who presents for Medicare Annual/Subsequent preventive examination. Virtual Visit via Telephone Note  I connected with  Dean Mcconnell on 06/11/21 at 11:15 AM EST by telephone and verified that I am speaking with the correct person using two identifiers.  Location: Patient: Home Provider: [facility] Persons participating in the virtual visit: patient/Nurse Health Advisor   I discussed the limitations, risks, security and privacy concerns of performing an evaluation and management service by telephone and the availability of in person appointments. The patient expressed understanding and agreed to proceed.  Interactive audio and video telecommunications were attempted between this nurse and patient, however failed, due to patient having technical difficulties OR patient did not have access to video capability.  We continued and completed visit with audio only.  Some vital signs may be absent or patient reported.   Chriss Driver, LPN  Review of Systems     Cardiac Risk Factors include: advanced age (>48mn, >>71women);hypertension;dyslipidemia     Objective:    Today's Vitals   06/11/21 1132  Weight: 205 lb (93 kg)   Body mass index is 28.59 kg/m.  Advanced Directives 06/11/2021 04/16/2020 04/14/2019 04/08/2018 01/20/2017 11/29/2014 09/10/2014  Does Patient Have a Medical Advance Directive? No No Yes Yes No No No  Type of Advance Directive - -Public librarianLiving will [place]oss LandingLiving will - - -  Does patient want to make changes to medical advance directive? - - No - Patient declined No - Patient declined Yes (MAU/Ambulatory/Procedural Areas - Information given) - -  Copy of [place] Chart? - - No - copy requested No - copy requested - - -  Would patient like information on creating a medical advance directive? No - Patient declined No - Patient declined - - Yes  (MAU/Ambulatory/Procedural Areas - Information given) (No Data) Yes - Educational materials given    Current Medications (verified) Outpatient Encounter Medications as of 06/11/2021  Medication Sig   atorvastatin (LIPITOR) 40 MG tablet Take 1 tablet (40 mg total) by mouth daily.   cetirizine (ZYRTEC) 10 MG tablet Take 1 tablet (10 mg total) by mouth daily.   fluticasone (FLONASE) 50 MCG/ACT nasal spray Place 2 sprays into both nostrils daily.   hydrochlorothiazide (HYDRODIURIL) 25 MG tablet Take 1 tablet (25 mg total) by mouth daily.   losartan (COZAAR) 100 MG tablet Take 1 tablet (100 mg total) by mouth daily.   potassium chloride SA (KLOR-CON) 20 MEQ tablet Take 1 tablet (20 mEq total) by mouth daily.   sildenafil (VIAGRA) 100 MG tablet 1 po prn   No facility-administered encounter medications on file as of 06/11/2021.    Allergies (verified) Penicillins   History: Past Medical History:  Diagnosis Date   Cancer ([place]    melanoma on nose   Hyperlipidemia    Hypertension    Past Surgical History:  Procedure Laterality Date   SKIN CANCER EXCISION     nose   Family History  Problem Relation Age of Onset   Hypertension Father    Social History   Socioeconomic History   Marital status: Widowed    Spouse name: Not on file   Number of children: 1   Years of education: GED   Highest education level: GED or equivalent  Occupational History   Occupation: Retired     Comment: [employer]  Tobacco Use   Smoking status: Light Smoker  Packs/day: 0.25    Years: 30.00    Pack years: 7.50    Types: Cigarettes   Smokeless tobacco: Former    Types: Chew    Quit date: 04/13/2009  Vaping Use   Vaping Use: Never used  Substance and Sexual Activity   Alcohol use: No   Drug use: No   Sexual activity: Not Currently  Other Topics Concern   Not on file  Social History Narrative   1 daughter, 2 step-son and 1 step-daughter   Social Determinants of Health    Financial Resource Strain: Low Risk    Difficulty of Paying Living Expenses: Not hard at all  Food Insecurity: No Food Insecurity   Worried About Charity fundraiser in the Last Year: Never true   Arboriculturist in the Last Year: Never true  Transportation Needs: No Transportation Needs   Lack of Transportation (Medical): No   Lack of Transportation (Non-Medical): No  Physical Activity: Sufficiently Active   Days of Exercise per Week: 5 days   Minutes of Exercise per Session: 30 min  Stress: No Stress Concern Present   Feeling of Stress : Not at all  Social Connections: Socially Isolated   Frequency of Communication with Friends and Family: More than three times a week   Frequency of Social Gatherings with Friends and Family: More than three times a week   Attends Religious Services: Never   Marine scientist or Organizations: No   Attends Archivist Meetings: Never   Marital Status: Widowed    Tobacco Counseling Ready to quit: Not Answered Counseling given: Not Answered   Clinical Intake:  Pre-visit preparation completed: Yes  Pain : No/denies pain     BMI - recorded: 28.59 Nutritional Status: BMI 25 -29 Overweight Nutritional Risks: None Diabetes: No  How often do you need to have someone help you when you read instructions, pamphlets, or other written materials from your doctor or pharmacy?: 1 - Never  Diabetic?No  Interpreter Needed?: No  Information entered by :: Traci Sermon, LPN   Activities of Daily Living In your present state of health, do you have any difficulty performing the following activities: 06/11/2021  Hearing? N  Vision? N  Difficulty concentrating or making decisions? N  Walking or climbing stairs? N  Dressing or bathing? N  Doing errands, shopping? N  Preparing Food and eating ? N  Using the Toilet? N  In the past six months, have you accidently leaked urine? N  Do you have problems with loss of bowel control? N   Managing your Medications? N  Managing your Finances? N  Housekeeping or managing your Housekeeping? N  Some recent data might be hidden    Patient Care Team: Chevis Pretty, FNP as PCP - General (Nurse Practitioner) Harlen Labs, MD as Referring Physician (Optometry)  Indicate any recent Medical Services you may have received from other than [facility] providers in the past year (date may be approximate).     Assessment:   This is a routine wellness examination for Nikko.  Hearing/Vision screen Hearing Screening - Comments:: No hearing issues.  Vision Screening - Comments:: [facility] 01/2020  Dietary issues and exercise activities discussed: Current Exercise Habits: Home exercise routine, Type of exercise: walking;strength training/weights, Time (Minutes): 30, Frequency (Times/Week): 5, Weekly Exercise (Minutes/Week): 150, Intensity: Mild, Exercise limited by: cardiac condition(s)   Goals Addressed             This  Visit's Progress    DIET - EAT MORE FRUITS AND VEGETABLES   On track    DIET - INCREASE WATER INTAKE   On track    Try to drink 6-8 glasses of water daily.     Exercise 3x per week (30 min per time)   On track      Depression Screen PHQ 2/9 Scores 06/11/2021 04/14/2021 01/10/2021 10/01/2020 07/03/2020 04/16/2020 04/01/2020  PHQ - 2 Score 0 0 0 0 0 0 0  PHQ- 9 Score 0 0 0 0 0 - -    Fall Risk Fall Risk  06/11/2021 04/14/2021 01/10/2021 10/01/2020 07/03/2020  Falls in the past year? 0 0 0 0 0  Number falls in past yr: 0 - - - -  Injury with Fall? 0 - - - -  Risk for fall due to : Impaired vision - - - -  Follow up Falls prevention discussed - - - -  Comment - - - - -    FALL RISK PREVENTION PERTAINING TO THE HOME:  Any stairs in or around the home? No  If so, are there any without handrails? No  Home free of loose throw rugs in walkways, pet beds, electrical cords, etc? Yes  Adequate lighting in your home to reduce risk of falls? Yes   ASSISTIVE  DEVICES UTILIZED TO PREVENT FALLS:  Life alert? No  Use of a cane, walker or w/c? No  Grab bars in the bathroom? Yes  Shower chair or bench in shower? No  Elevated toilet seat or a handicapped toilet? Yes   TIMED UP AND GO:  Was the test performed? No .  Phone visit.  Cognitive Function: Normal cognitive status assessed by direct observation by this Nurse Health Advisor. No abnormalities found.   MMSE - Mini Mental State Exam 04/08/2018 01/20/2017 11/29/2014  Orientation to time _0 Orientation to Place _1 Registration _2 Attention/ Calculation _3 Recall _4 Language- name 2 objects _5 Language- repeat _6 Language- follow 3 step command _7 Language- read & follow direction _8 Write a sentence 1 0 1  Copy design 1 0 1  Total score _9 6CIT Screen 04/16/2020 04/14/2019  What Year? 0 points 0 points  What month? 0 points 0 points  What time? 0 points 0 points  Count back from 20 0 points 0 points  Months in reverse 2 points 0 points  Repeat phrase 2 points 2 points  Total Score 4 2    Immunizations Immunization History  Administered Date(s) Administered   Moderna Covid-19 Vaccine Bivalent Booster 60yr & up 05/06/2021   Moderna Sars-Covid-2 Vaccination 11/07/2019, 12/05/2019, 07/09/2020, 11/12/2020   Td 09/04/2009    TDAP status: Due, Education has been provided regarding the importance of this vaccine. Advised may receive this vaccine at local pharmacy or Health Dept. Aware to provide a copy of the vaccination record if obtained from local pharmacy or Health Dept. Verbalized acceptance and understanding.  Flu Vaccine status: Due, Education has been provided regarding the importance of this vaccine. Advised may receive this vaccine at local pharmacy or Health Dept. Aware to provide a copy of the vaccination record if obtained from local pharmacy or Health Dept. Verbalized acceptance and understanding.  Pneumococcal vaccine status:  Declined,  Education has been provided regarding the importance of this vaccine  but patient still declined. Advised may receive this vaccine at local pharmacy or Health Dept. Aware to provide a copy of the vaccination record if obtained from local pharmacy or Health Dept. Verbalized acceptance and understanding.   Covid-19 vaccine status: Completed vaccines  Qualifies for Shingles Vaccine? Yes   Zostavax completed No   Shingrix Completed?: No.    Education has been provided regarding the importance of this vaccine. Patient has been advised to call insurance company to determine out of pocket expense if they have not yet received this vaccine. Advised may also receive vaccine at local pharmacy or Health Dept. Verbalized acceptance and understanding.  Screening Tests Health Maintenance  Topic Date Due   Pneumonia Vaccine 50+ Years old (1 - PCV) Never done   Zoster Vaccines- Shingrix (1 of 2) 07/14/2021 (Originally 01/23/1997)   TETANUS/TDAP  10/01/2021 (Originally 09/05/2019)   Fecal DNA (Cologuard)  10/01/2021 (Originally 01/23/1997)   INFLUENZA VACCINE  10/31/2021 (Originally 03/03/2021)   COVID-19 Vaccine  Completed   Hepatitis C Screening  Completed   HPV VACCINES  Aged Out    Health Maintenance  Health Maintenance Due  Topic Date Due   Pneumonia Vaccine 24+ Years old (1 - PCV) Never done    Colorectal cancer screening: Type of screening: Cologuard. Completed Has kit but has not submitted yet. Repeat every 3 years  Lung Cancer Screening: (Low Dose CT Chest recommended if Age 15-80 years, 30 pack-year currently smoking OR have quit w/in 15years.) does qualify.   Lung Cancer Screening Referral: Declined.  Additional Screening:  Hepatitis C Screening: does qualify; Completed 06/28/2015  Vision Screening: Recommended annual ophthalmology exams for early detection of glaucoma and other disorders of the eye. Is the patient up to date with their annual eye exam?  Yes  Who is the provider  or what is the name of the office in which the patient attends annual eye exams? [facility] If pt is not established with a provider, would they like to be referred to a provider to establish care? No .   Dental Screening: Recommended annual dental exams for proper oral hygiene  Community Resource Referral / Chronic Care Management: CRR required this visit?  No   CCM required this visit?  No      Plan:     I have personally reviewed and noted the following in the patient's chart:   Medical and social history Use of alcohol, tobacco or illicit drugs  Current medications and supplements including opioid prescriptions. Patient is not currently taking opioid prescriptions. Functional ability and status Nutritional status Physical activity Advanced directives List of other physicians Hospitalizations, surgeries, and ER visits in previous 12 months Vitals Screenings to include cognitive, depression, and falls Referrals and appointments  In addition, I have reviewed and discussed with patient certain preventive protocols, quality metrics, and best practice recommendations. A written personalized care plan for preventive services as well as general preventive health recommendations were provided to patient.     Chriss Driver, LPN   [id number]   Nurse Notes: Discussed health maintenance and vaccines needed. Pt has Cologuard kit but has not submitted yet. Pt declined vaccines at this time but has completed all Covid vaccines and boosters.

## 2021-06-11 NOTE — Patient Instructions (Signed)
[name] , Thank you for taking time to come for your Medicare Wellness Visit. I appreciate your ongoing commitment to your health goals. Please review the following plan we discussed and let me know if I can assist you in the future.   Screening recommendations/referrals: Colonoscopy: Please complete Cologuard kt.   Recommended yearly ophthalmology/optometry visit for glaucoma screening and checkup Recommended yearly dental visit for hygiene and checkup  Vaccinations: Influenza vaccine: Declined.  Pneumococcal vaccine: Due Tdap vaccine: Due Repeat in 10 years  Shingles vaccine: Shingrix discussed. Please contact your pharmacy for coverage information.     Covid-19: Done 11/07/19, 12/05/19, 07/09/20, 11/12/20 and 05/06/2021.  Advanced directives: Advance directive discussed with you today. Even though you declined this today, please call our office should you change your mind, and we can give you the proper paperwork for you to fill out.   Conditions/risks identified: Aim for 30 minutes of exercise or brisk walking each day, drink 6-8 glasses of water and eat lots of fruits and vegetables. KEEP UP THE GOOD WORK!!!  Next appointment: Follow up in one year for your annual wellness visit. 2023.  Preventive Care 32 Years and Older, Male  Preventive care refers to lifestyle choices and visits with your health care provider that can promote health and wellness. What does preventive care include? A yearly physical exam. This is also called an annual well check. Dental exams once or twice a year. Routine eye exams. Ask your health care provider how often you should have your eyes checked. Personal lifestyle choices, including: Daily care of your teeth and gums. Regular physical activity. Eating a healthy diet. Avoiding tobacco and drug use. Limiting alcohol use. Practicing safe sex. Taking low doses of aspirin every day. Taking vitamin and mineral supplements as recommended by your  health care provider. What happens during an annual well check? The services and screenings done by your health care provider during your annual well check will depend on your age, overall health, lifestyle risk factors, and family history of disease. Counseling  Your health care provider may ask you questions about your: Alcohol use. Tobacco use. Drug use. Emotional well-being. Home and relationship well-being. Sexual activity. Eating habits. History of falls. Memory and ability to understand (cognition). Work and work Statistician. Screening  You may have the following tests or measurements: Height, weight, and BMI. Blood pressure. Lipid and cholesterol levels. These may be checked every 5 years, or more frequently if you are over 65 years old. Skin check. Lung cancer screening. You may have this screening every year starting at age 74 if you have a 30-pack-year history of smoking and currently smoke or have quit within the past 15 years. Fecal occult blood test (FOBT) of the stool. You may have this test every year starting at age 74. Flexible sigmoidoscopy or colonoscopy. You may have a sigmoidoscopy every 5 years or a colonoscopy every 10 years starting at age 74. Prostate cancer screening. Recommendations will vary depending on your family history and other risks. Hepatitis C blood test. Hepatitis B blood test. Sexually transmitted disease (STD) testing. Diabetes screening. This is done by checking your blood sugar (glucose) after you have not eaten for a while (fasting). You may have this done every 1-3 years. Abdominal aortic aneurysm (AAA) screening. You may need this if you are a current or former smoker. Osteoporosis. You may be screened starting at age 74 if you are at high risk. Talk with your health care provider about your test results, treatment  options, and if necessary, the need for more tests. Vaccines  Your health care provider may recommend certain vaccines, such  as: Influenza vaccine. This is recommended every year. Tetanus, diphtheria, and acellular pertussis (Tdap, Td) vaccine. You may need a Td booster every 10 years. Zoster vaccine. You may need this after age 74. Pneumococcal 13-valent conjugate (PCV13) vaccine. One dose is recommended after age 74. Pneumococcal polysaccharide (PPSV23) vaccine. One dose is recommended after age 50. Talk to your health care provider about which screenings and vaccines you need and how often you need them. This information is not intended to replace advice given to you by your health care provider. Make sure you discuss any questions you have with your health care provider. Document Released: 08/16/2015 Document Revised: 04/08/2016 Document Reviewed: 05/21/2015 Elsevier Interactive Patient Education  2017 [place] Prevention in the Home Falls can cause injuries. They can happen to people of all ages. There are many things you can do to make your home safe and to help prevent falls. What can I do on the outside of my home? Regularly fix the edges of walkways and driveways and fix any cracks. Remove anything that might make you trip as you walk through a door, such as a raised step or threshold. Trim any bushes or trees on the path to your home. Use bright outdoor lighting. Clear any walking paths of anything that might make someone trip, such as rocks or tools. Regularly check to see if handrails are loose or broken. Make sure that both sides of any steps have handrails. Any raised decks and porches should have guardrails on the edges. Have any leaves, snow, or ice cleared regularly. Use sand or salt on walking paths during winter. Clean up any spills in your garage right away. This includes oil or grease spills. What can I do in the bathroom? Use night lights. Install grab bars by the toilet and in the tub and shower. Do not use towel bars as grab bars. Use non-skid mats or decals in the tub or  shower. If you need to sit down in the shower, use a plastic, non-slip stool. Keep the floor dry. Clean up any water that spills on the floor as soon as it happens. Remove soap buildup in the tub or shower regularly. Attach bath mats securely with double-sided non-slip rug tape. Do not have throw rugs and other things on the floor that can make you trip. What can I do in the bedroom? Use night lights. Make sure that you have a light by your bed that is easy to reach. Do not use any sheets or blankets that are too big for your bed. They should not hang down onto the floor. Have a firm chair that has side arms. You can use this for support while you get dressed. Do not have throw rugs and other things on the floor that can make you trip. What can I do in the kitchen? Clean up any spills right away. Avoid walking on wet floors. Keep items that you use a lot in easy-to-reach places. If you need to reach something above you, use a strong step stool that has a grab bar. Keep electrical cords out of the way. Do not use floor polish or wax that makes floors slippery. If you must use wax, use non-skid floor wax. Do not have throw rugs and other things on the floor that can make you trip. What can I do with my stairs? Do not  leave any items on the stairs. Make sure that there are handrails on both sides of the stairs and use them. Fix handrails that are broken or loose. Make sure that handrails are as long as the stairways. Check any carpeting to make sure that it is firmly attached to the stairs. Fix any carpet that is loose or worn. Avoid having throw rugs at the top or bottom of the stairs. If you do have throw rugs, attach them to the floor with carpet tape. Make sure that you have a light switch at the top of the stairs and the bottom of the stairs. If you do not have them, ask someone to add them for you. What else can I do to help prevent falls? Wear shoes that: Do not have high heels. Have  rubber bottoms. Are comfortable and fit you well. Are closed at the toe. Do not wear sandals. If you use a stepladder: Make sure that it is fully opened. Do not climb a closed stepladder. Make sure that both sides of the stepladder are locked into place. Ask someone to hold it for you, if possible. Clearly mark and make sure that you can see: Any grab bars or handrails. First and last steps. Where the edge of each step is. Use tools that help you move around (mobility aids) if they are needed. These include: Canes. Walkers. Scooters. Crutches. Turn on the lights when you go into a dark area. Replace any light bulbs as soon as they burn out. Set up your furniture so you have a clear path. Avoid moving your furniture around. If any of your floors are uneven, fix them. If there are any pets around you, be aware of where they are. Review your medicines with your doctor. Some medicines can make you feel dizzy. This can increase your chance of falling. Ask your doctor what other things that you can do to help prevent falls. This information is not intended to replace advice given to you by your health care provider. Make sure you discuss any questions you have with your health care provider. Document Released: 05/16/2009 Document Revised: 12/26/2015 Document Reviewed: 08/24/2014 Elsevier Interactive Patient Education  2017 [employer].

## 2021-07-15 ENCOUNTER — Ambulatory Visit (INDEPENDENT_AMBULATORY_CARE_PROVIDER_SITE_OTHER): Payer: Medicare Other | Admitting: Nurse Practitioner

## 2021-07-15 ENCOUNTER — Encounter: Payer: Self-pay | Admitting: Nurse Practitioner

## 2021-07-15 VITALS — BP 116/64 | HR 80 | Temp 97.3°F | Resp 20 | Ht 71.0 in | Wt 204.0 lb

## 2021-07-15 DIAGNOSIS — I10 Essential (primary) hypertension: Secondary | ICD-10-CM

## 2021-07-15 DIAGNOSIS — F17200 Nicotine dependence, unspecified, uncomplicated: Secondary | ICD-10-CM

## 2021-07-15 DIAGNOSIS — F325 Major depressive disorder, single episode, in full remission: Secondary | ICD-10-CM

## 2021-07-15 DIAGNOSIS — E876 Hypokalemia: Secondary | ICD-10-CM

## 2021-07-15 DIAGNOSIS — E782 Mixed hyperlipidemia: Secondary | ICD-10-CM | POA: Diagnosis not present

## 2021-07-15 DIAGNOSIS — Z6828 Body mass index (BMI) 28.0-28.9, adult: Secondary | ICD-10-CM

## 2021-07-15 MED ORDER — HYDROCHLOROTHIAZIDE 25 MG PO TABS: 25.0000 mg | ORAL_TABLET | Freq: Every day | ORAL | 1 refills | Status: DC

## 2021-07-15 MED ORDER — LOSARTAN POTASSIUM 100 MG PO TABS: 100.0000 mg | ORAL_TABLET | Freq: Every day | ORAL | 1 refills | Status: DC

## 2021-07-15 MED ORDER — POTASSIUM CHLORIDE CRYS ER 20 MEQ PO TBCR: 20.0000 meq | EXTENDED_RELEASE_TABLET | Freq: Every day | ORAL | 1 refills | Status: DC

## 2021-07-15 MED ORDER — ATORVASTATIN CALCIUM 40 MG PO TABS: 40.0000 mg | ORAL_TABLET | Freq: Every day | ORAL | 1 refills | Status: DC

## 2021-07-15 NOTE — Patient Instructions (Signed)
Managing the Challenge of Quitting Smoking ?Quitting smoking is a physical and mental challenge. You will face cravings, withdrawal symptoms, and temptation. Before quitting, work with your health care provider to make a plan that can help you manage quitting. Preparation can help you quit and keep you from giving in. ?How to manage lifestyle changes ?Managing stress ?Stress can make you want to smoke, and wanting to smoke may cause stress. It is important to find ways to manage your stress. You might try some of the following: ?Practice relaxation techniques. ?Breathe slowly and deeply, in through your nose and out through your mouth. ?Listen to music. ?Soak in a bath or take a shower. ?Imagine a peaceful place or vacation. ?Get some support. ?Talk with family or friends about your stress. ?Join a support group. ?Talk with a counselor or therapist. ?Get some physical activity. ?Go for a walk, run, or bike ride. ?Play a favorite sport. ?Practice yoga. ? ?Medicines ?Talk with your health care provider about medicines that might help you deal with cravings and make quitting easier for you. ?Relationships ?Social situations can be difficult when you are quitting smoking. To manage this, you can: ?Avoid parties and other social situations where people might be smoking. ?Avoid alcohol. ?Leave right away if you have the urge to smoke. ?Explain to your family and friends that you are quitting smoking. Ask for support and let them know you might be a bit grumpy. ?Plan activities where smoking is not an option. ?General instructions ?Be aware that many people gain weight after they quit smoking. However, not everyone does. To keep from gaining weight, have a plan in place before you quit and stick to the plan after you quit. Your plan should include: ?Having healthy snacks. When you have a craving, it may help to: ?Eat popcorn, carrots, celery, or other cut vegetables. ?Chew sugar-free gum. ?Changing how you eat. ?Eat small  portion sizes at meals. ?Eat 4-6 small meals throughout the day instead of 1-2 large meals a day. ?Be mindful when you eat. Do not watch television or do other things that might distract you as you eat. ?Exercising regularly. ?Make time to exercise each day. If you do not have time for a long workout, do short bouts of exercise for 5-10 minutes several times a day. ?Do some form of strengthening exercise, such as weight lifting. ?Do some exercise that gets your heart beating and causes you to breathe deeply, such as walking fast, running, swimming, or biking. This is very important. ?Drinking plenty of water or other low-calorie or no-calorie drinks. Drink 6-8 glasses of water daily. ? ?How to recognize withdrawal symptoms ?Your body and mind may experience discomfort as you try to get used to not having nicotine in your system. These effects are called withdrawal symptoms. They may include: ?Feeling hungrier than normal. ?Having trouble concentrating. ?Feeling irritable or restless. ?Having trouble sleeping. ?Feeling depressed. ?Craving a cigarette. ?To manage withdrawal symptoms: ?Avoid places, people, and activities that trigger your cravings. ?Remember why you want to quit. ?Get plenty of sleep. ?Avoid coffee and other caffeinated drinks. These may worsen some of your symptoms. ?These symptoms may surprise you. But be assured that they are normal to have when quitting smoking. ?How to manage cravings ?Come up with a plan for how to deal with your cravings. The plan should include the following: ?A definition of the specific situation you want to deal with. ?An alternative action you will take. ?A clear idea for how this action   will help. ?The name of someone who might help you with this. ?Cravings usually last for 5-10 minutes. Consider taking the following actions to help you with your plan to deal with cravings: ?Keep your mouth busy. ?Chew sugar-free gum. ?Suck on hard candies or a straw. ?Brush your  teeth. ?Keep your hands and body busy. ?Change to a different activity right away. ?Squeeze or play with a ball. ?Do an activity or a hobby, such as making bead jewelry, practicing needlepoint, or working with wood. ?Mix up your normal routine. ?Take a short exercise break. Go for a quick walk or run up and down stairs. ?Focus on doing something kind or helpful for someone else. ?Call a friend or family member to talk during a craving. ?Join a support group. ?Contact a quitline. ?Where to find support ?To get help or find a support group: ?Call the National Cancer Institute's Smoking Quitline: 1-800-QUIT NOW (784-8669) ?Visit the website of the Substance Abuse and Mental Health Services Administration: www.samhsa.gov ?Text QUIT to SmokefreeTXT: 478848 ?Where to find more information ?Visit these websites to find more information on quitting smoking: ?National Cancer Institute: www.smokefree.gov ?American Lung Association: www.lung.org ?American Cancer Society: www.cancer.org ?Centers for Disease Control and Prevention: www.cdc.gov ?American Heart Association: www.heart.org ?Contact a health care provider if: ?You want to change your plan for quitting. ?The medicines you are taking are not helping. ?Your eating feels out of control or you cannot sleep. ?Get help right away if: ?You feel depressed or become very anxious. ?Summary ?Quitting smoking is a physical and mental challenge. You will face cravings, withdrawal symptoms, and temptation to smoke again. Preparation can help you as you go through these challenges. ?Try different techniques to manage stress, handle social situations, and prevent weight gain. ?You can deal with cravings by keeping your mouth busy (such as by chewing gum), keeping your hands and body busy, calling family or friends, or contacting a quitline for people who want to quit smoking. ?You can deal with withdrawal symptoms by avoiding places where people smoke, getting plenty of rest, and  avoiding drinks with caffeine. ?This information is not intended to replace advice given to you by your health care provider. Make sure you discuss any questions you have with your health care provider. ?Document Revised: 03/28/2021 Document Reviewed: 05/09/2019 ?Elsevier Patient Education ? 2022 Elsevier Inc. ? ?

## 2021-07-15 NOTE — Progress Notes (Signed)
Subjective:    Patient ID: Dean Mcconnell, male    DOB: 10/28/1945, 75 y.o.   MRN: [id number]   Chief Complaint: medical management of chronic issues     HPI:  1. Primary hypertension No c/o chest pain, sob or headache. Does not check blood pressure at home. BP Readings from Last 3 Encounters:  04/14/21 128/64  01/10/21 (!) 145/76  10/01/20 137/73     2. Mixed hyperlipidemia Does watch diet and exercises daily. Lab Results  Component Value Date   CHOL 136 04/14/2021   HDL 53 04/14/2021   LDLCALC 69 04/14/2021   TRIG 71 04/14/2021   CHOLHDL 2.6 04/14/2021     3. Major depressive disorder with single episode, in full remission [facility]) He is no longer on antidepressant Depression screen [facility] 2/9 07/15/2021 06/11/2021 04/14/2021  Decreased Interest 0 0 0  Down, Depressed, Hopeless 0 0 0  PHQ - 2 Score 0 0 0  Altered sleeping 0 0 0  Tired, decreased energy 0 0 0  Change in appetite 0 0 0  Feeling bad or failure about yourself  0 0 0  Trouble concentrating 0 0 0  Moving slowly or fidgety/restless 0 0 0  Suicidal thoughts 0 0 0  PHQ-9 Score 0 0 0  Difficult doing work/chores Not difficult at all Not difficult at all Not difficult at all  Some recent data might be hidden     4. hypokalemia Is on daily potassium supplement. Denies any lower ext cramping. Lab Results  Component Value Date   K 4.3 04/14/2021      5. Smoker Smokes about a pack a day  6. BMI 28.0-28.9,adult Weight down 1 lb Wt Readings from Last 3 Encounters:  07/15/21 204 lb (92.5 kg)  06/11/21 205 lb (93 kg)  04/14/21 205 lb (93 kg)   BMI Readings from Last 3 Encounters:  07/15/21 28.45 kg/m  06/11/21 28.59 kg/m  04/14/21 28.59 kg/m       Outpatient Encounter Medications as of 07/15/2021  Medication Sig   atorvastatin (LIPITOR) 40 MG tablet Take 1 tablet (40 mg total) by mouth daily.   cetirizine (ZYRTEC) 10 MG tablet Take 1 tablet (10 mg total) by mouth daily.   fluticasone  (FLONASE) 50 MCG/ACT nasal spray Place 2 sprays into both nostrils daily.   hydrochlorothiazide (HYDRODIURIL) 25 MG tablet Take 1 tablet (25 mg total) by mouth daily.   losartan (COZAAR) 100 MG tablet Take 1 tablet (100 mg total) by mouth daily.   potassium chloride SA (KLOR-CON) 20 MEQ tablet Take 1 tablet (20 mEq total) by mouth daily.   sildenafil (VIAGRA) 100 MG tablet 1 po prn   No facility-administered encounter medications on file as of 07/15/2021.    Past Surgical History:  Procedure Laterality Date   SKIN CANCER EXCISION     nose    Family History  Problem Relation Age of Onset   Hypertension Father     New complaints: C/o runny nose and slight cough.  Social history: Lives by hisself  Controlled substance contract: n/a     Review of Systems  Constitutional:  Negative for diaphoresis.  Eyes:  Negative for pain.  Respiratory:  Negative for shortness of breath.   Cardiovascular:  Negative for chest pain, palpitations and leg swelling.  Gastrointestinal:  Negative for abdominal pain.  Endocrine: Negative for polydipsia.  Skin:  Negative for rash.  Neurological:  Negative for dizziness, weakness and headaches.  Hematological:  Does not bruise/bleed easily.  All other systems reviewed and are negative.     Objective:   Physical Exam Vitals and nursing note reviewed.  Constitutional:      Appearance: Normal appearance. He is well-developed.  HENT:     Head: Normocephalic.     Nose: Nose normal.     Mouth/Throat:     Mouth: Mucous membranes are moist.     Pharynx: Oropharynx is clear.  Eyes:     Pupils: Pupils are equal, round, and reactive to light.  Neck:     Thyroid: No thyroid mass or thyromegaly.     Vascular: No carotid bruit or JVD.     Trachea: Phonation normal.  Cardiovascular:     Rate and Rhythm: Normal rate and regular rhythm.  Pulmonary:     Effort: Pulmonary effort is normal. No respiratory distress.     Breath sounds: Normal breath  sounds.  Abdominal:     General: Bowel sounds are normal.     Palpations: Abdomen is soft.     Tenderness: There is no abdominal tenderness.  Musculoskeletal:        General: Normal range of motion.     Cervical back: Normal range of motion and neck supple.  Lymphadenopathy:     Cervical: No cervical adenopathy.  Skin:    General: Skin is warm and dry.  Neurological:     Mental Status: He is alert and oriented to person, place, and time.  Psychiatric:        Behavior: Behavior normal.        Thought Content: Thought content normal.        Judgment: Judgment normal.    BP 116/64   Pulse 80   Temp (!) 97.3 F (36.3 C) (Temporal)   Resp 20   Ht $R'5\' 11"'UO$  (1.803 m)   Wt 204 lb (92.5 kg)   SpO2 98%   BMI 28.45 kg/m        Assessment & Plan:   Dean Mcconnell comes in today with chief complaint of Medical Management of Chronic Issues   Diagnosis and orders addressed:  1. Primary hypertension Low sodium diet - hydrochlorothiazide (HYDRODIURIL) 25 MG tablet; Take 1 tablet (25 mg total) by mouth daily.  Dispense: 90 tablet; Refill: 1 - losartan (COZAAR) 100 MG tablet; Take 1 tablet (100 mg total) by mouth daily.  Dispense: 90 tablet; Refill: 1 - CBC with Differential/Platelet - CMP14+EGFR  2. Mixed hyperlipidemia Low fat deit - atorvastatin (LIPITOR) 40 MG tablet; Take 1 tablet (40 mg total) by mouth daily.  Dispense: 90 tablet; Refill: 1 - Lipid panel  3. Major depressive disorder with single episode, in full remission [facility]) Stress management  4. Smoker Smoking cessation encouraged  5. BMI 28.0-28.9,adult Discussed diet and exercise for person with BMI >25 Will recheck weight in 3-6 months   6. Hypokalemia - potassium chloride SA (KLOR-CON M) 20 MEQ tablet; Take 1 tablet (20 mEq total) by mouth daily.  Dispense: 90 tablet; Refill: 1   Labs pending Health Maintenance reviewed Diet and exercise encouraged  Follow up plan: 3 months   Mary-Margaret Hassell Done,  FNP

## 2021-07-16 LAB — LIPID PANEL
Chol/HDL Ratio: 3 ratio (ref 0.0–5.0)
Cholesterol, Total: 122 mg/dL (ref 100–199)
HDL: 41 mg/dL (ref >39)
LDL Chol Calc (NIH): 61 mg/dL (ref 0–99)
Triglycerides: 106 mg/dL (ref 0–149)
VLDL Cholesterol Cal: 20 mg/dL (ref 5–40)

## 2021-07-16 LAB — CBC WITH DIFFERENTIAL/PLATELET
Basophils Absolute: 0 10*3/uL (ref 0.0–0.2)
Basos: 1 %
EOS (ABSOLUTE): 0.1 10*3/uL (ref 0.0–0.4)
Eos: 1 %
Hematocrit: 46.3 % (ref 37.5–51.0)
Hemoglobin: 15.7 g/dL (ref 13.0–17.7)
Immature Grans (Abs): 0 10*3/uL (ref 0.0–0.1)
Immature Granulocytes: 0 %
Lymphocytes Absolute: 3.6 10*3/uL — ABNORMAL HIGH (ref 0.7–3.1)
Lymphs: 41 %
MCH: 31.9 pg (ref 26.6–33.0)
MCHC: 33.9 g/dL (ref 31.5–35.7)
MCV: 94 fL (ref 79–97)
Monocytes Absolute: 0.9 10*3/uL (ref 0.1–0.9)
Monocytes: 11 %
Neutrophils Absolute: 4.1 10*3/uL (ref 1.4–7.0)
Neutrophils: 46 %
Platelets: 176 10*3/uL (ref 150–450)
RBC: 4.92 x10E6/uL (ref 4.14–5.80)
RDW: 13.2 % (ref 11.6–15.4)
WBC: 8.8 10*3/uL (ref 3.4–10.8)

## 2021-07-16 LAB — CMP14+EGFR
ALT: 7 IU/L (ref 0–44)
AST: 14 IU/L (ref 0–40)
Albumin/Globulin Ratio: 2 (ref 1.2–2.2)
Albumin: 4.3 g/dL (ref 3.7–4.7)
Alkaline Phosphatase: 125 IU/L — ABNORMAL HIGH (ref 44–121)
BUN/Creatinine Ratio: 12 (ref 10–24)
BUN: 25 mg/dL (ref 8–27)
Bilirubin Total: 0.7 mg/dL (ref 0.0–1.2)
CO2: 24 mmol/L (ref 20–29)
Calcium: 9.4 mg/dL (ref 8.6–10.2)
Chloride: 101 mmol/L (ref 96–106)
Creatinine, Ser: 2.13 mg/dL — ABNORMAL HIGH (ref 0.76–1.27)
Globulin, Total: 2.2 g/dL (ref 1.5–4.5)
Glucose: 95 mg/dL (ref 70–99)
Potassium: 3.9 mmol/L (ref 3.5–5.2)
Sodium: 142 mmol/L (ref 134–144)
Total Protein: 6.5 g/dL (ref 6.0–8.5)
eGFR: 32 mL/min/{1.73_m2} — ABNORMAL LOW (ref >59)

## 2021-10-13 ENCOUNTER — Encounter: Payer: Self-pay | Admitting: Nurse Practitioner

## 2021-10-13 ENCOUNTER — Ambulatory Visit (INDEPENDENT_AMBULATORY_CARE_PROVIDER_SITE_OTHER): Payer: Medicare Other | Admitting: Nurse Practitioner

## 2021-10-13 VITALS — BP 117/70 | HR 86 | Temp 97.8°F | Resp 20 | Ht 71.0 in | Wt 208.0 lb

## 2021-10-13 DIAGNOSIS — E876 Hypokalemia: Secondary | ICD-10-CM

## 2021-10-13 DIAGNOSIS — I10 Essential (primary) hypertension: Secondary | ICD-10-CM

## 2021-10-13 DIAGNOSIS — E782 Mixed hyperlipidemia: Secondary | ICD-10-CM

## 2021-10-13 DIAGNOSIS — F325 Major depressive disorder, single episode, in full remission: Secondary | ICD-10-CM | POA: Diagnosis not present

## 2021-10-13 DIAGNOSIS — F17200 Nicotine dependence, unspecified, uncomplicated: Secondary | ICD-10-CM

## 2021-10-13 DIAGNOSIS — Z6828 Body mass index (BMI) 28.0-28.9, adult: Secondary | ICD-10-CM

## 2021-10-13 LAB — LIPID PANEL
Chol/HDL Ratio: 2.6 ratio (ref 0.0–5.0)
Cholesterol, Total: 119 mg/dL (ref 100–199)
HDL: 45 mg/dL (ref >39)
LDL Chol Calc (NIH): 57 mg/dL (ref 0–99)
Triglycerides: 89 mg/dL (ref 0–149)
VLDL Cholesterol Cal: 17 mg/dL (ref 5–40)

## 2021-10-13 LAB — CMP14+EGFR
ALT: 6 IU/L (ref 0–44)
AST: 13 IU/L (ref 0–40)
Albumin/Globulin Ratio: 1.8 (ref 1.2–2.2)
Albumin: 3.9 g/dL (ref 3.7–4.7)
Alkaline Phosphatase: 103 IU/L (ref 44–121)
BUN/Creatinine Ratio: 14 (ref 10–24)
BUN: 28 mg/dL — ABNORMAL HIGH (ref 8–27)
Bilirubin Total: 0.6 mg/dL (ref 0.0–1.2)
CO2: 26 mmol/L (ref 20–29)
Calcium: 9.6 mg/dL (ref 8.6–10.2)
Chloride: 103 mmol/L (ref 96–106)
Creatinine, Ser: 1.94 mg/dL — ABNORMAL HIGH (ref 0.76–1.27)
Globulin, Total: 2.2 g/dL (ref 1.5–4.5)
Glucose: 90 mg/dL (ref 70–99)
Potassium: 3.9 mmol/L (ref 3.5–5.2)
Sodium: 142 mmol/L (ref 134–144)
Total Protein: 6.1 g/dL (ref 6.0–8.5)
eGFR: 36 mL/min/{1.73_m2} — ABNORMAL LOW (ref >59)

## 2021-10-13 LAB — CBC WITH DIFFERENTIAL/PLATELET
Basophils Absolute: 0 10*3/uL (ref 0.0–0.2)
Basos: 0 %
EOS (ABSOLUTE): 0.1 10*3/uL (ref 0.0–0.4)
Eos: 1 %
Hematocrit: 42.8 % (ref 37.5–51.0)
Hemoglobin: 14.9 g/dL (ref 13.0–17.7)
Immature Grans (Abs): 0 10*3/uL (ref 0.0–0.1)
Immature Granulocytes: 0 %
Lymphocytes Absolute: 3 10*3/uL (ref 0.7–3.1)
Lymphs: 26 %
MCH: 32.3 pg (ref 26.6–33.0)
MCHC: 34.8 g/dL (ref 31.5–35.7)
MCV: 93 fL (ref 79–97)
Monocytes Absolute: 1.2 10*3/uL — ABNORMAL HIGH (ref 0.1–0.9)
Monocytes: 11 %
Neutrophils Absolute: 7.1 10*3/uL — ABNORMAL HIGH (ref 1.4–7.0)
Neutrophils: 62 %
Platelets: 174 10*3/uL (ref 150–450)
RBC: 4.61 x10E6/uL (ref 4.14–5.80)
RDW: 12.4 % (ref 11.6–15.4)
WBC: 11.6 10*3/uL — ABNORMAL HIGH (ref 3.4–10.8)

## 2021-10-13 MED ORDER — ATORVASTATIN CALCIUM 40 MG PO TABS: 40.0000 mg | ORAL_TABLET | Freq: Every day | ORAL | 1 refills | Status: DC

## 2021-10-13 MED ORDER — POTASSIUM CHLORIDE CRYS ER 20 MEQ PO TBCR: 20.0000 meq | EXTENDED_RELEASE_TABLET | Freq: Every day | ORAL | 1 refills | Status: DC

## 2021-10-13 MED ORDER — LOSARTAN POTASSIUM 100 MG PO TABS: 100.0000 mg | ORAL_TABLET | Freq: Every day | ORAL | 1 refills | Status: DC

## 2021-10-13 MED ORDER — HYDROCHLOROTHIAZIDE 25 MG PO TABS: 25.0000 mg | ORAL_TABLET | Freq: Every day | ORAL | 1 refills | Status: DC

## 2021-10-13 NOTE — Patient Instructions (Signed)
Stress, Adult °Stress is a normal reaction to life events. Stress is what you feel when life demands more than you are used to, or more than you think you can handle. °Some stress can be useful, such as studying for a test or meeting a deadline at work. Stress that occurs too often or for too long can cause problems. Long-lasting stress is called chronic stress. Chronic stress can affect your emotional health and interfere with relationships and normal daily activities. °Too much stress can weaken your body's defense system (immune system) and increase your risk for physical illness. If you already have a medical problem, stress can make it worse. °What are the causes? °All sorts of life events can cause stress. An event that causes stress for one person may not be stressful for someone else. Major life events, whether positive or negative, commonly cause stress. Examples include: °Losing a job or starting a new job. °Losing a loved one. °Moving to a new town or home. °Getting married or divorced. °Having a baby. °Getting injured or sick. °Less obvious life events can also cause stress, especially if they occur day after day or in combination with each other. Examples include: °Working long hours. °Driving in traffic. °Caring for children. °Being in debt. °Being in a difficult relationship. °What are the signs or symptoms? °Stress can cause emotional and physical symptoms and can lead to unhealthy behaviors. These include the following: °Emotional symptoms °Anxiety. This is feeling worried, afraid, on edge, overwhelmed, or out of control. °Anger, including irritation or impatience. °Depression. This is feeling sad, down, helpless, or guilty. °Trouble focusing, remembering, or making decisions. °Physical symptoms °Aches and pains. These may affect your head, neck, back, stomach, or other areas of your body. °Tight muscles or a clenched jaw. °Low energy. °Trouble sleeping. °Unhealthy behaviors °Eating to feel better  (overeating) or skipping meals. °Working too much or putting off tasks. °Smoking, drinking alcohol, or using drugs to feel better. °How is this diagnosed? °A stress disorder is diagnosed through an assessment by your health care provider. A stress disorder may be diagnosed based on: °Your symptoms and any stressful life events. °Your medical history. °Tests to rule out other causes of your symptoms. °Depending on your condition, your health care provider may refer you to a specialist for further evaluation. °How is this treated? °Stress management techniques are the recommended treatment for stress. Medicine is not typically recommended for treating stress. °Techniques to reduce your reaction to stressful life events include: °Identifying stress. Monitor yourself for symptoms of stress and notice what causes stress for you. These skills may help you to avoid or prepare for stressful events. °Managing time. Set your priorities, keep a calendar of events, and learn to say no. These actions can help you avoid taking on too much. °Techniques for dealing with stress include: °Rethinking the problem. Try to think realistically about stressful events rather than ignoring them or overreacting. Try to find the positives in a stressful situation rather than focusing on the negatives. °Exercise. Physical exercise can release both physical and emotional tension. The key is to find a form of exercise that you enjoy and do it regularly. °Relaxation techniques. These relax the body and mind. Find one or more that you enjoy and use the techniques regularly. Examples include: °Meditation, deep breathing, or progressive relaxation techniques. °Yoga or tai chi. °Biofeedback, mindfulness techniques, or journaling. °Listening to music, being in nature, or taking part in other hobbies. °Practicing a healthy lifestyle. Eat a balanced diet,   drink plenty of water, limit or avoid caffeine, and get plenty of sleep. °Having a strong support  network. Spend time with family, friends, or other people you enjoy being around. Express your feelings and talk things over with someone you trust. °Counseling or talk therapy with a mental health provider may help if you are having trouble managing stress by yourself. °Follow these instructions at home: °Lifestyle ° °Avoid drugs. °Do not use any products that contain nicotine or tobacco. These products include cigarettes, chewing tobacco, and vaping devices, such as e-cigarettes. If you need help quitting, ask your health care provider. °If you drink alcohol: °Limit how much you have to: °0-1 drink a day for women who are not pregnant. °0-2 drinks a day for men. °Know how much alcohol is in a drink. In the [place] one drink equals one 12 oz bottle of beer (355 mL), one 5 oz glass of wine (148 mL), or one 1½ oz glass of hard liquor (44 mL). °Do not use alcohol or drugs to relax. °Eat a balanced diet that includes fresh fruits and vegetables, whole grains, lean meats, fish, eggs, beans, and low-fat dairy. Avoid processed foods and foods high in added fat, sugar, and salt. °Exercise at least 30 minutes on 5 or more days each week. °Get 7-8 hours of sleep each night. °General instructions ° °Practice stress management techniques as told by your health care provider. °Drink enough fluid to keep your urine pale yellow. °Take over-the-counter and prescription medicines only as told by your health care provider. °Keep all follow-up visits. This is important. °Contact a health care provider if: °Your symptoms get worse. °You have new symptoms. °You feel overwhelmed by your problems and can no longer manage them by yourself. °Get help right away if: °You have thoughts of hurting yourself or others. °Get help right awayif you feel like you may hurt yourself or others, or have thoughts about taking your own life. Go to your nearest emergency room or: °Call 911. °Call the National Suicide Prevention Lifeline at 1-800-273-8255 or  988. This is open 24 hours a day. °Text the Crisis Text Line at 741741. °Summary °Stress is a normal reaction to life events. It can cause problems if it happens too often or for too long. °Practicing stress management techniques is the best way to treat stress. °Counseling or talk therapy with a mental health provider may help if you are having trouble managing stress by yourself. °This information is not intended to replace advice given to you by your health care provider. Make sure you discuss any questions you have with your health care provider. °Document Revised: 02/27/2021 Document Reviewed: 02/27/2021 °Elsevier Patient Education © 2022 [employer]. ° °

## 2021-10-13 NOTE — Progress Notes (Signed)
? ?Subjective:  ? ? Patient ID: Dean Mcconnell, male    DOB: 03/25/1946, 76 y.o.   MRN: [id number] ? ?Chief Complaint: medical management of chronic issues  ?  ? ?HPI: ? ?Dean Mcconnell is a 76 y.o. who identifies as a male who was assigned male at birth.  ? ?Social history: ?Lives with: no one ?Work history: friends and family check on him frequently ? ? ?Comes in today for follow up of the following chronic medical issues: ? ?1. Primary hypertension ?No c/o chest pain, sob or headache. He does not check his blood pressure at home. ?BP Readings from Last 3 Encounters:  ?07/15/21 116/64  ?04/14/21 128/64  ?01/10/21 (!) 145/76  ? ? ? ?2. Mixed hyperlipidemia ?He does try to watch  diet and exercises daily. ?Lab Results  ?Component Value Date  ? CHOL 122 07/15/2021  ? HDL 41 07/15/2021  ? [place] 61 07/15/2021  ? TRIG 106 07/15/2021  ? CHOLHDL 3.0 07/15/2021  ? ?The ASCVD Risk score (Arnett DK, et al., 2019) failed to calculate for the following reasons: ?  The valid total cholesterol range is 130 to 320 mg/dL ? ? ?3. Hypokalemia ?No c/o muscle cramping ?Lab Results  ?Component Value Date  ? K 3.9 07/15/2021  ? ? ? ?4. Major depressive disorder with single episode, in full remission ([place]) ?He denies any recent depression. Says he is doing well. ?Depression screen [facility] 2/9 10/13/2021 07/15/2021 06/11/2021  ?Decreased Interest 0 0 0  ?Down, Depressed, Hopeless 0 0 0  ?PHQ - 2 Score 0 0 0  ?Altered sleeping 0 0 0  ?Tired, decreased energy 0 0 0  ?Change in appetite 0 0 0  ?Feeling bad or failure about yourself  0 0 0  ?Trouble concentrating 0 0 0  ?Moving slowly or fidgety/restless 0 0 0  ?Suicidal thoughts 0 0 0  ?PHQ-9 Score 0 0 0  ?Difficult doing work/chores Not difficult at all Not difficult at all Not difficult at all  ?Some recent data might be hidden  ? ? ?5. Smoker ?Smokes about 1 pack a day to 2 days ? ?6. BMI 28.0-28.9,adult ?Weight is up 4 lbs ?Wt Readings from Last 3 Encounters:  ?10/13/21 208 lb (94.3 kg)   ?07/15/21 204 lb (92.5 kg)  ?06/11/21 205 lb (93 kg)  ? ?BMI Readings from Last 3 Encounters:  ?10/13/21 29.01 kg/m?  ?07/15/21 28.45 kg/m?  ?06/11/21 28.59 kg/m?  ? ? ? ? ?New complaints: ?None today ? ?Allergies  ?Allergen Reactions  ? Penicillins Swelling  ? ?Outpatient Encounter Medications as of 10/13/2021  ?Medication Sig  ? atorvastatin (LIPITOR) 40 MG tablet Take 1 tablet (40 mg total) by mouth daily.  ? cetirizine (ZYRTEC) 10 MG tablet Take 1 tablet (10 mg total) by mouth daily.  ? fluticasone (FLONASE) 50 MCG/ACT nasal spray Place 2 sprays into both nostrils daily.  ? hydrochlorothiazide (HYDRODIURIL) 25 MG tablet Take 1 tablet (25 mg total) by mouth daily.  ? losartan (COZAAR) 100 MG tablet Take 1 tablet (100 mg total) by mouth daily.  ? potassium chloride SA (KLOR-CON M) 20 MEQ tablet Take 1 tablet (20 mEq total) by mouth daily.  ? sildenafil (VIAGRA) 100 MG tablet 1 po prn  ? ?No facility-administered encounter medications on file as of 10/13/2021.  ? ? ?Past Surgical History:  ?Procedure Laterality Date  ? SKIN CANCER EXCISION    ? nose  ? ? ?Family History  ?Problem Relation Age of Onset  ? Hypertension Father   ? ? ? ? ?  Controlled substance contract: n/a ? ? ? ? ?Review of Systems  ?Constitutional:  Negative for diaphoresis.  ?Eyes:  Negative for pain.  ?Respiratory:  Negative for shortness of breath.   ?Cardiovascular:  Negative for chest pain, palpitations and leg swelling.  ?Gastrointestinal:  Negative for abdominal pain.  ?Endocrine: Negative for polydipsia.  ?Skin:  Negative for rash.  ?Neurological:  Negative for dizziness, weakness and headaches.  ?Hematological:  Does not bruise/bleed easily.  ?All other systems reviewed and are negative. ? ?   ?Objective:  ? Physical Exam ?Vitals and nursing note reviewed.  ?Constitutional:   ?   Appearance: Normal appearance. He is well-developed.  ?HENT:  ?   Head: Normocephalic.  ?   Nose: Nose normal.  ?   Mouth/Throat:  ?   Mouth: Mucous membranes are  moist.  ?   Pharynx: Oropharynx is clear.  ?Eyes:  ?   Pupils: Pupils are equal, round, and reactive to light.  ?Neck:  ?   Thyroid: No thyroid mass or thyromegaly.  ?   Vascular: No carotid bruit or JVD.  ?   Trachea: Phonation normal.  ?Cardiovascular:  ?   Rate and Rhythm: Normal rate and regular rhythm.  ?Pulmonary:  ?   Effort: Pulmonary effort is normal. No respiratory distress.  ?   Breath sounds: Normal breath sounds.  ?Abdominal:  ?   General: Bowel sounds are normal.  ?   Palpations: Abdomen is soft.  ?   Tenderness: There is no abdominal tenderness.  ?Musculoskeletal:     ?   General: Normal range of motion.  ?   Cervical back: Normal range of motion and neck supple.  ?Lymphadenopathy:  ?   Cervical: No cervical adenopathy.  ?Skin: ?   General: Skin is warm and dry.  ?Neurological:  ?   Mental Status: He is alert and oriented to person, place, and time.  ?Psychiatric:     ?   Behavior: Behavior normal.     ?   Thought Content: Thought content normal.     ?   Judgment: Judgment normal.  ? ? ?BP 117/70   Pulse 86   Temp 97.8 ?F (36.6 ?C) (Temporal)   Resp 20   Ht $R'5\' 11"'hn$  (1.803 m)   Wt 208 lb (94.3 kg)   SpO2 98%   BMI 29.01 kg/m?  ? ? ? ?   ?Assessment & Plan:  ? ?Dean Mcconnell comes in today with chief complaint of No chief complaint on file. ? ? ?Diagnosis and orders addressed: ? ?1. Primary hypertension ?Low sodium diet ?- hydrochlorothiazide (HYDRODIURIL) 25 MG tablet; Take 1 tablet (25 mg total) by mouth daily.  Dispense: 90 tablet; Refill: 1 ?- losartan (COZAAR) 100 MG tablet; Take 1 tablet (100 mg total) by mouth daily.  Dispense: 90 tablet; Refill: 1 ?- CBC with Differential/Platelet ?- CMP14+EGFR ? ?2. Mixed hyperlipidemia ?Low fat diet ?- atorvastatin (LIPITOR) 40 MG tablet; Take 1 tablet (40 mg total) by mouth daily.  Dispense: 90 tablet; Refill: 1 ?- Lipid panel ? ?3. Hypokalemia ?- potassium chloride SA (KLOR-CON M) 20 MEQ tablet; Take 1 tablet (20 mEq total) by mouth daily.  Dispense: 90  tablet; Refill: 1 ? ?4. Major depressive disorder with single episode, in full remission ([place]) ?Stress management ? ?5. Smoker ?Smoking cessation encouraged ? ?6. BMI 28.0-28.9,adult ?Discussed diet and exercise for person with BMI >25 ?Will recheck weight in 3-6 months ? ? ? ?Labs pending ?Health Maintenance reviewed ?Diet and exercise  encouraged ? ?Follow up plan: ?6 months ? ? ?Mary-Margaret Hassell Done, FNP ? ?

## 2021-12-17 IMAGING — DX DG CHEST 2V
2 series · 2 of 2 positions shown · non-contrast
Comparison: 03/21/2015

CLINICAL DATA: Hypertension

EXAM:
CHEST - 2 VIEW

[chest lat]
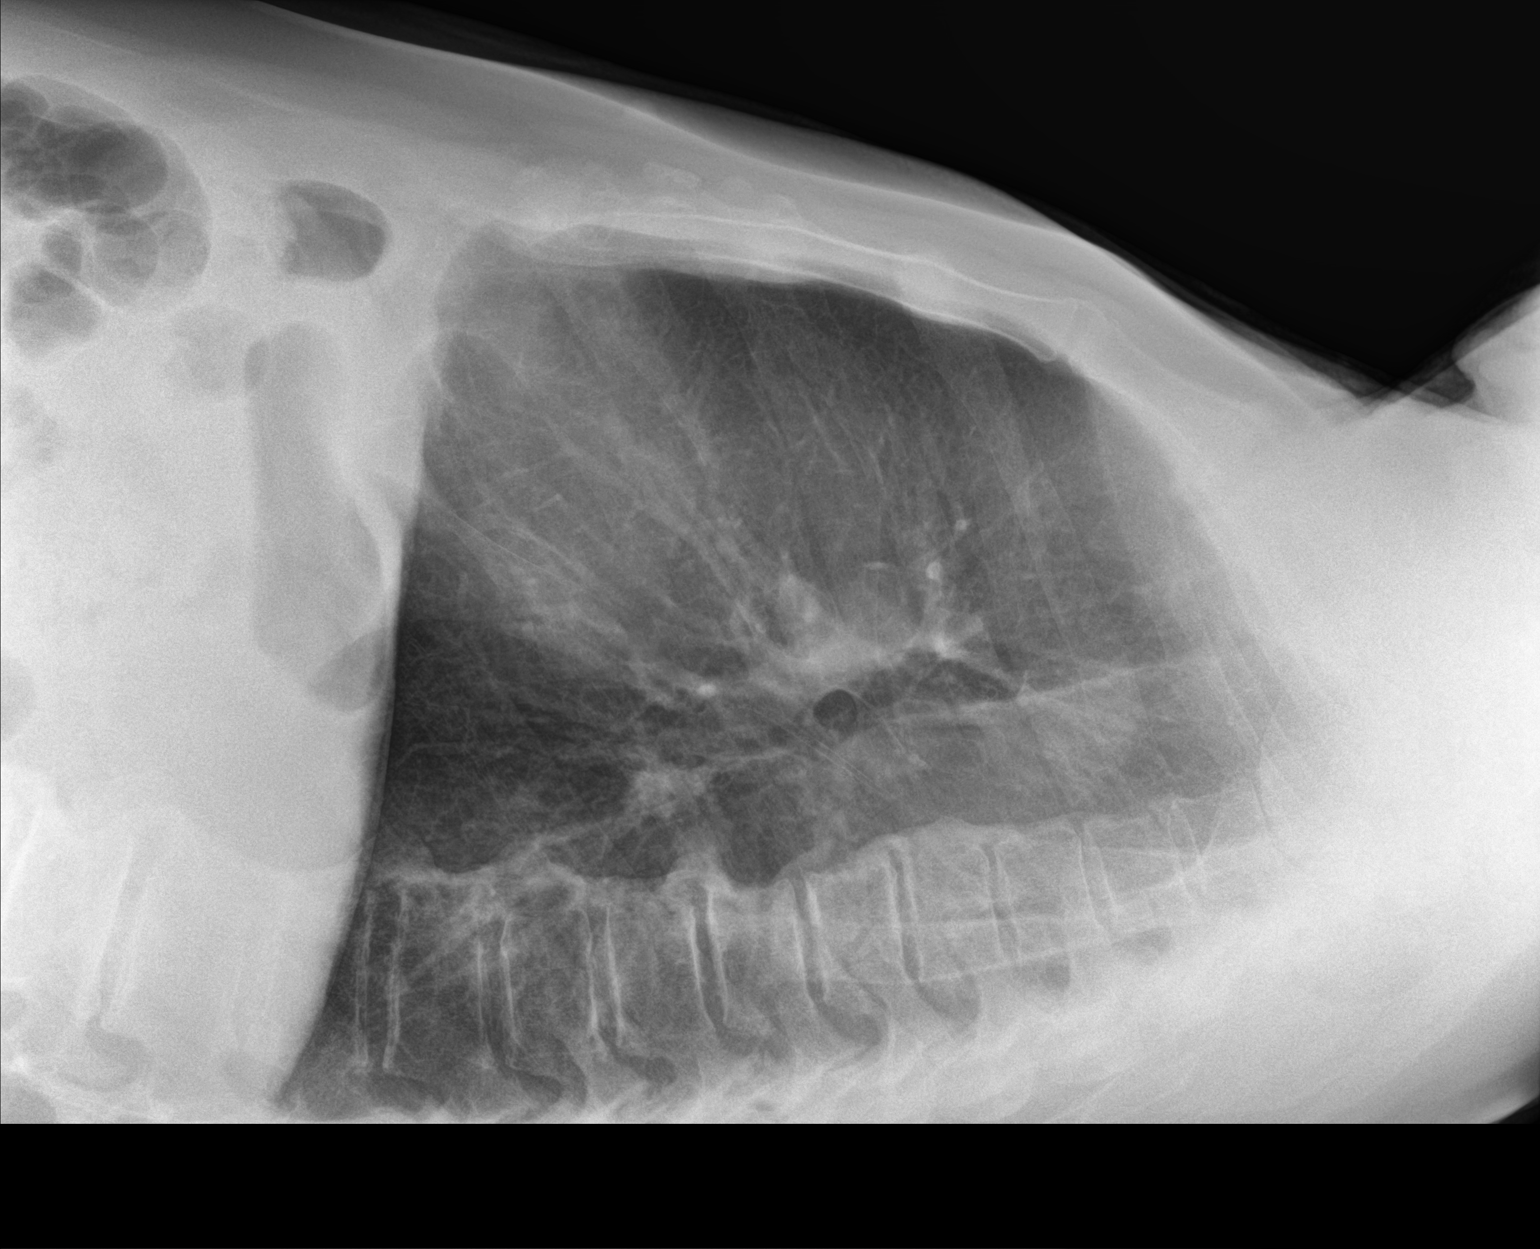

[chest pa]
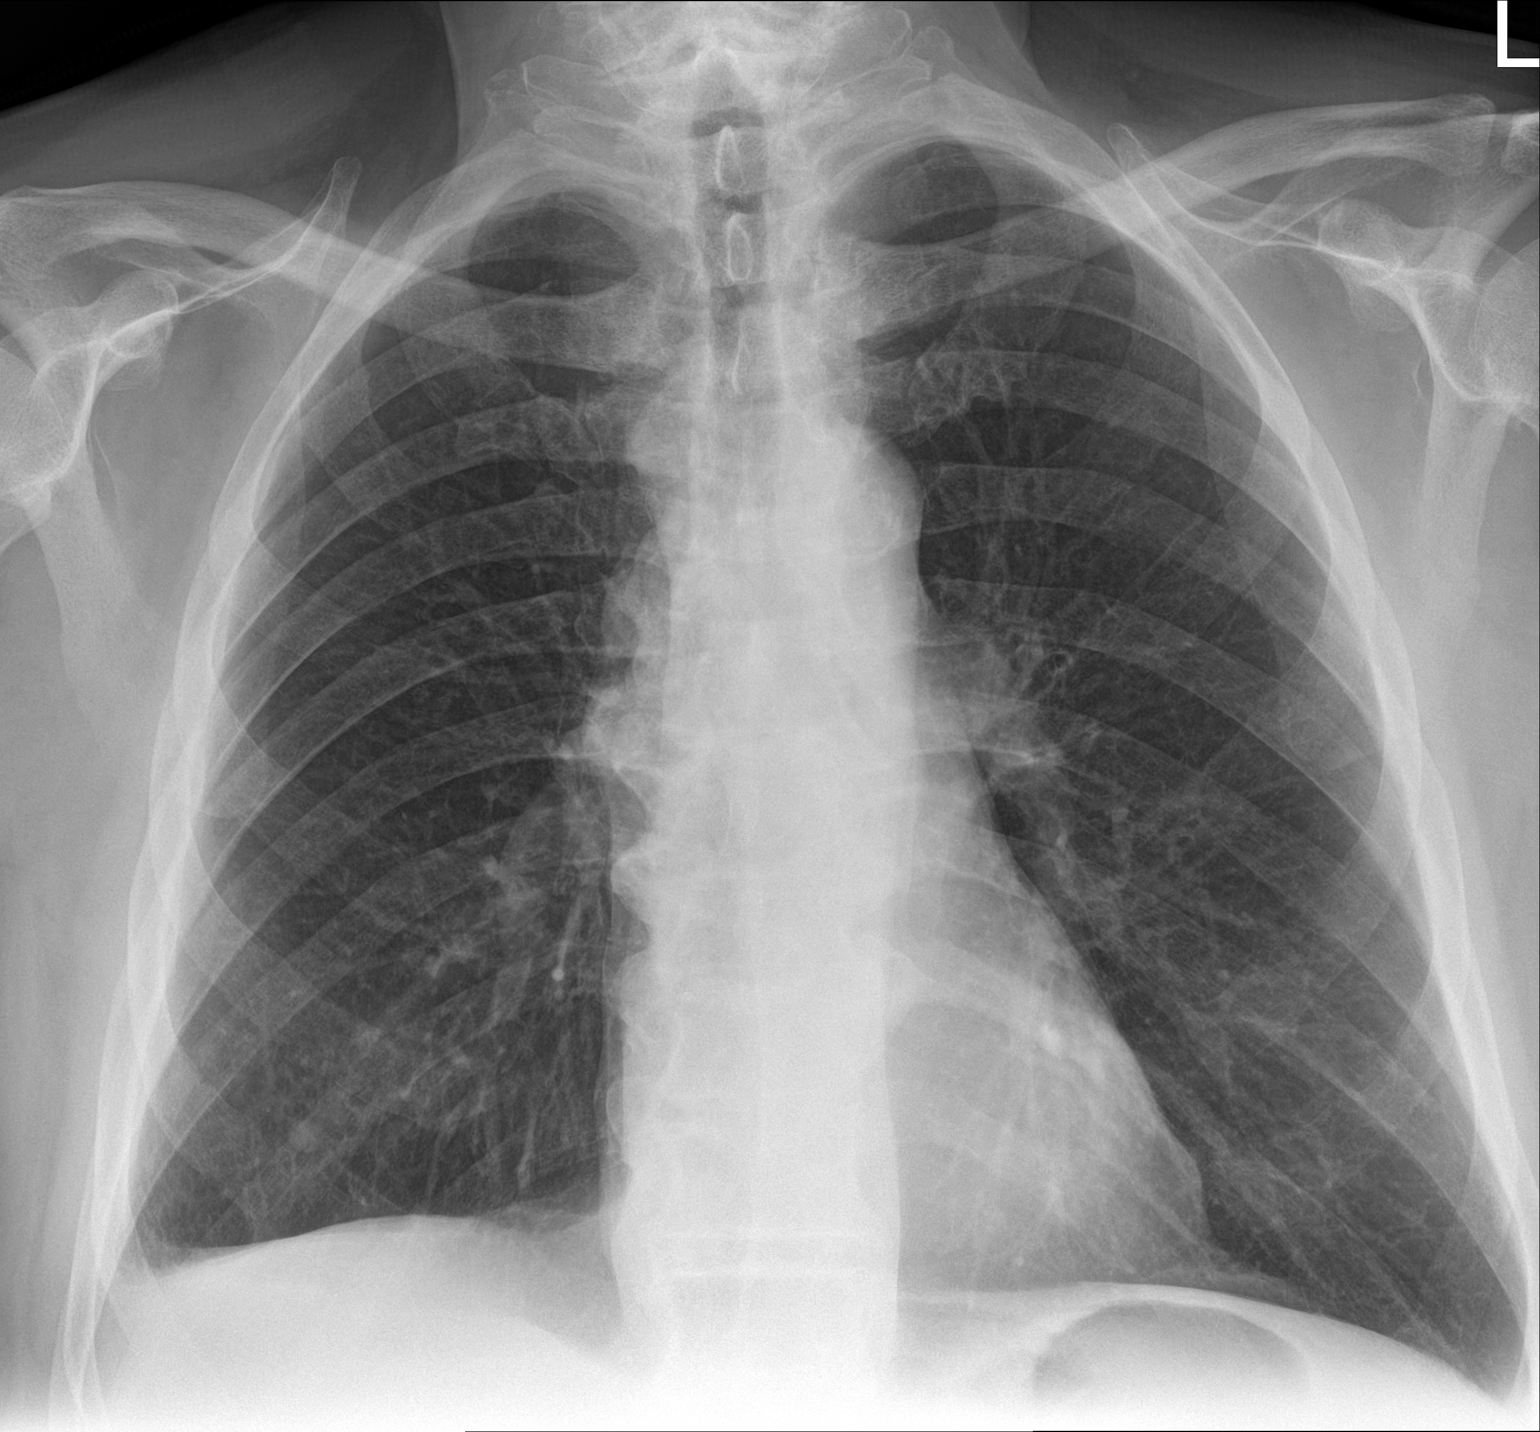

[2 of 2 positions shown; findings below may reference images not displayed]

FINDINGS: Cardiac shadow is within normal limits. Aortic calcifications are
seen. The lungs are hyperinflated consistent with COPD. No focal
infiltrate is noted. Degenerative changes of the thoracic spine are
seen.
IMPRESSION: COPD.

Aortic Atherosclerosis ([id number]-REQ.Q).

## 2022-04-16 ENCOUNTER — Ambulatory Visit (INDEPENDENT_AMBULATORY_CARE_PROVIDER_SITE_OTHER): Payer: Medicare Other | Admitting: Nurse Practitioner

## 2022-04-16 ENCOUNTER — Encounter: Payer: Self-pay | Admitting: Nurse Practitioner

## 2022-04-16 ENCOUNTER — Ambulatory Visit: Payer: Medicare Other | Admitting: Nurse Practitioner

## 2022-04-16 VITALS — BP 111/66 | HR 85 | Temp 97.1°F | Resp 20 | Ht 71.0 in | Wt 205.0 lb

## 2022-04-16 DIAGNOSIS — I10 Essential (primary) hypertension: Secondary | ICD-10-CM

## 2022-04-16 DIAGNOSIS — F325 Major depressive disorder, single episode, in full remission: Secondary | ICD-10-CM | POA: Diagnosis not present

## 2022-04-16 DIAGNOSIS — F17200 Nicotine dependence, unspecified, uncomplicated: Secondary | ICD-10-CM | POA: Diagnosis not present

## 2022-04-16 DIAGNOSIS — E876 Hypokalemia: Secondary | ICD-10-CM | POA: Diagnosis not present

## 2022-04-16 DIAGNOSIS — E782 Mixed hyperlipidemia: Secondary | ICD-10-CM | POA: Diagnosis not present

## 2022-04-16 DIAGNOSIS — Z6828 Body mass index (BMI) 28.0-28.9, adult: Secondary | ICD-10-CM

## 2022-04-16 MED ORDER — POTASSIUM CHLORIDE CRYS ER 20 MEQ PO TBCR: 20.0000 meq | EXTENDED_RELEASE_TABLET | Freq: Every day | ORAL | 1 refills | Status: DC

## 2022-04-16 MED ORDER — ATORVASTATIN CALCIUM 40 MG PO TABS: 40.0000 mg | ORAL_TABLET | Freq: Every day | ORAL | 1 refills | Status: DC

## 2022-04-16 MED ORDER — LOSARTAN POTASSIUM 100 MG PO TABS: 100.0000 mg | ORAL_TABLET | Freq: Every day | ORAL | 1 refills | Status: DC

## 2022-04-16 MED ORDER — HYDROCHLOROTHIAZIDE 25 MG PO TABS: 25.0000 mg | ORAL_TABLET | Freq: Every day | ORAL | 1 refills | Status: DC

## 2022-04-16 NOTE — Progress Notes (Signed)
Subjective:    Patient ID: Dean Mcconnell, male    DOB: [birth year], 76 y.o.   MRN: [id number]   Chief Complaint: medical management of chronic issues     HPI:  Dean Mcconnell is a 76 y.o. who identifies as a male who was assigned male at birth.   Social history: Lives with: by hisself Work history: retired   Scientist, forensic in today for follow up of the following chronic medical issues:  1. Primary hypertension No c/o chest pain, sob or headache. Does not check blood pressure at home. BP Readings from Last 3 Encounters:  10/13/21 117/70  07/15/21 116/64  04/14/21 128/64     2. Mixed hyperlipidemia Does try to watch diet and works out with weights several times a week. Lab Results  Component Value Date   CHOL 119 10/13/2021   HDL 45 10/13/2021   LDLCALC 57 10/13/2021   TRIG 89 10/13/2021   CHOLHDL 2.6 10/13/2021   The ASCVD Risk score (Arnett DK, et al., 2019) failed to calculate for the following reasons:   The valid total cholesterol range is 130 to 320 mg/dL   3. Hypokalemia No c/o muscle cramps Lab Results  Component Value Date   K 3.9 10/13/2021     4. Major depressive disorder with single episode, in full remission ([place]) Is currently on no antidepressants.    04/16/2022   10:09 AM 10/13/2021    8:05 AM 07/15/2021    8:50 AM  Depression screen PHQ 2/9  Decreased Interest 0 0 0  Down, Depressed, Hopeless 0 0 0  PHQ - 2 Score 0 0 0  Altered sleeping 0 0 0  Tired, decreased energy 0 0 0  Change in appetite 0 0 0  Feeling bad or failure about yourself  0 0 0  Trouble concentrating 0 0 0  Moving slowly or fidgety/restless 0 0 0  Suicidal thoughts 0 0 0  PHQ-9 Score 0 0 0  Difficult doing work/chores Not difficult at all Not difficult at all Not difficult at all     5. Smoker Still smoking about a pack a day. He is really trying to cut back  6. BMI 28.0-28.9,adult Weight is down 3lbs Wt Readings from Last 3 Encounters:  04/16/22 205 lb (93 kg)  10/13/21  208 lb (94.3 kg)  07/15/21 204 lb (92.5 kg)   BMI Readings from Last 3 Encounters:  04/16/22 28.59 kg/m  10/13/21 29.01 kg/m  07/15/21 28.45 kg/m     New complaints: None today  Allergies  Allergen Reactions   Penicillins Swelling   Outpatient Encounter Medications as of 04/16/2022  Medication Sig   atorvastatin (LIPITOR) 40 MG tablet Take 1 tablet (40 mg total) by mouth daily.   cetirizine (ZYRTEC) 10 MG tablet Take 1 tablet (10 mg total) by mouth daily.   fluticasone (FLONASE) 50 MCG/ACT nasal spray Place 2 sprays into both nostrils daily.   hydrochlorothiazide (HYDRODIURIL) 25 MG tablet Take 1 tablet (25 mg total) by mouth daily.   losartan (COZAAR) 100 MG tablet Take 1 tablet (100 mg total) by mouth daily.   potassium chloride SA (KLOR-CON M) 20 MEQ tablet Take 1 tablet (20 mEq total) by mouth daily.   sildenafil (VIAGRA) 100 MG tablet 1 po prn   No facility-administered encounter medications on file as of 04/16/2022.    Past Surgical History:  Procedure Laterality Date   SKIN CANCER EXCISION     nose    Family History  Problem Relation Age  of Onset   Hypertension Father       Controlled substance contract: n/a'    Review of Systems  Constitutional:  Negative for diaphoresis.  Eyes:  Negative for pain.  Respiratory:  Negative for shortness of breath.   Cardiovascular:  Negative for chest pain, palpitations and leg swelling.  Gastrointestinal:  Negative for abdominal pain.  Endocrine: Negative for polydipsia.  Skin:  Negative for rash.  Neurological:  Negative for dizziness, weakness and headaches.  Hematological:  Does not bruise/bleed easily.  All other systems reviewed and are negative.      Objective:   Physical Exam Vitals and nursing note reviewed.  Constitutional:      Appearance: Normal appearance. He is well-developed.  HENT:     Head: Normocephalic.     Nose: Nose normal.     Mouth/Throat:     Mouth: Mucous membranes are moist.      Pharynx: Oropharynx is clear.  Eyes:     Pupils: Pupils are equal, round, and reactive to light.  Neck:     Thyroid: No thyroid mass or thyromegaly.     Vascular: No carotid bruit or JVD.     Trachea: Phonation normal.  Cardiovascular:     Rate and Rhythm: Normal rate and regular rhythm.  Pulmonary:     Effort: Pulmonary effort is normal. No respiratory distress.     Breath sounds: Normal breath sounds.  Abdominal:     General: Bowel sounds are normal.     Palpations: Abdomen is soft.     Tenderness: There is no abdominal tenderness.  Musculoskeletal:        General: Normal range of motion.     Cervical back: Normal range of motion and neck supple.  Lymphadenopathy:     Cervical: No cervical adenopathy.  Skin:    General: Skin is warm and dry.  Neurological:     Mental Status: He is alert and oriented to person, place, and time.  Psychiatric:        Behavior: Behavior normal.        Thought Content: Thought content normal.        Judgment: Judgment normal.    BP 111/66   Pulse 85   Temp (!) 97.1 F (36.2 C) (Temporal)   Resp 20   Ht 5' 11" (1.803 m)   Wt 205 lb (93 kg)   SpO2 98%   BMI 28.59 kg/m         Assessment & Plan:   Jotham Ahn comes in today with chief complaint of Medical Management of Chronic Issues   Diagnosis and orders addressed:  1. Primary hypertension Low sodium diet - hydrochlorothiazide (HYDRODIURIL) 25 MG tablet; Take 1 tablet (25 mg total) by mouth daily.  Dispense: 90 tablet; Refill: 1 - losartan (COZAAR) 100 MG tablet; Take 1 tablet (100 mg total) by mouth daily.  Dispense: 90 tablet; Refill: 1 - CBC with Differential/Platelet - CMP14+EGFR  2. Mixed hyperlipidemia Low fat diet - atorvastatin (LIPITOR) 40 MG tablet; Take 1 tablet (40 mg total) by mouth daily.  Dispense: 90 tablet; Refill: 1 - Lipid panel  3. Hypokalemia Labs pending - potassium chloride SA (KLOR-CON M) 20 MEQ tablet; Take 1 tablet (20 mEq total) by mouth  daily.  Dispense: 90 tablet; Refill: 1  4. Major depressive disorder with single episode, in full remission [facility]) Stress management  5. Smoker Smoking cessation encouraged  6. BMI 28.0-28.9,adult Discussed diet and exercise for person with BMI >25 Will  recheck weight in 3-6 months    Labs pending Health Maintenance reviewed Diet and exercise encouraged  Follow up plan: 6 months   Mary-Margaret Hassell Done, FNP

## 2022-04-16 NOTE — Patient Instructions (Signed)

## 2022-04-17 LAB — CBC WITH DIFFERENTIAL/PLATELET
Basophils Absolute: 0 10*3/uL (ref 0.0–0.2)
Basos: 1 %
EOS (ABSOLUTE): 0.2 10*3/uL (ref 0.0–0.4)
Eos: 2 %
Hematocrit: 43.5 % (ref 37.5–51.0)
Hemoglobin: 14.9 g/dL (ref 13.0–17.7)
Immature Grans (Abs): 0 10*3/uL (ref 0.0–0.1)
Immature Granulocytes: 0 %
Lymphocytes Absolute: 3 10*3/uL (ref 0.7–3.1)
Lymphs: 35 %
MCH: 32.1 pg (ref 26.6–33.0)
MCHC: 34.3 g/dL (ref 31.5–35.7)
MCV: 94 fL (ref 79–97)
Monocytes Absolute: 1 10*3/uL — ABNORMAL HIGH (ref 0.1–0.9)
Monocytes: 11 %
Neutrophils Absolute: 4.5 10*3/uL (ref 1.4–7.0)
Neutrophils: 51 %
Platelets: 174 10*3/uL (ref 150–450)
RBC: 4.64 x10E6/uL (ref 4.14–5.80)
RDW: 13.1 % (ref 11.6–15.4)
WBC: 8.8 10*3/uL (ref 3.4–10.8)

## 2022-04-17 LAB — CMP14+EGFR
ALT: 7 IU/L (ref 0–44)
AST: 11 IU/L (ref 0–40)
Albumin/Globulin Ratio: 1.6 (ref 1.2–2.2)
Albumin: 3.9 g/dL (ref 3.8–4.8)
Alkaline Phosphatase: 112 IU/L (ref 44–121)
BUN/Creatinine Ratio: 8 — ABNORMAL LOW (ref 10–24)
BUN: 17 mg/dL (ref 8–27)
Bilirubin Total: 0.8 mg/dL (ref 0.0–1.2)
CO2: 28 mmol/L (ref 20–29)
Calcium: 9.6 mg/dL (ref 8.6–10.2)
Chloride: 102 mmol/L (ref 96–106)
Creatinine, Ser: 2.01 mg/dL — ABNORMAL HIGH (ref 0.76–1.27)
Globulin, Total: 2.4 g/dL (ref 1.5–4.5)
Glucose: 89 mg/dL (ref 70–99)
Potassium: 4.3 mmol/L (ref 3.5–5.2)
Sodium: 144 mmol/L (ref 134–144)
Total Protein: 6.3 g/dL (ref 6.0–8.5)
eGFR: 34 mL/min/{1.73_m2} — ABNORMAL LOW (ref >59)

## 2022-04-17 LAB — LIPID PANEL
Chol/HDL Ratio: 2.7 ratio (ref 0.0–5.0)
Cholesterol, Total: 126 mg/dL (ref 100–199)
HDL: 47 mg/dL (ref >39)
LDL Chol Calc (NIH): 61 mg/dL (ref 0–99)
Triglycerides: 95 mg/dL (ref 0–149)
VLDL Cholesterol Cal: 18 mg/dL (ref 5–40)

## 2022-07-20 ENCOUNTER — Ambulatory Visit (INDEPENDENT_AMBULATORY_CARE_PROVIDER_SITE_OTHER): Payer: Medicare Other | Admitting: Nurse Practitioner

## 2022-07-20 ENCOUNTER — Encounter: Payer: Self-pay | Admitting: Nurse Practitioner

## 2022-07-20 ENCOUNTER — Ambulatory Visit (INDEPENDENT_AMBULATORY_CARE_PROVIDER_SITE_OTHER): Payer: Medicare Other

## 2022-07-20 VITALS — BP 113/72 | HR 93 | Temp 97.8°F | Resp 20 | Ht 71.0 in | Wt 197.0 lb

## 2022-07-20 DIAGNOSIS — Z6828 Body mass index (BMI) 28.0-28.9, adult: Secondary | ICD-10-CM

## 2022-07-20 DIAGNOSIS — J90 Pleural effusion, not elsewhere classified: Secondary | ICD-10-CM | POA: Diagnosis not present

## 2022-07-20 DIAGNOSIS — F17200 Nicotine dependence, unspecified, uncomplicated: Secondary | ICD-10-CM | POA: Diagnosis not present

## 2022-07-20 DIAGNOSIS — Z125 Encounter for screening for malignant neoplasm of prostate: Secondary | ICD-10-CM

## 2022-07-20 DIAGNOSIS — E782 Mixed hyperlipidemia: Secondary | ICD-10-CM

## 2022-07-20 DIAGNOSIS — F325 Major depressive disorder, single episode, in full remission: Secondary | ICD-10-CM | POA: Diagnosis not present

## 2022-07-20 DIAGNOSIS — E876 Hypokalemia: Secondary | ICD-10-CM | POA: Diagnosis not present

## 2022-07-20 DIAGNOSIS — I10 Essential (primary) hypertension: Secondary | ICD-10-CM | POA: Diagnosis not present

## 2022-07-20 NOTE — Progress Notes (Signed)
Subjective:    Patient ID: Dean Mcconnell, male    DOB: 07-25-1946, 76 y.o.   MRN: [id number]   Chief Complaint: medical management of chronic issues     HPI:  Dean Mcconnell is a 76 y.o. who identifies as a male who was assigned male at birth.   Social history: Lives with: by himself Work history: retired   Scientist, forensic in today for follow up of the following chronic medical issues:  1. Primary hypertension No c/o chest pain, sob or headache. Doe snot check blood pressure at home. BP Readings from Last 3 Encounters:  04/16/22 111/66  10/13/21 117/70  07/15/21 116/64     2. Mixed hyperlipidemia Does try to watch diet. Does do occasional dedicated exercise. Lab Results  Component Value Date   CHOL 126 04/16/2022   HDL 47 04/16/2022   LDLCALC 61 04/16/2022   TRIG 95 04/16/2022   CHOLHDL 2.7 04/16/2022     3. Hypokalemia No c/o muscle cramps Lab Results  Component Value Date   K 4.3 04/16/2022    4. Major depressive disorder with single episode, in full remission ([place]) On no antidepressants right now. Says he is doing well.    07/20/2022   10:27 AM 04/16/2022   10:09 AM 10/13/2021    8:05 AM  Depression screen PHQ 2/9  Decreased Interest 0 0 0  Down, Depressed, Hopeless 0 0 0  PHQ - 2 Score 0 0 0  Altered sleeping 0 0 0  Tired, decreased energy 0 0 0  Change in appetite 0 0 0  Feeling bad or failure about yourself  0 0 0  Trouble concentrating 0 0 0  Moving slowly or fidgety/restless 0 0 0  Suicidal thoughts 0 0 0  PHQ-9 Score 0 0 0  Difficult doing work/chores Not difficult at all Not difficult at all Not difficult at all       5. Smoker Smokes about 1/4-1/2 pack a day.  6. BMI 28.0-28.9,adult Weight down 8lbs Wt Readings from Last 3 Encounters:  07/20/22 197 lb (89.4 kg)  04/16/22 205 lb (93 kg)  10/13/21 208 lb (94.3 kg)   BMI Readings from Last 3 Encounters:  07/20/22 27.48 kg/m  04/16/22 28.59 kg/m  10/13/21 29.01 kg/m      New  complaints: None today  Allergies  Allergen Reactions   Penicillins Swelling   Outpatient Encounter Medications as of 07/20/2022  Medication Sig   atorvastatin (LIPITOR) 40 MG tablet Take 1 tablet (40 mg total) by mouth daily.   cetirizine (ZYRTEC) 10 MG tablet Take 1 tablet (10 mg total) by mouth daily.   fluticasone (FLONASE) 50 MCG/ACT nasal spray Place 2 sprays into both nostrils daily.   hydrochlorothiazide (HYDRODIURIL) 25 MG tablet Take 1 tablet (25 mg total) by mouth daily.   losartan (COZAAR) 100 MG tablet Take 1 tablet (100 mg total) by mouth daily.   potassium chloride SA (KLOR-CON M) 20 MEQ tablet Take 1 tablet (20 mEq total) by mouth daily.   sildenafil (VIAGRA) 100 MG tablet 1 po prn   No facility-administered encounter medications on file as of 07/20/2022.    Past Surgical History:  Procedure Laterality Date   SKIN CANCER EXCISION     nose    Family History  Problem Relation Age of Onset   Hypertension Father       Controlled substance contract: n/a     Review of Systems  Constitutional:  Negative for diaphoresis.  Eyes:  Negative for pain.  Respiratory:  Negative for shortness of breath.   Cardiovascular:  Negative for chest pain, palpitations and leg swelling.  Gastrointestinal:  Negative for abdominal pain.  Endocrine: Negative for polydipsia.  Skin:  Negative for rash.  Neurological:  Negative for dizziness, weakness and headaches.  Hematological:  Does not bruise/bleed easily.  All other systems reviewed and are negative.      Objective:   Physical Exam Vitals and nursing note reviewed.  Constitutional:      Appearance: Normal appearance. He is well-developed.  HENT:     Head: Normocephalic.     Nose: Nose normal.     Mouth/Throat:     Mouth: Mucous membranes are moist.     Pharynx: Oropharynx is clear.  Eyes:     Pupils: Pupils are equal, round, and reactive to light.  Neck:     Thyroid: No thyroid mass or thyromegaly.      Vascular: No carotid bruit or JVD.     Trachea: Phonation normal.  Cardiovascular:     Rate and Rhythm: Normal rate and regular rhythm.  Pulmonary:     Effort: Pulmonary effort is normal. No respiratory distress.     Breath sounds: Normal breath sounds.  Abdominal:     General: Bowel sounds are normal.     Palpations: Abdomen is soft.     Tenderness: There is no abdominal tenderness.  Musculoskeletal:        General: Normal range of motion.     Cervical back: Normal range of motion and neck supple.  Lymphadenopathy:     Cervical: No cervical adenopathy.  Skin:    General: Skin is warm and dry.  Neurological:     Mental Status: He is alert and oriented to person, place, and time.  Psychiatric:        Behavior: Behavior normal.        Thought Content: Thought content normal.        Judgment: Judgment normal.    BP 113/72   Pulse 93   Temp 97.8 F (36.6 C) (Temporal)   Resp 20   Ht '5\' 11"'$  (1.803 m)   Wt 197 lb (89.4 kg)   SpO2 96%   BMI 27.48 kg/m   EKG with pvc Ophelia Shoulder, FNP  Chest xray clear      Assessment & Plan:   Dean Mcconnell comes in today with chief complaint of Medical Management of Chronic Issues   Diagnosis and orders addressed:  1. Primary hypertension Low sodium diet - DG Chest 2 View - EKG 12-Lead  2. Mixed hyperlipidemia Low fat diet  3. Hypokalemia Labs pending  4. Major depressive disorder with single episode, in full remission [facility]) Stress management  5. Smoker Smoking cessation encouraged  6. BMI 28.0-28.9,adult Discussed diet and exercise for person with BMI >25 Will recheck weight in 3-6 months    Labs pending Health Maintenance reviewed Diet and exercise encouraged  Follow up plan: 6 omnths   [place], FNP

## 2022-07-20 NOTE — Addendum Note (Signed)
Addended by: Rolena Infante on: 07/20/2022 11:07 AM   Modules accepted: Orders

## 2022-07-21 LAB — CBC WITH DIFFERENTIAL/PLATELET
Basophils Absolute: 0 10*3/uL (ref 0.0–0.2)
Basos: 0 %
EOS (ABSOLUTE): 0.1 10*3/uL (ref 0.0–0.4)
Eos: 1 %
Hematocrit: 42.1 % (ref 37.5–51.0)
Hemoglobin: 14.1 g/dL (ref 13.0–17.7)
Immature Grans (Abs): 0 10*3/uL (ref 0.0–0.1)
Immature Granulocytes: 0 %
Lymphocytes Absolute: 3.3 10*3/uL — ABNORMAL HIGH (ref 0.7–3.1)
Lymphs: 30 %
MCH: 32 pg (ref 26.6–33.0)
MCHC: 33.5 g/dL (ref 31.5–35.7)
MCV: 96 fL (ref 79–97)
Monocytes Absolute: 1.1 10*3/uL — ABNORMAL HIGH (ref 0.1–0.9)
Monocytes: 10 %
Neutrophils Absolute: 6.6 10*3/uL (ref 1.4–7.0)
Neutrophils: 59 %
Platelets: 162 10*3/uL (ref 150–450)
RBC: 4.41 x10E6/uL (ref 4.14–5.80)
RDW: 13 % (ref 11.6–15.4)
WBC: 11.2 10*3/uL — ABNORMAL HIGH (ref 3.4–10.8)

## 2022-07-21 LAB — CMP14+EGFR
ALT: 6 IU/L (ref 0–44)
AST: 12 IU/L (ref 0–40)
Albumin/Globulin Ratio: 2 (ref 1.2–2.2)
Albumin: 3.9 g/dL (ref 3.8–4.8)
Alkaline Phosphatase: 126 IU/L — ABNORMAL HIGH (ref 44–121)
BUN/Creatinine Ratio: 10 (ref 10–24)
BUN: 24 mg/dL (ref 8–27)
Bilirubin Total: 0.6 mg/dL (ref 0.0–1.2)
CO2: 22 mmol/L (ref 20–29)
Calcium: 9.4 mg/dL (ref 8.6–10.2)
Chloride: 102 mmol/L (ref 96–106)
Creatinine, Ser: 2.49 mg/dL — ABNORMAL HIGH (ref 0.76–1.27)
Globulin, Total: 2 g/dL (ref 1.5–4.5)
Glucose: 97 mg/dL (ref 70–99)
Potassium: 3.7 mmol/L (ref 3.5–5.2)
Sodium: 141 mmol/L (ref 134–144)
Total Protein: 5.9 g/dL — ABNORMAL LOW (ref 6.0–8.5)
eGFR: 26 mL/min/{1.73_m2} — ABNORMAL LOW (ref >59)

## 2022-07-21 LAB — LIPID PANEL
Chol/HDL Ratio: 2.6 ratio (ref 0.0–5.0)
Cholesterol, Total: 110 mg/dL (ref 100–199)
HDL: 43 mg/dL (ref >39)
LDL Chol Calc (NIH): 51 mg/dL (ref 0–99)
Triglycerides: 78 mg/dL (ref 0–149)
VLDL Cholesterol Cal: 16 mg/dL (ref 5–40)

## 2022-07-21 LAB — PSA, TOTAL AND FREE
PSA, Free Pct: 42.5 %
PSA, Free: 0.17 ng/mL
Prostate Specific Ag, Serum: 0.4 ng/mL (ref 0.0–4.0)

## 2022-08-24 ENCOUNTER — Ambulatory Visit (INDEPENDENT_AMBULATORY_CARE_PROVIDER_SITE_OTHER): Payer: Medicare Other

## 2022-08-24 VITALS — Ht 71.0 in | Wt 200.0 lb

## 2022-08-24 DIAGNOSIS — Z0000 Encounter for general adult medical examination without abnormal findings: Secondary | ICD-10-CM | POA: Diagnosis not present

## 2022-08-24 NOTE — Patient Instructions (Signed)
Dean Mcconnell , Thank you for taking time to come for your Medicare Wellness Visit. I appreciate your ongoing commitment to your health goals. Please review the following plan we discussed and let me know if I can assist you in the future.   These are the goals we discussed:  Goals      DIET - EAT MORE FRUITS AND VEGETABLES     DIET - INCREASE WATER INTAKE     Try to drink 6-8 glasses of water daily.     Exercise 3x per week (30 min per time)        This is a list of the screening recommended for you and due dates:  Health Maintenance  Topic Date Due   DTaP/Tdap/Td vaccine (2 - Tdap) 09/05/2019   COVID-19 Vaccine (6 - 2023-24 season) 04/03/2022   Cologuard (Stool DNA test)  10/14/2022*   Zoster (Shingles) Vaccine (1 of 2) 10/19/2022*   Flu Shot  11/01/2022*   Pneumonia Vaccine (1 - PCV) 07/21/2023*   Medicare Annual Wellness Visit  08/25/2023   Hepatitis C Screening: USPSTF Recommendation to screen - Ages 18-79 yo.  Completed   HPV Vaccine  Aged Out  *Topic was postponed. The date shown is not the original due date.    Advanced directives: Please bring a copy of your health care power of attorney and living will to the office to be added to your chart at your convenience.   Conditions/risks identified: Aim for 30 minutes of exercise or brisk walking, 6-8 glasses of water, and 5 servings of fruits and vegetables each day.   Next appointment: Follow up in one year for your annual wellness visit.   Preventive Care 68 Years and Older, Male  Preventive care refers to lifestyle choices and visits with your health care provider that can promote health and wellness. What does preventive care include? A yearly physical exam. This is also called an annual well check. Dental exams once or twice a year. Routine eye exams. Ask your health care provider how often you should have your eyes checked. Personal lifestyle choices, including: Daily care of your teeth and gums. Regular physical  activity. Eating a healthy diet. Avoiding tobacco and drug use. Limiting alcohol use. Practicing safe sex. Taking low doses of aspirin every day. Taking vitamin and mineral supplements as recommended by your health care provider. What happens during an annual well check? The services and screenings done by your health care provider during your annual well check will depend on your age, overall health, lifestyle risk factors, and family history of disease. Counseling  Your health care provider may ask you questions about your: Alcohol use. Tobacco use. Drug use. Emotional well-being. Home and relationship well-being. Sexual activity. Eating habits. History of falls. Memory and ability to understand (cognition). Work and work Statistician. Screening  You may have the following tests or measurements: Height, weight, and BMI. Blood pressure. Lipid and cholesterol levels. These may be checked every 5 years, or more frequently if you are over 43 years old. Skin check. Lung cancer screening. You may have this screening every year starting at age 10 if you have a 30-pack-year history of smoking and currently smoke or have quit within the past 15 years. Fecal occult blood test (FOBT) of the stool. You may have this test every year starting at age 92. Flexible sigmoidoscopy or colonoscopy. You may have a sigmoidoscopy every 5 years or a colonoscopy every 10 years starting at age 27. Prostate cancer screening. Recommendations  will vary depending on your family history and other risks. Hepatitis C blood test. Hepatitis B blood test. Sexually transmitted disease (STD) testing. Diabetes screening. This is done by checking your blood sugar (glucose) after you have not eaten for a while (fasting). You may have this done every 1-3 years. Abdominal aortic aneurysm (AAA) screening. You may need this if you are a current or former smoker. Osteoporosis. You may be screened starting at age 48 if you are  at high risk. Talk with your health care provider about your test results, treatment options, and if necessary, the need for more tests. Vaccines  Your health care provider may recommend certain vaccines, such as: Influenza vaccine. This is recommended every year. Tetanus, diphtheria, and acellular pertussis (Tdap, Td) vaccine. You may need a Td booster every 10 years. Zoster vaccine. You may need this after age 72. Pneumococcal 13-valent conjugate (PCV13) vaccine. One dose is recommended after age 45. Pneumococcal polysaccharide (PPSV23) vaccine. One dose is recommended after age 31. Talk to your health care provider about which screenings and vaccines you need and how often you need them. This information is not intended to replace advice given to you by your health care provider. Make sure you discuss any questions you have with your health care provider. Document Released: 08/16/2015 Document Revised: 04/08/2016 Document Reviewed: 05/21/2015 Elsevier Interactive Patient Education  2017 [place] Prevention in the Home Falls can cause injuries. They can happen to people of all ages. There are many things you can do to make your home safe and to help prevent falls. What can I do on the outside of my home? Regularly fix the edges of walkways and driveways and fix any cracks. Remove anything that might make you trip as you walk through a door, such as a raised step or threshold. Trim any bushes or trees on the path to your home. Use bright outdoor lighting. Clear any walking paths of anything that might make someone trip, such as rocks or tools. Regularly check to see if handrails are loose or broken. Make sure that both sides of any steps have handrails. Any raised decks and porches should have guardrails on the edges. Have any leaves, snow, or ice cleared regularly. Use sand or salt on walking paths during winter. Clean up any spills in your garage right away. This includes oil  or grease spills. What can I do in the bathroom? Use night lights. Install grab bars by the toilet and in the tub and shower. Do not use towel bars as grab bars. Use non-skid mats or decals in the tub or shower. If you need to sit down in the shower, use a plastic, non-slip stool. Keep the floor dry. Clean up any water that spills on the floor as soon as it happens. Remove soap buildup in the tub or shower regularly. Attach bath mats securely with double-sided non-slip rug tape. Do not have throw rugs and other things on the floor that can make you trip. What can I do in the bedroom? Use night lights. Make sure that you have a light by your bed that is easy to reach. Do not use any sheets or blankets that are too big for your bed. They should not hang down onto the floor. Have a firm chair that has side arms. You can use this for support while you get dressed. Do not have throw rugs and other things on the floor that can make you trip. What can I do  in the kitchen? Clean up any spills right away. Avoid walking on wet floors. Keep items that you use a lot in easy-to-reach places. If you need to reach something above you, use a strong step stool that has a grab bar. Keep electrical cords out of the way. Do not use floor polish or wax that makes floors slippery. If you must use wax, use non-skid floor wax. Do not have throw rugs and other things on the floor that can make you trip. What can I do with my stairs? Do not leave any items on the stairs. Make sure that there are handrails on both sides of the stairs and use them. Fix handrails that are broken or loose. Make sure that handrails are as long as the stairways. Check any carpeting to make sure that it is firmly attached to the stairs. Fix any carpet that is loose or worn. Avoid having throw rugs at the top or bottom of the stairs. If you do have throw rugs, attach them to the floor with carpet tape. Make sure that you have a light  switch at the top of the stairs and the bottom of the stairs. If you do not have them, ask someone to add them for you. What else can I do to help prevent falls? Wear shoes that: Do not have high heels. Have rubber bottoms. Are comfortable and fit you well. Are closed at the toe. Do not wear sandals. If you use a stepladder: Make sure that it is fully opened. Do not climb a closed stepladder. Make sure that both sides of the stepladder are locked into place. Ask someone to hold it for you, if possible. Clearly mark and make sure that you can see: Any grab bars or handrails. First and last steps. Where the edge of each step is. Use tools that help you move around (mobility aids) if they are needed. These include: Canes. Walkers. Scooters. Crutches. Turn on the lights when you go into a dark area. Replace any light bulbs as soon as they burn out. Set up your furniture so you have a clear path. Avoid moving your furniture around. If any of your floors are uneven, fix them. If there are any pets around you, be aware of where they are. Review your medicines with your doctor. Some medicines can make you feel dizzy. This can increase your chance of falling. Ask your doctor what other things that you can do to help prevent falls. This information is not intended to replace advice given to you by your health care provider. Make sure you discuss any questions you have with your health care provider. Document Released: 05/16/2009 Document Revised: 12/26/2015 Document Reviewed: 08/24/2014 Elsevier Interactive Patient Education  2017 [employer].

## 2022-08-24 NOTE — Progress Notes (Signed)
Subjective:   Dean Mcconnell is a 77 y.o. male who presents for Medicare Annual/Subsequent preventive examination. I connected with  Dean Mcconnell on 08/24/22 by a audio enabled telemedicine application and verified that I am speaking with the correct person using two identifiers.  Patient Location: Home  Provider Location: Home Office  I discussed the limitations of evaluation and management by telemedicine. The patient expressed understanding and agreed to proceed.  Review of Systems     Cardiac Risk Factors include: advanced age (>81mn, >>35women);male gender     Objective:    Today's Vitals   08/24/22 1455  Weight: 200 lb (90.7 kg)  Height: '5\' 11"'$  (1.803 m)   Body mass index is 27.89 kg/m.     08/24/2022    2:59 PM 06/11/2021   11:39 AM 04/16/2020   10:43 AM 04/14/2019   10:08 AM 04/08/2018   10:51 AM 01/20/2017    2:03 PM 11/29/2014   11:22 AM  Advanced Directives  Does Patient Have a Medical Advance Directive? Yes No No Yes Yes No No  Type of Dean Mcconnell   Dean Mcconnell    Does patient want to make changes to medical advance directive?    No - Patient declined No - Patient declined Yes (Dean Mcconnell - Information given)   Copy of [place] Chart? No - copy requested   No - copy requested No - copy requested    Would patient like information on creating a medical advance directive?  No - Patient declined No - Patient declined   Yes (Dean Mcconnell - Information given)     Current Medications (verified) Outpatient Encounter Medications as of 08/24/2022  Medication Sig   atorvastatin (LIPITOR) 40 MG tablet Take 1 tablet (40 mg total) by mouth daily.   cetirizine (ZYRTEC) 10 MG tablet Take 1 tablet (10 mg total) by mouth daily.   fluticasone (FLONASE) 50 MCG/ACT nasal spray Place 2 sprays into both  nostrils daily.   hydrochlorothiazide (HYDRODIURIL) 25 MG tablet Take 1 tablet (25 mg total) by mouth daily.   losartan (COZAAR) 100 MG tablet Take 1 tablet (100 mg total) by mouth daily.   potassium chloride SA (KLOR-CON M) 20 MEQ tablet Take 1 tablet (20 mEq total) by mouth daily.   sildenafil (VIAGRA) 100 MG tablet 1 po prn   No facility-administered encounter medications on file as of 08/24/2022.    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   Hyperlipidemia    Hypertension    Past Surgical History:  Procedure Laterality Date   SKIN CANCER EXCISION     nose   Family History  Problem Relation Age of Onset   Hypertension Father    Social History   Socioeconomic History   Marital status: Widowed    Spouse name: Not on file   Number of children: 1   Years of education: GED   Highest education level: GED or equivalent  Occupational History   Occupation: Retired     Comment: Dean Mcconnell  Tobacco Use   Smoking status: Light Smoker    Packs/day: 0.25    Years: 30.00    Total pack years: 7.50    Types: Cigarettes   Smokeless tobacco: Former    Types: Chew    Quit date: 04/13/2009  Vaping  Use   Vaping Use: Never used  Substance and Sexual Activity   Alcohol use: No   Drug use: No   Sexual activity: Not Currently  Other Topics Concern   Not on file  Social History Narrative   1 daughter, 2 step-son and 1 step-daughter   Social Determinants of Health   Financial Resource Strain: Low Risk  (08/24/2022)   Overall Financial Resource Strain (CARDIA)    Difficulty of Paying Living Expenses: Not hard at all  Food Insecurity: No Food Insecurity (08/24/2022)   Hunger Vital Sign    Worried About Running Out of Food in the Last Year: Never true    Ran Out of Food in the Last Year: Never true  Transportation Needs: No Transportation Needs (08/24/2022)   PRAPARE -  Hydrologist (Medical): No    Lack of Transportation (Non-Medical): No  Physical Activity: Insufficiently Active (08/24/2022)   Exercise Vital Sign    Days of Exercise per Week: 3 days    Minutes of Exercise per Session: 30 min  Stress: No Stress Concern Present (08/24/2022)   Dean Mcconnell    Feeling of Stress : Not at all  Social Connections: Socially Isolated (08/24/2022)   Social Connection and Isolation Panel [NHANES]    Frequency of Communication with Friends and Family: More than three times a week    Frequency of Social Gatherings with Friends and Family: More than three times a week    Attends Religious Services: Never    Marine scientist or Organizations: No    Attends Archivist Meetings: Never    Marital Status: Widowed    Tobacco Counseling Ready to quit: No Counseling given: Not Answered   Clinical Intake:  Pre-visit preparation completed: Yes  Pain : No/denies pain     Nutritional Risks: None Diabetes: No  How often do you need to have someone help you when you read instructions, pamphlets, or other written materials from your doctor or pharmacy?: 1 - Never  Diabetic?no   Interpreter Needed?: No  Information entered by :: Dean Pierini, LPN   Activities of Daily Living    08/24/2022    2:59 PM  In your present state of health, do you have any difficulty performing the following activities:  Hearing? 0  Vision? 0  Difficulty concentrating or making decisions? 0  Walking or climbing stairs? 0  Dressing or bathing? 0  Doing errands, shopping? 0  Preparing Food and eating ? N  Using the Toilet? N  In the past six months, have you accidently leaked urine? N  Do you have problems with loss of bowel control? N  Managing your Medications? N  Managing your Finances? N  Housekeeping or managing your Housekeeping? N    Patient Care Team: Dean Pretty, FNP as PCP - General (Nurse Practitioner) Dean Labs, MD as Referring Physician (Optometry)  Indicate any recent Medical Services you may have received from other than [facility] providers in the past year (date may be approximate).     Assessment:   This is a routine wellness examination for [name].  Hearing/Vision screen Vision Screening - Comments:: Wears rx glasses - up to date with routine eye exams with  Dean Mcconnell   Dietary issues and exercise activities discussed: Current Exercise Habits: Home exercise routine, Type of exercise: strength training/weights, Time (Minutes): 30, Frequency (Times/Week): 3, Weekly Exercise (Minutes/Week): 90, Intensity: Mild, Exercise limited by:  None identified   Goals Addressed             This Visit's Progress    DIET - EAT MORE FRUITS AND VEGETABLES   On track    DIET - INCREASE WATER INTAKE   On track    Try to drink 6-8 glasses of water daily.     Exercise 3x per week (30 min per time)   On track      Depression Screen    08/24/2022    2:58 PM 07/20/2022   10:27 AM 04/16/2022   10:09 AM 10/13/2021    8:05 AM 07/15/2021    8:50 AM 06/11/2021   11:35 AM 04/14/2021   11:51 AM  PHQ 2/9 Scores  PHQ - 2 Score 0 0 0 0 0 0 0  PHQ- 9 Score 0 0 0 0 0 0 0    Fall Risk    08/24/2022    2:57 PM 07/20/2022   10:27 AM 04/16/2022   10:09 AM 10/13/2021    8:05 AM 07/15/2021    8:50 AM  Fall Risk   Falls in the past year? 0 0 0 0 0  Number falls in past yr: 0      Injury with Fall? 0      Risk for fall due to : No Fall Risks      Follow up Falls prevention discussed        [place]:  Any stairs in or around the home? No  If so, are there any without handrails? No  Home free of loose throw rugs in walkways, pet beds, electrical cords, etc? Yes  Adequate lighting in your home to reduce risk of falls? Yes   ASSISTIVE DEVICES UTILIZED TO PREVENT FALLS:  Life alert? No  Use of a cane,  walker or w/c? No  Grab bars in the bathroom? Yes  Shower chair or bench in shower? Yes  Elevated toilet seat or a handicapped toilet? Yes       04/08/2018   10:54 AM 01/20/2017    2:06 PM 11/29/2014   11:34 AM  MMSE - Mini Mental State Exam  Orientation to time '5 5 5  '$ Orientation to Place '5 5 5  '$ Registration '3 3 3  '$ Attention/ Calculation '5 5 5  '$ Recall '2 2 3  '$ Language- name 2 objects '2 2 2  '$ Language- repeat '1 1 1  '$ Language- follow 3 step command '3 3 3  '$ Language- read & follow direction '1 1 1  '$ Write a sentence 1 0 1  Copy design 1 0 1  Total score '29 27 30        '$ 08/24/2022    3:00 PM 04/16/2020   10:39 AM 04/14/2019   10:14 AM  6CIT Screen  What Year? 0 points 0 points 0 points  What month? 0 points 0 points 0 points  What time? 0 points 0 points 0 points  Count back from 20 0 points 0 points 0 points  Months in reverse 0 points 2 points 0 points  Repeat phrase 0 points 2 points 2 points  Total Score 0 points 4 points 2 points    Immunizations Immunization History  Administered Date(s) Administered   Moderna Covid-19 Vaccine Bivalent Booster 30yr & up 05/06/2021   Moderna Sars-Covid-2 Vaccination 11/07/2019, 12/05/2019, 07/09/2020, 11/12/2020   Td 09/04/2009    TDAP status: Up to date  Flu Vaccine status: Up to date  Pneumococcal vaccine status:  Due, Education has been provided regarding the importance of this vaccine. Advised may receive this vaccine at local pharmacy or Health Dept. Aware to provide a copy of the vaccination record if obtained from local pharmacy or Health Dept. Verbalized acceptance and understanding.  Covid-19 vaccine status: Completed vaccines  Qualifies for Shingles Vaccine? Yes   Zostavax completed No   Shingrix Completed?: No.    Education has been provided regarding the importance of this vaccine. Patient has been advised to call insurance company to determine out of pocket expense if they have not yet received this vaccine. Advised may  also receive vaccine at local pharmacy or Health Dept. Verbalized acceptance and understanding.  Screening Tests Health Maintenance  Topic Date Due   DTaP/Tdap/Td (2 - Tdap) 09/05/2019   COVID-19 Vaccine (6 - 2023-24 season) 04/03/2022   Fecal DNA (Cologuard)  10/14/2022 (Originally 01/24/1992)   Zoster Vaccines- Shingrix (1 of 2) 10/19/2022 (Originally 01/23/1997)   INFLUENZA VACCINE  11/01/2022 (Originally 03/03/2022)   Pneumonia Vaccine 27+ Years old (1 - PCV) 07/21/2023 (Originally 01/23/1952)   Medicare Annual Wellness (AWV)  08/25/2023   Hepatitis C Screening  Completed   HPV VACCINES  Aged Out    Health Maintenance  Health Maintenance Due  Topic Date Due   DTaP/Tdap/Td (2 - Tdap) 09/05/2019   COVID-19 Vaccine (6 - 2023-24 season) 04/03/2022    Colorectal cancer screening: No longer required.   Lung Cancer Screening: (Low Dose CT Chest recommended if Age 43-80 years, 30 pack-year currently smoking OR have quit w/in 15years.) does not qualify.   Lung Cancer Screening Referral: n/a  Additional Screening:  Hepatitis C Screening: does not qualify;   Vision Screening: Recommended annual ophthalmology exams for early detection of glaucoma and other disorders of the eye. Is the patient up to date with their annual eye exam?  Yes  Who is the provider or what is the name of the office in which the patient attends annual eye exams? Dean Mcconnell  If pt is not established with a provider, would they like to be referred to a provider to establish care? No .   Dental Screening: Recommended annual dental exams for proper oral hygiene  Community Resource Referral / Chronic Care Management: CRR required this visit?  No   CCM required this visit?  No      Plan:     I have personally reviewed and noted the following in the patient's chart:   Medical and social history Use of alcohol, tobacco or illicit drugs  Current medications and supplements including opioid prescriptions.  Patient is not currently taking opioid prescriptions. Functional ability and status Nutritional status Physical activity Advanced directives List of other physicians Hospitalizations, surgeries, and ER visits in previous 12 months Vitals Screenings to include cognitive, depression, and falls Referrals and appointments  In addition, I have reviewed and discussed with patient certain preventive protocols, quality metrics, and best practice recommendations. A written personalized care plan for preventive services as well as general preventive health recommendations were provided to patient.     Daphane Shepherd, LPN   8/83/[id number]   Nurse Notes: Due pneumonia Vaccine

## 2022-10-15 ENCOUNTER — Encounter: Payer: Self-pay | Admitting: Nurse Practitioner

## 2022-10-15 ENCOUNTER — Ambulatory Visit: Payer: Medicare Other | Admitting: Nurse Practitioner

## 2022-10-15 ENCOUNTER — Ambulatory Visit (INDEPENDENT_AMBULATORY_CARE_PROVIDER_SITE_OTHER): Payer: Medicare Other | Admitting: Nurse Practitioner

## 2022-10-15 VITALS — BP 136/62 | HR 55 | Temp 97.4°F | Resp 20 | Ht 71.0 in | Wt 199.0 lb

## 2022-10-15 DIAGNOSIS — Z6828 Body mass index (BMI) 28.0-28.9, adult: Secondary | ICD-10-CM

## 2022-10-15 DIAGNOSIS — Z0001 Encounter for general adult medical examination with abnormal findings: Secondary | ICD-10-CM | POA: Diagnosis not present

## 2022-10-15 DIAGNOSIS — E876 Hypokalemia: Secondary | ICD-10-CM

## 2022-10-15 DIAGNOSIS — I498 Other specified cardiac arrhythmias: Secondary | ICD-10-CM

## 2022-10-15 DIAGNOSIS — Z0000 Encounter for general adult medical examination without abnormal findings: Secondary | ICD-10-CM

## 2022-10-15 DIAGNOSIS — F17200 Nicotine dependence, unspecified, uncomplicated: Secondary | ICD-10-CM | POA: Diagnosis not present

## 2022-10-15 DIAGNOSIS — E782 Mixed hyperlipidemia: Secondary | ICD-10-CM

## 2022-10-15 DIAGNOSIS — F325 Major depressive disorder, single episode, in full remission: Secondary | ICD-10-CM

## 2022-10-15 DIAGNOSIS — I10 Essential (primary) hypertension: Secondary | ICD-10-CM | POA: Diagnosis not present

## 2022-10-15 LAB — LIPID PANEL

## 2022-10-15 MED ORDER — POTASSIUM CHLORIDE CRYS ER 20 MEQ PO TBCR: 20.0000 meq | EXTENDED_RELEASE_TABLET | Freq: Every day | ORAL | 1 refills | Status: DC

## 2022-10-15 MED ORDER — ATORVASTATIN CALCIUM 40 MG PO TABS: 40.0000 mg | ORAL_TABLET | Freq: Every day | ORAL | 1 refills | Status: DC

## 2022-10-15 MED ORDER — HYDROCHLOROTHIAZIDE 25 MG PO TABS: 25.0000 mg | ORAL_TABLET | Freq: Every day | ORAL | 1 refills | Status: DC

## 2022-10-15 MED ORDER — LOSARTAN POTASSIUM 100 MG PO TABS: 100.0000 mg | ORAL_TABLET | Freq: Every day | ORAL | 1 refills | Status: DC

## 2022-10-15 NOTE — Progress Notes (Signed)
Subjective:    Patient ID: Dean Mcconnell, male    DOB: 12/16/1945, 77 y.o.   MRN: [id number]   Chief Complaint:  annual physical  HPI:  Dean Mcconnell is a 77 y.o. who identifies as a male who was assigned male at birth.   Social history: Lives with: by hisself Work history: retired   Scientist, forensic in today for follow up of the following chronic medical issues:  1. Primary hypertension No co chest pain, sob or headache. Does not check blood pressure at home. BP Readings from Last 3 Encounters:  10/15/22 136/62  07/20/22 113/72  04/16/22 111/66     2. Mixed hyperlipidemia Does watch diet and exercises several days a week. Lab Results  Component Value Date   CHOL 110 07/20/2022   HDL 43 07/20/2022   LDLCALC 51 07/20/2022   TRIG 78 07/20/2022   CHOLHDL 2.6 07/20/2022     3. Hypokalemia No c/o  muscle cramps Lab Results  Component Value Date   K 3.7 07/20/2022     4. Major depressive disorder with single episode, in full remission [facility]) Currently on no prescription meds    10/15/2022   10:25 AM 08/24/2022    2:58 PM 07/20/2022   10:27 AM  Depression screen PHQ 2/9  Decreased Interest 0 0 0  Down, Depressed, Hopeless 0 0 0  PHQ - 2 Score 0 0 0  Altered sleeping 0 0 0  Tired, decreased energy 0 0 0  Change in appetite 0 0 0  Feeling bad or failure about yourself  0 0 0  Trouble concentrating 0 0 0  Moving slowly or fidgety/restless 0 0 0  Suicidal thoughts 0 0 0  PHQ-9 Score 0 0 0  Difficult doing work/chores Not difficult at all Not difficult at all Not difficult at all     5. Smoker Smokes about 1/2 pack a day  6. BMI 28.0-28.9,adult No recent weight changes Wt Readings from Last 3 Encounters:  10/15/22 199 lb (90.3 kg)  08/24/22 200 lb (90.7 kg)  07/20/22 197 lb (89.4 kg)   BMI Readings from Last 3 Encounters:  10/15/22 27.75 kg/m  08/24/22 27.89 kg/m  07/20/22 27.48 kg/m       New complaints: None today  Allergies  Allergen Reactions    Penicillins Swelling   Outpatient Encounter Medications as of 10/15/2022  Medication Sig   atorvastatin (LIPITOR) 40 MG tablet Take 1 tablet (40 mg total) by mouth daily.   cetirizine (ZYRTEC) 10 MG tablet Take 1 tablet (10 mg total) by mouth daily.   fluticasone (FLONASE) 50 MCG/ACT nasal spray Place 2 sprays into both nostrils daily.   hydrochlorothiazide (HYDRODIURIL) 25 MG tablet Take 1 tablet (25 mg total) by mouth daily.   losartan (COZAAR) 100 MG tablet Take 1 tablet (100 mg total) by mouth daily.   potassium chloride SA (KLOR-CON M) 20 MEQ tablet Take 1 tablet (20 mEq total) by mouth daily.   sildenafil (VIAGRA) 100 MG tablet 1 po prn   No facility-administered encounter medications on file as of 10/15/2022.    Past Surgical History:  Procedure Laterality Date   SKIN CANCER EXCISION     nose    Family History  Problem Relation Age of Onset   Hypertension Father       Controlled substance contract: n/a     Review of Systems  Constitutional:  Negative for diaphoresis.  Eyes:  Negative for pain.  Respiratory:  Negative for shortness of breath.  Cardiovascular:  Negative for chest pain, palpitations and leg swelling.  Gastrointestinal:  Negative for abdominal pain.  Endocrine: Negative for polydipsia.  Skin:  Negative for rash.  Neurological:  Negative for dizziness, weakness and headaches.  Hematological:  Does not bruise/bleed easily.  All other systems reviewed and are negative.      Objective:   Physical Exam Vitals and nursing note reviewed.  Constitutional:      Appearance: Normal appearance. He is well-developed.  HENT:     Head: Normocephalic.     Nose: Nose normal.     Mouth/Throat:     Mouth: Mucous membranes are moist.     Pharynx: Oropharynx is clear.  Eyes:     Pupils: Pupils are equal, round, and reactive to light.  Neck:     Thyroid: No thyroid mass or thyromegaly.     Vascular: No carotid bruit or JVD.     Trachea: Phonation normal.   Cardiovascular:     Rate and Rhythm: Normal rate and regular rhythm.  Pulmonary:     Effort: Pulmonary effort is normal. No respiratory distress.     Breath sounds: Normal breath sounds.  Abdominal:     General: Bowel sounds are normal.     Palpations: Abdomen is soft.     Tenderness: There is no abdominal tenderness.  Musculoskeletal:        General: Normal range of motion.     Cervical back: Normal range of motion and neck supple.  Lymphadenopathy:     Cervical: No cervical adenopathy.  Skin:    General: Skin is warm and dry.  Neurological:     Mental Status: He is alert and oriented to person, place, and time.  Psychiatric:        Behavior: Behavior normal.        Thought Content: Thought content normal.        Judgment: Judgment normal.    BP 136/62   Pulse (!) 55   Temp (!) 97.4 F (36.3 C) (Temporal)   Resp 20   Ht '5\' 11"'$  (1.803 m)   Wt 199 lb (90.3 kg)   SpO2 98%   BMI 27.75 kg/m   EKG- bigeminy-Mary-Margaret Hassell Done, FNP     Assessment & Plan:   Dean Mcconnell comes in today with chief complaint of Medical Management of Chronic Issues   Diagnosis and orders addressed:  1. Annual physical exam  - EKG 12-Lead  2. Primary hypertension Low sodium diet - hydrochlorothiazide (HYDRODIURIL) 25 MG tablet; Take 1 tablet (25 mg total) by mouth daily.  Dispense: 90 tablet; Refill: 1 - losartan (COZAAR) 100 MG tablet; Take 1 tablet (100 mg total) by mouth daily.  Dispense: 90 tablet; Refill: 1  3. Mixed hyperlipidemia Low fat diet - atorvastatin (LIPITOR) 40 MG tablet; Take 1 tablet (40 mg total) by mouth daily.  Dispense: 90 tablet; Refill: 1  4. Hypokalemia Labs pending - potassium chloride SA (KLOR-CON M) 20 MEQ tablet; Take 1 tablet (20 mEq total) by mouth daily.  Dispense: 90 tablet; Refill: 1  5. Major depressive disorder with single episode, in full remission [facility]) Stress management  6. Smoker Smoking cessation encouraged  7. BMI  28.0-28.9,adult Discussed diet and exercise for person with BMI >25 Will recheck weight in 3-6 months    Labs pending Health Maintenance reviewed Diet and exercise encouraged  Follow up plan: 3 months   Mary-Margaret Hassell Done, FNP

## 2022-10-16 LAB — CBC WITH DIFFERENTIAL/PLATELET
Basophils Absolute: 0 10*3/uL (ref 0.0–0.2)
Basos: 1 %
EOS (ABSOLUTE): 0.2 10*3/uL (ref 0.0–0.4)
Eos: 2 %
Hematocrit: 41.9 % (ref 37.5–51.0)
Hemoglobin: 13.5 g/dL (ref 13.0–17.7)
Immature Grans (Abs): 0 10*3/uL (ref 0.0–0.1)
Immature Granulocytes: 0 %
Lymphocytes Absolute: 2.5 10*3/uL (ref 0.7–3.1)
Lymphs: 35 %
MCH: 31.2 pg (ref 26.6–33.0)
MCHC: 32.2 g/dL (ref 31.5–35.7)
MCV: 97 fL (ref 79–97)
Monocytes Absolute: 0.8 10*3/uL (ref 0.1–0.9)
Monocytes: 11 %
Neutrophils Absolute: 3.6 10*3/uL (ref 1.4–7.0)
Neutrophils: 51 %
Platelets: 138 10*3/uL — ABNORMAL LOW (ref 150–450)
RBC: 4.33 x10E6/uL (ref 4.14–5.80)
RDW: 13.2 % (ref 11.6–15.4)
WBC: 7 10*3/uL (ref 3.4–10.8)

## 2022-10-16 LAB — LIPID PANEL
Chol/HDL Ratio: 2.1 ratio (ref 0.0–5.0)
Cholesterol, Total: 97 mg/dL — ABNORMAL LOW (ref 100–199)
HDL: 47 mg/dL (ref >39)
LDL Chol Calc (NIH): 38 mg/dL (ref 0–99)
Triglycerides: 45 mg/dL (ref 0–149)
VLDL Cholesterol Cal: 12 mg/dL (ref 5–40)

## 2022-10-16 LAB — CMP14+EGFR
ALT: 9 IU/L (ref 0–44)
AST: 10 IU/L (ref 0–40)
Albumin/Globulin Ratio: 2.2 (ref 1.2–2.2)
Albumin: 3.8 g/dL (ref 3.8–4.8)
Alkaline Phosphatase: 97 IU/L (ref 44–121)
BUN/Creatinine Ratio: 11 (ref 10–24)
BUN: 20 mg/dL (ref 8–27)
Bilirubin Total: 0.5 mg/dL (ref 0.0–1.2)
CO2: 26 mmol/L (ref 20–29)
Calcium: 9.1 mg/dL (ref 8.6–10.2)
Chloride: 105 mmol/L (ref 96–106)
Creatinine, Ser: 1.75 mg/dL — ABNORMAL HIGH (ref 0.76–1.27)
Globulin, Total: 1.7 g/dL (ref 1.5–4.5)
Glucose: 98 mg/dL (ref 70–99)
Potassium: 4.1 mmol/L (ref 3.5–5.2)
Sodium: 144 mmol/L (ref 134–144)
Total Protein: 5.5 g/dL — ABNORMAL LOW (ref 6.0–8.5)
eGFR: 40 mL/min/{1.73_m2} — ABNORMAL LOW (ref >59)

## 2022-10-26 ENCOUNTER — Telehealth: Payer: Self-pay | Admitting: Nurse Practitioner

## 2022-10-26 NOTE — Telephone Encounter (Signed)
Contacted Tyreek Kennemer to schedule their annual wellness visit. Appointment made for 08/26/2023.  Thank you,  Colletta Maryland,  [facility] Direct Dial ??[phone]

## 2022-12-06 DIAGNOSIS — I498 Other specified cardiac arrhythmias: Secondary | ICD-10-CM | POA: Insufficient documentation

## 2022-12-06 NOTE — Progress Notes (Unsigned)
Cardiology Office Note   Date:  12/09/2022   ID:  Dean Mcconnell, DOB [birth year], MRN [id number]  PCP:  Bennie Pierini, FNP  Cardiologist:   None Referring:  Bennie Pierini, *  Chief Complaint  Patient presents with   Ventricular bigeminy      History of Present Illness: Dean Mcconnell is a 77 y.o. male who presents for evaluation of ventricular bigeminy.  He is referred by Daphine Deutscher, Mary-Margaret, *.       There is a EKG which was done on March 14.  There is significant baseline artifact making the underlying rhythm difficult to assess but it is probably sinus.  There is clearly ventricular bigeminy.  There is a right bundle branch block pattern.  He says he does not feel any of this.  He does all of his household work.  He vacuums.  He can drag the garbage cans up a hill.  He says that with this level of activity he denies any chest pressure, neck or arm discomfort.  He does not feel any palpitations.  He has no presyncope or syncope.  He said no weight gain or edema.  He has no shortness of breath, PND or orthopnea.  He has not had any prior cardiac testing or workup.   Past Medical History:  Diagnosis Date   Cancer (HCC)    melanoma on nose   Hyperlipidemia    Hypertension     Past Surgical History:  Procedure Laterality Date   SKIN CANCER EXCISION     nose     Current Outpatient Medications  Medication Sig Dispense Refill   atorvastatin (LIPITOR) 40 MG tablet Take 1 tablet (40 mg total) by mouth daily. 90 tablet 1   cetirizine (ZYRTEC) 10 MG tablet Take 1 tablet (10 mg total) by mouth daily. 30 tablet 5   hydrochlorothiazide (HYDRODIURIL) 25 MG tablet Take 1 tablet (25 mg total) by mouth daily. 90 tablet 1   losartan (COZAAR) 100 MG tablet Take 1 tablet (100 mg total) by mouth daily. 90 tablet 1   potassium chloride SA (KLOR-CON M) 20 MEQ tablet Take 1 tablet (20 mEq total) by mouth daily. 90 tablet 1   fluticasone (FLONASE) 50 MCG/ACT nasal spray Place 2  sprays into both nostrils daily. (Patient not taking: Reported on 12/09/2022) 16 g 6   No current facility-administered medications for this visit.    Allergies:   Penicillins    Social History:  The patient  reports that he has been smoking cigarettes. He has a 7.50 pack-year smoking history. He quit smokeless tobacco use about 13 years ago.  His smokeless tobacco use included chew. He reports that he does not drink alcohol and does not use drugs.   Family History:  The patient's family history includes Hypertension in his father.    ROS:  Please see the history of present illness.   Otherwise, review of systems are positive for none.   All other systems are reviewed and negative.    PHYSICAL EXAM: VS:  BP (!) 148/80   Pulse 76   Ht 5\' 11"  (1.803 m)   Wt 194 lb (88 kg)   BMI 27.06 kg/m  , BMI Body mass index is 27.06 kg/m. GENERAL:  Well appearing HEENT:  Pupils equal round and reactive, fundi not visualized, oral mucosa unremarkable NECK:  No jugular venous distention, waveform within normal limits, carotid upstroke brisk and symmetric, no bruits, no thyromegaly LYMPHATICS:  No cervical, inguinal adenopathy LUNGS:  Clear  to auscultation bilaterally BACK:  No CVA tenderness CHEST:  Unremarkable HEART:  PMI not displaced or sustained,S1 and S2 within normal limits, no S3, no S4, no clicks, no rubs, no murmurs ABD:  Flat, positive bowel sounds normal in frequency in pitch, no bruits, no rebound, no guarding, no midline pulsatile mass, no hepatomegaly, no splenomegaly EXT:  2 plus pulses throughout, no edema, no cyanosis no clubbing SKIN:  No rashes no nodules NEURO:  Cranial nerves II through XII grossly intact, motor grossly intact throughout PSYCH:  Cognitively intact, oriented to person place and time    EKG:  EKG is ordered today. The ekg ordered today demonstrates sinus rhythm, rate 78, right bundle branch block, left anterior fascicular block, premature ventricular  contractions   Recent Labs: 10/15/2022: ALT 9; BUN 20; Creatinine, Ser 1.75; Hemoglobin 13.5; Platelets 138; Potassium 4.1; Sodium 144    Lipid Panel    Component Value Date/Time   CHOL 97 (L) 10/15/2022 1101   CHOL 155 01/04/2013 1011   TRIG 45 10/15/2022 1101   TRIG 74 12/13/2014 0859   TRIG 153 (H) 01/04/2013 1011   HDL 47 10/15/2022 1101   HDL 42 12/13/2014 0859   HDL 30 (L) 01/04/2013 1011   CHOLHDL 2.1 10/15/2022 1101   LDLCALC 38 10/15/2022 1101   LDLCALC 70 01/19/2014 1142   LDLCALC 94 01/04/2013 1011      Wt Readings from Last 3 Encounters:  12/09/22 194 lb (88 kg)  10/15/22 199 lb (90.3 kg)  08/24/22 200 lb (90.7 kg)      Other studies Reviewed: Additional studies/ records that were reviewed today include: Labs,l, primary care records. Review of the above records demonstrates:  Please see elsewhere in the note.     ASSESSMENT AND PLAN:  Ventricular bigeminy: I am going to have him wear a 3-day monitor.  I do note the labs are unremarkable including electrolytes but he does need a TSH.   HTN: The blood pressure is mildly elevated.  I like him to keep a blood pressure diary and present these readings.  Dyslipidemia: LDL was 38 with an HDL of 47.  No change in therapy.  RBBB/LAFB: The patient has no symptoms of bradycardia arrhythmia.  We talked about presenting if he does develop syncope or lightheadedness or presyncope.  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  This can be followed by his primary provider.  Creatinine was 1.75.   Current medicines are reviewed at length with the patient today.  The patient does not have concerns regarding medicines.  The following changes have been made:  no change  Labs/ tests ordered today include:   Orders Placed This Encounter  Procedures   TSH   LONG TERM MONITOR (3-14 DAYS)   EKG 12-Lead     Disposition:   FU with me as needed after the Zio patch.     Signed, Rollene Rotunda, MD  12/09/2022 12:22  PM    [facility]

## 2022-12-09 ENCOUNTER — Encounter: Payer: Self-pay | Admitting: Cardiology

## 2022-12-09 ENCOUNTER — Other Ambulatory Visit: Payer: Medicare Other

## 2022-12-09 ENCOUNTER — Ambulatory Visit: Payer: Medicare Other | Admitting: Cardiology

## 2022-12-09 ENCOUNTER — Ambulatory Visit: Payer: Medicare Other | Attending: Cardiology

## 2022-12-09 ENCOUNTER — Other Ambulatory Visit: Payer: Self-pay | Admitting: *Deleted

## 2022-12-09 VITALS — BP 148/80 | HR 76 | Ht 71.0 in | Wt 194.0 lb

## 2022-12-09 DIAGNOSIS — I498 Other specified cardiac arrhythmias: Secondary | ICD-10-CM

## 2022-12-09 DIAGNOSIS — I10 Essential (primary) hypertension: Secondary | ICD-10-CM | POA: Diagnosis not present

## 2022-12-09 DIAGNOSIS — E785 Hyperlipidemia, unspecified: Secondary | ICD-10-CM

## 2022-12-09 NOTE — Patient Instructions (Signed)
Medication Instructions:  The current medical regimen is effective;  continue present plan and medications.  *If you need a refill on your cardiac medications before your next appointment, please call your pharmacy*  Lab Work: Please have blood work today (TSH)  If you have labs (blood work) drawn today and your tests are completely normal, you will receive your results only by: MyChart Message (if you have MyChart) OR A paper copy in the mail If you have any lab test that is abnormal or we need to change your treatment, we will call you to review the results.  Testing/Procedures: Dean Mcconnell- Long Term Monitor Instructions  Your physician has requested you wear a ZIO patch monitor for 14 days.  This is a single patch monitor. [employer] supplies one patch monitor per enrollment. Additional stickers are not available. Please do not apply patch if you will be having a Nuclear Stress Test,  Echocardiogram, Cardiac CT, MRI, or Chest Xray during the period you would be wearing the  monitor. The patch cannot be worn during these tests. You cannot remove and re-apply the  ZIO XT patch monitor.  Your ZIO patch monitor will be mailed 3 day USPS to your address on file. It may take 3-5 days  to receive your monitor after you have been enrolled.  Once you have received your monitor, please review the enclosed instructions. Your monitor  has already been registered assigning a specific monitor serial # to you.  Billing and Patient Assistance Program Information  We have supplied [employer] with any of your insurance information on file for billing purposes. [employer] offers a sliding scale Patient Assistance Program for patients that do not have  insurance, or whose insurance does not completely cover the cost of the ZIO monitor.  You must apply for the Patient Assistance Program to qualify for this discounted rate.  To apply, please call [employer] at [phone], select option 4, select option 2, ask to  apply for  Patient Assistance Program. Meredeth Ide will ask your household income, and how many people  are in your household. They will quote your out-of-pocket cost based on that information.  [employer] will also be able to set up a 63-month, interest-free payment plan if needed.  Applying the monitor   Shave hair from upper left chest.  Hold abrader disc by orange tab. Rub abrader in 40 strokes over the upper left chest as  indicated in your monitor instructions.  Clean area with 4 enclosed alcohol pads. Let dry.  Apply patch as indicated in monitor instructions. Patch will be placed under collarbone on left  side of chest with arrow pointing upward.  Rub patch adhesive wings for 2 minutes. Remove white label marked "1". Remove the white  label marked "2". Rub patch adhesive wings for 2 additional minutes.  While looking in a mirror, press and release button in center of patch. A small green light will  flash 3-4 times. This will be your only indicator that the monitor has been turned on.  Do not shower for the first 24 hours. You may shower after the first 24 hours.  Press the button if you feel a symptom. You will hear a small click. Record Date, Time and  Symptom in the Patient Logbook.  When you are ready to remove the patch, follow instructions on the last 2 pages of Patient  Logbook. Stick patch monitor onto the last page of Patient Logbook.  Place Patient Logbook in the blue and white box. Use locking  tab on box and tape box closed  securely. The blue and white box has prepaid postage on it. Please place it in the mailbox as  soon as possible. Your physician should have your test results approximately 7 days after the  monitor has been mailed back to [facility].  Call [facility] Customer Care at [phone] if you have questions regarding  your ZIO XT patch monitor. Call them immediately if you see an orange light blinking on your  monitor.  If your monitor falls off in  less than 4 days, contact our Monitor department at [phone].  If your monitor becomes loose or falls off after 4 days call [employer] at [phone] for  suggestions on securing your monitor  Follow-Up: At [facility], you and your health needs are our priority.  As part of our continuing mission to provide you with exceptional heart care, we have created designated Provider Care Teams.  These Care Teams include your primary Cardiologist (physician) and Advanced Practice Providers (APPs -  Physician Assistants and Nurse Practitioners) who all work together to provide you with the care you need, when you need it.  We recommend signing up for the patient portal called "MyChart".  Sign up information is provided on this After Visit Summary.  MyChart is used to connect with patients for Virtual Visits (Telemedicine).  Patients are able to view lab/test results, encounter notes, upcoming appointments, etc.  Non-urgent messages can be sent to your provider as well.   To learn more about what you can do with MyChart, go to [email or web address].    Your next appointment:   Follow up as needed with Dr Antoine Poche.

## 2022-12-09 NOTE — Progress Notes (Unsigned)
Enrolled patient for a 3 day Zio XT monitor to be mailed to patients home  

## 2022-12-10 LAB — TSH: TSH: 1.75 u[IU]/mL (ref 0.450–4.500)

## 2022-12-11 ENCOUNTER — Encounter: Payer: Self-pay | Admitting: *Deleted

## 2022-12-14 DIAGNOSIS — I498 Other specified cardiac arrhythmias: Secondary | ICD-10-CM

## 2022-12-23 DIAGNOSIS — I498 Other specified cardiac arrhythmias: Secondary | ICD-10-CM | POA: Diagnosis not present

## 2023-01-18 ENCOUNTER — Ambulatory Visit (INDEPENDENT_AMBULATORY_CARE_PROVIDER_SITE_OTHER): Payer: Medicare Other | Admitting: Nurse Practitioner

## 2023-01-18 ENCOUNTER — Encounter: Payer: Self-pay | Admitting: Nurse Practitioner

## 2023-01-18 VITALS — BP 119/69 | HR 74 | Temp 97.9°F | Resp 20 | Ht 71.0 in | Wt 188.0 lb

## 2023-01-18 DIAGNOSIS — E782 Mixed hyperlipidemia: Secondary | ICD-10-CM

## 2023-01-18 DIAGNOSIS — I10 Essential (primary) hypertension: Secondary | ICD-10-CM | POA: Diagnosis not present

## 2023-01-18 DIAGNOSIS — E876 Hypokalemia: Secondary | ICD-10-CM | POA: Diagnosis not present

## 2023-01-18 DIAGNOSIS — F17200 Nicotine dependence, unspecified, uncomplicated: Secondary | ICD-10-CM | POA: Diagnosis not present

## 2023-01-18 DIAGNOSIS — Z6828 Body mass index (BMI) 28.0-28.9, adult: Secondary | ICD-10-CM

## 2023-01-18 DIAGNOSIS — F325 Major depressive disorder, single episode, in full remission: Secondary | ICD-10-CM

## 2023-01-18 MED ORDER — LOSARTAN POTASSIUM 100 MG PO TABS: 100.0000 mg | ORAL_TABLET | Freq: Every day | ORAL | 1 refills | Status: DC

## 2023-01-18 MED ORDER — POTASSIUM CHLORIDE CRYS ER 20 MEQ PO TBCR: 20.0000 meq | EXTENDED_RELEASE_TABLET | Freq: Every day | ORAL | 1 refills | Status: DC

## 2023-01-18 MED ORDER — ATORVASTATIN CALCIUM 40 MG PO TABS
40.0000 mg | ORAL_TABLET | Freq: Every day | ORAL | 1 refills | Status: DC
Start: 2023-01-18 — End: 2023-07-26

## 2023-01-18 MED ORDER — HYDROCHLOROTHIAZIDE 25 MG PO TABS
25.0000 mg | ORAL_TABLET | Freq: Every day | ORAL | 1 refills | Status: DC
Start: 2023-01-18 — End: 2023-07-26

## 2023-01-18 NOTE — Patient Instructions (Signed)
Fall Prevention in the Home, Adult Falls can cause injuries and can happen to people of all ages. There are many things you can do to make your home safer and to help prevent falls. What actions can I take to prevent falls? General information Use good lighting in all rooms. Make sure to: Replace any light bulbs that burn out. Turn on the lights in dark areas and use night-lights. Keep items that you use often in easy-to-reach places. Lower the shelves around your home if needed. Move furniture so that there are clear paths around it. Do not use throw rugs or other things on the floor that can make you trip. If any of your floors are uneven, fix them. Add color or contrast paint or tape to clearly mark and help you see: Grab bars or handrails. First and last steps of staircases. Where the edge of each step is. If you use a ladder or stepladder: Make sure that it is fully opened. Do not climb a closed ladder. Make sure the sides of the ladder are locked in place. Have someone hold the ladder while you use it. Know where your pets are as you move through your home. What can I do in the bathroom?     Keep the floor dry. Clean up any water on the floor right away. Remove soap buildup in the bathtub or shower. Buildup makes bathtubs and showers slippery. Use non-skid mats or decals on the floor of the bathtub or shower. Attach bath mats securely with double-sided, non-slip rug tape. If you need to sit down in the shower, use a non-slip stool. Install grab bars by the toilet and in the bathtub and shower. Do not use towel bars as grab bars. What can I do in the bedroom? Make sure that you have a light by your bed that is easy to reach. Do not use any sheets or blankets on your bed that hang to the floor. Have a firm chair or bench with side arms that you can use for support when you get dressed. What can I do in the kitchen? Clean up any spills right away. If you need to reach something  above you, use a step stool with a grab bar. Keep electrical cords out of the way. Do not use floor polish or wax that makes floors slippery. What can I do with my stairs? Do not leave anything on the stairs. Make sure that you have a light switch at the top and the bottom of the stairs. Make sure that there are handrails on both sides of the stairs. Fix handrails that are broken or loose. Install non-slip stair treads on all your stairs if they do not have carpet. Avoid having throw rugs at the top or bottom of the stairs. Choose a carpet that does not hide the edge of the steps on the stairs. Make sure that the carpet is firmly attached to the stairs. Fix carpet that is loose or worn. What can I do on the outside of my home? Use bright outdoor lighting. Fix the edges of walkways and driveways and fix any cracks. Clear paths of anything that can make you trip, such as tools or rocks. Add color or contrast paint or tape to clearly mark and help you see anything that might make you trip as you walk through a door, such as a raised step or threshold. Trim any bushes or trees on paths to your home. Check to see if handrails are loose   or broken and that both sides of all steps have handrails. Install guardrails along the edges of any raised decks and porches. Have leaves, snow, or ice cleared regularly. Use sand, salt, or ice melter on paths if you live where there is ice and snow during the winter. Clean up any spills in your garage right away. This includes grease or oil spills. What other actions can I take? Review your medicines with your doctor. Some medicines can cause dizziness or changes in blood pressure, which increase your risk of falling. Wear shoes that: Have a low heel. Do not wear high heels. Have rubber bottoms and are closed at the toe. Feel good on your feet and fit well. Use tools that help you move around if needed. These include: Canes. Walkers. Scooters. Crutches. Ask  your doctor what else you can do to help prevent falls. This may include seeing a physical therapist to learn to do exercises to move better and get stronger. Where to find more information Centers for Disease Control and Prevention, [employer]: cdc.gov National Institute on Aging: nia.nih.gov National Institute on Aging: nia.nih.gov Contact a doctor if: You are afraid of falling at home. You feel weak, drowsy, or dizzy at home. You fall at home. Get help right away if you: Lose consciousness or have trouble moving after a fall. Have a fall that causes a head injury. These symptoms may be an emergency. Get help right away. Call 911. Do not wait to see if the symptoms will go away. Do not drive yourself to the hospital. This information is not intended to replace advice given to you by your health care provider. Make sure you discuss any questions you have with your health care provider. Document Revised: 03/23/2022 Document Reviewed: 03/23/2022 Elsevier Patient Education  2024 [employer].  

## 2023-01-18 NOTE — Progress Notes (Signed)
Subjective:    Patient ID: Dean Mcconnell, male    DOB: [birth year], 77 y.o.   MRN: [id number]   Chief Complaint: Medical Management of Chronic Issues    HPI:  Dean Mcconnell is a 77 y.o. who identifies as a male who was assigned male at birth.   Social history: Lives with: by hisself Work history: retired   Water engineer in today for follow up of the following chronic medical issues:  1. Primary hypertension No c/o chest pain, sob or headache. Does not check blood pressure at home. BP Readings from Last 3 Encounters:  01/18/23 119/69  12/09/22 (!) 148/80  10/15/22 136/62     2. Mixed hyperlipidemia Does watch diet and exercises several days a week. Lab Results  Component Value Date   CHOL 97 (L) 10/15/2022   HDL 47 10/15/2022   LDLCALC 38 10/15/2022   TRIG 45 10/15/2022   CHOLHDL 2.1 10/15/2022     3. Hypokalemia No c/o muscle cramps Lab Results  Component Value Date   K 4.1 10/15/2022   The ASCVD Risk score (Arnett DK, et al., 2019) failed to calculate for the following reasons:   The valid total cholesterol range is 130 to 320 mg/dL   4. Major depressive disorder with single episode, in full remission (HCC) Is doing well. On no antidepressants    01/18/2023   11:12 AM 10/15/2022   10:25 AM 08/24/2022    2:58 PM  Depression screen PHQ 2/9  Decreased Interest 0 0 0  Down, Depressed, Hopeless 0 0 0  PHQ - 2 Score 0 0 0  Altered sleeping 0 0 0  Tired, decreased energy 0 0 0  Change in appetite 0 0 0  Feeling bad or failure about yourself  0 0 0  Trouble concentrating 0 0 0  Moving slowly or fidgety/restless 0 0 0  Suicidal thoughts 0 0 0  PHQ-9 Score 0 0 0  Difficult doing work/chores Not difficult at all Not difficult at all Not difficult at all     5. Smoker Is cutting back  6. BMI 28.0-28.9,adult Weight is down 6 lbs Wt Readings from Last 3 Encounters:  01/18/23 188 lb (85.3 kg)  12/09/22 194 lb (88 kg)  10/15/22 199 lb (90.3 kg)   BMI Readings  from Last 3 Encounters:  01/18/23 26.22 kg/m  12/09/22 27.06 kg/m  10/15/22 27.75 kg/m      New complaints: None today  Allergies  Allergen Reactions   Penicillins Swelling   Outpatient Encounter Medications as of 01/18/2023  Medication Sig   atorvastatin (LIPITOR) 40 MG tablet Take 1 tablet (40 mg total) by mouth daily.   cetirizine (ZYRTEC) 10 MG tablet Take 1 tablet (10 mg total) by mouth daily.   fluticasone (FLONASE) 50 MCG/ACT nasal spray Place 2 sprays into both nostrils daily.   hydrochlorothiazide (HYDRODIURIL) 25 MG tablet Take 1 tablet (25 mg total) by mouth daily.   losartan (COZAAR) 100 MG tablet Take 1 tablet (100 mg total) by mouth daily.   potassium chloride SA (KLOR-CON M) 20 MEQ tablet Take 1 tablet (20 mEq total) by mouth daily.   No facility-administered encounter medications on file as of 01/18/2023.    Past Surgical History:  Procedure Laterality Date   SKIN CANCER EXCISION     nose    Family History  Problem Relation Age of Onset   Hypertension Father       Controlled substance contract: n/a     Review of Systems  Constitutional:  Negative for diaphoresis.  Eyes:  Negative for pain.  Respiratory:  Negative for shortness of breath.   Cardiovascular:  Negative for chest pain, palpitations and leg swelling.  Gastrointestinal:  Negative for abdominal pain.  Endocrine: Negative for polydipsia.  Skin:  Negative for rash.  Neurological:  Negative for dizziness, weakness and headaches.  Hematological:  Does not bruise/bleed easily.  All other systems reviewed and are negative.      Objective:   Physical Exam Vitals and nursing note reviewed.  Constitutional:      Appearance: Normal appearance. He is well-developed.  HENT:     Head: Normocephalic.     Nose: Nose normal.     Mouth/Throat:     Mouth: Mucous membranes are moist.     Pharynx: Oropharynx is clear.  Eyes:     Pupils: Pupils are equal, round, and reactive to light.   Neck:     Thyroid: No thyroid mass or thyromegaly.     Vascular: No carotid bruit or JVD.     Trachea: Phonation normal.  Cardiovascular:     Rate and Rhythm: Normal rate and regular rhythm.  Pulmonary:     Effort: Pulmonary effort is normal. No respiratory distress.     Breath sounds: Normal breath sounds.  Abdominal:     General: Bowel sounds are normal.     Palpations: Abdomen is soft.     Tenderness: There is no abdominal tenderness.  Musculoskeletal:        General: Normal range of motion.     Cervical back: Normal range of motion and neck supple.  Lymphadenopathy:     Cervical: No cervical adenopathy.  Skin:    General: Skin is warm and dry.  Neurological:     Mental Status: He is alert and oriented to person, place, and time.  Psychiatric:        Behavior: Behavior normal.        Thought Content: Thought content normal.        Judgment: Judgment normal.    BP 119/69   Pulse 74   Temp 97.9 F (36.6 C) (Temporal)   Resp 20   Ht 5\' 11"  (1.803 m)   Wt 188 lb (85.3 kg)   SpO2 98%   BMI 26.22 kg/m         Assessment & Plan:  Dean Mcconnell comes in today with chief complaint of Medical Management of Chronic Issues   Diagnosis and orders addressed:  1. Primary hypertension Low sodium diet - hydrochlorothiazide (HYDRODIURIL) 25 MG tablet; Take 1 tablet (25 mg total) by mouth daily.  Dispense: 90 tablet; Refill: 1 - losartan (COZAAR) 100 MG tablet; Take 1 tablet (100 mg total) by mouth daily.  Dispense: 90 tablet; Refill: 1 - CBC with Differential/Platelet - CMP14+EGFR  2. Mixed hyperlipidemia Low fat diet - atorvastatin (LIPITOR) 40 MG tablet; Take 1 tablet (40 mg total) by mouth daily.  Dispense: 90 tablet; Refill: 1 - Lipid panel  3. Hypokalemia Labs pending - potassium chloride SA (KLOR-CON M) 20 MEQ tablet; Take 1 tablet (20 mEq total) by mouth daily.  Dispense: 90 tablet; Refill: 1  4. Major depressive disorder with single episode, in full  remission [facility]) Stress management  5. Smoker Smoking cessation encouraged  6. BMI 28.0-28.9,adult Discussed diet and exercise for person with BMI >25 Will recheck weight in 3-6 months    Labs pending Health Maintenance reviewed Diet and exercise encouraged  Follow up plan: 6 months  Mary-Margaret Almarosa Bohac, FNP   

## 2023-01-19 LAB — CBC WITH DIFFERENTIAL/PLATELET
Basophils Absolute: 0 10*3/uL (ref 0.0–0.2)
Basos: 1 %
EOS (ABSOLUTE): 0.1 10*3/uL (ref 0.0–0.4)
Eos: 1 %
Hematocrit: 43.2 % (ref 37.5–51.0)
Hemoglobin: 14 g/dL (ref 13.0–17.7)
Immature Grans (Abs): 0 10*3/uL (ref 0.0–0.1)
Immature Granulocytes: 0 %
Lymphocytes Absolute: 2.6 10*3/uL (ref 0.7–3.1)
Lymphs: 30 %
MCH: 31 pg (ref 26.6–33.0)
MCHC: 32.4 g/dL (ref 31.5–35.7)
MCV: 96 fL (ref 79–97)
Monocytes Absolute: 0.8 10*3/uL (ref 0.1–0.9)
Monocytes: 10 %
Neutrophils Absolute: 5 10*3/uL (ref 1.4–7.0)
Neutrophils: 58 %
Platelets: 146 10*3/uL — ABNORMAL LOW (ref 150–450)
RBC: 4.52 x10E6/uL (ref 4.14–5.80)
RDW: 13.1 % (ref 11.6–15.4)
WBC: 8.6 10*3/uL (ref 3.4–10.8)

## 2023-01-19 LAB — CMP14+EGFR
ALT: 10 IU/L (ref 0–44)
AST: 14 IU/L (ref 0–40)
Albumin: 4 g/dL (ref 3.8–4.8)
Alkaline Phosphatase: 122 IU/L — ABNORMAL HIGH (ref 44–121)
BUN/Creatinine Ratio: 10 (ref 10–24)
BUN: 20 mg/dL (ref 8–27)
Bilirubin Total: 0.8 mg/dL (ref 0.0–1.2)
CO2: 27 mmol/L (ref 20–29)
Calcium: 9.2 mg/dL (ref 8.6–10.2)
Chloride: 101 mmol/L (ref 96–106)
Creatinine, Ser: 1.93 mg/dL — ABNORMAL HIGH (ref 0.76–1.27)
Globulin, Total: 2.1 g/dL (ref 1.5–4.5)
Glucose: 90 mg/dL (ref 70–99)
Potassium: 4.3 mmol/L (ref 3.5–5.2)
Sodium: 142 mmol/L (ref 134–144)
Total Protein: 6.1 g/dL (ref 6.0–8.5)
eGFR: 36 mL/min/{1.73_m2} — ABNORMAL LOW (ref >59)

## 2023-01-19 LAB — LIPID PANEL
Chol/HDL Ratio: 2.3 ratio (ref 0.0–5.0)
Cholesterol, Total: 106 mg/dL (ref 100–199)
HDL: 47 mg/dL (ref >39)
LDL Chol Calc (NIH): 42 mg/dL (ref 0–99)
Triglycerides: 83 mg/dL (ref 0–149)
VLDL Cholesterol Cal: 17 mg/dL (ref 5–40)

## 2023-07-26 ENCOUNTER — Ambulatory Visit (INDEPENDENT_AMBULATORY_CARE_PROVIDER_SITE_OTHER): Payer: 59 | Admitting: Nurse Practitioner

## 2023-07-26 ENCOUNTER — Encounter: Payer: Self-pay | Admitting: Nurse Practitioner

## 2023-07-26 VITALS — BP 131/65 | HR 69 | Temp 97.7°F | Ht 71.0 in | Wt 194.0 lb

## 2023-07-26 DIAGNOSIS — F17200 Nicotine dependence, unspecified, uncomplicated: Secondary | ICD-10-CM | POA: Diagnosis not present

## 2023-07-26 DIAGNOSIS — F325 Major depressive disorder, single episode, in full remission: Secondary | ICD-10-CM

## 2023-07-26 DIAGNOSIS — Z6828 Body mass index (BMI) 28.0-28.9, adult: Secondary | ICD-10-CM | POA: Diagnosis not present

## 2023-07-26 DIAGNOSIS — I10 Essential (primary) hypertension: Secondary | ICD-10-CM | POA: Diagnosis not present

## 2023-07-26 DIAGNOSIS — E876 Hypokalemia: Secondary | ICD-10-CM

## 2023-07-26 DIAGNOSIS — E782 Mixed hyperlipidemia: Secondary | ICD-10-CM | POA: Diagnosis not present

## 2023-07-26 LAB — LIPID PANEL

## 2023-07-26 MED ORDER — HYDROCHLOROTHIAZIDE 25 MG PO TABS: 25.0000 mg | ORAL_TABLET | Freq: Every day | ORAL | 1 refills | Status: DC

## 2023-07-26 MED ORDER — LOSARTAN POTASSIUM 100 MG PO TABS: 100.0000 mg | ORAL_TABLET | Freq: Every day | ORAL | 1 refills | Status: DC

## 2023-07-26 MED ORDER — ATORVASTATIN CALCIUM 40 MG PO TABS
40.0000 mg | ORAL_TABLET | Freq: Every day | ORAL | 1 refills | Status: AC
Start: 2023-07-26 — End: ?

## 2023-07-26 MED ORDER — POTASSIUM CHLORIDE CRYS ER 20 MEQ PO TBCR: 20.0000 meq | EXTENDED_RELEASE_TABLET | Freq: Every day | ORAL | 1 refills | Status: DC

## 2023-07-26 NOTE — Patient Instructions (Signed)

## 2023-07-26 NOTE — Addendum Note (Signed)
Addended by: Bennie Pierini on: 07/26/2023 10:02 AM   Modules accepted: Orders

## 2023-07-26 NOTE — Progress Notes (Signed)
Subjective:    Patient ID: Dean Mcconnell, male    DOB: Dec 31, 1945, 77 y.o.   MRN: [id number]   Chief Complaint: medical management of chronic issues     HPI:  Dean Mcconnell is a 77 y.o. who identifies as a male who was assigned male at birth.   Social history: Lives with: by hisself Work history: retired   Water engineer in today for follow up of the following chronic medical issues:  1. Primary hypertension No c/o chest pain, sob or headache. Does not check blood pressure at home. BP Readings from Last 3 Encounters:  01/18/23 119/69  12/09/22 (!) 148/80  10/15/22 136/62     2. Mixed hyperlipidemia Does watch diet and exercises several days a week. Lab Results  Component Value Date   CHOL 106 01/18/2023   HDL 47 01/18/2023   LDLCALC 42 01/18/2023   TRIG 83 01/18/2023   CHOLHDL 2.3 01/18/2023     3. Hypokalemia No c/o muscle cramps Lab Results  Component Value Date   K 4.3 01/18/2023      4. Major depressive disorder with single episode, in full remission (HCC) Is doing well. On no antidepressants    07/26/2023    9:48 AM 01/18/2023   11:12 AM 10/15/2022   10:25 AM  Depression screen PHQ 2/9  Decreased Interest 0 0 0  Down, Depressed, Hopeless 0 0 0  PHQ - 2 Score 0 0 0  Altered sleeping 0 0 0  Tired, decreased energy 0 0 0  Change in appetite 0 0 0  Feeling bad or failure about yourself  0 0 0  Trouble concentrating 0 0 0  Moving slowly or fidgety/restless 0 0 0  Suicidal thoughts 0 0 0  PHQ-9 Score 0 0 0  Difficult doing work/chores Not difficult at all Not difficult at all Not difficult at all      5. Smoker Is cutting back. Smokes about 3/4 of pack a day. Has not had low dose CT scan.   6. BMI 28.0-28.9,adult Weight is up 6lbs Wt Readings from Last 3 Encounters:  07/26/23 194 lb (88 kg)  01/18/23 188 lb (85.3 kg)  12/09/22 194 lb (88 kg)   BMI Readings from Last 3 Encounters:  07/26/23 27.06 kg/m  01/18/23 26.22 kg/m  12/09/22 27.06  kg/m         New complaints: None today  Allergies  Allergen Reactions   Penicillins Swelling   Outpatient Encounter Medications as of 07/26/2023  Medication Sig   atorvastatin (LIPITOR) 40 MG tablet Take 1 tablet (40 mg total) by mouth daily.   cetirizine (ZYRTEC) 10 MG tablet Take 1 tablet (10 mg total) by mouth daily.   fluticasone (FLONASE) 50 MCG/ACT nasal spray Place 2 sprays into both nostrils daily.   hydrochlorothiazide (HYDRODIURIL) 25 MG tablet Take 1 tablet (25 mg total) by mouth daily.   losartan (COZAAR) 100 MG tablet Take 1 tablet (100 mg total) by mouth daily.   potassium chloride SA (KLOR-CON M) 20 MEQ tablet Take 1 tablet (20 mEq total) by mouth daily.   No facility-administered encounter medications on file as of 07/26/2023.    Past Surgical History:  Procedure Laterality Date   SKIN CANCER EXCISION     nose    Family History  Problem Relation Age of Onset   Hypertension Father       Controlled substance contract: n/a     Review of Systems  Constitutional:  Negative for diaphoresis.  Eyes:  Negative for pain.  Respiratory:  Negative for shortness of breath.   Cardiovascular:  Negative for chest pain, palpitations and leg swelling.  Gastrointestinal:  Negative for abdominal pain.  Endocrine: Negative for polydipsia.  Skin:  Negative for rash.  Neurological:  Negative for dizziness, weakness and headaches.  Hematological:  Does not bruise/bleed easily.  All other systems reviewed and are negative.      Objective:   Physical Exam Vitals and nursing note reviewed.  Constitutional:      Appearance: Normal appearance. He is well-developed.  HENT:     Head: Normocephalic.     Nose: Nose normal.     Mouth/Throat:     Mouth: Mucous membranes are moist.     Pharynx: Oropharynx is clear.  Eyes:     Pupils: Pupils are equal, round, and reactive to light.  Neck:     Thyroid: No thyroid mass or thyromegaly.     Vascular: No carotid bruit  or JVD.     Trachea: Phonation normal.  Cardiovascular:     Rate and Rhythm: Normal rate and regular rhythm.  Pulmonary:     Effort: Pulmonary effort is normal. No respiratory distress.     Breath sounds: Normal breath sounds.  Abdominal:     General: Bowel sounds are normal.     Palpations: Abdomen is soft.     Tenderness: There is no abdominal tenderness.  Musculoskeletal:        General: Normal range of motion.     Cervical back: Normal range of motion and neck supple.  Lymphadenopathy:     Cervical: No cervical adenopathy.  Skin:    General: Skin is warm and dry.  Neurological:     Mental Status: He is alert and oriented to person, place, and time.  Psychiatric:        Behavior: Behavior normal.        Thought Content: Thought content normal.        Judgment: Judgment normal.     BP 131/65   Pulse 69   Temp 97.7 F (36.5 C) (Temporal)   Ht 5\' 11"  (1.803 m)   Wt 194 lb (88 kg)   SpO2 95%   BMI 27.06 kg/m         Assessment & Plan:  Dean Mcconnell comes in today with chief complaint of No chief complaint on file.   Diagnosis and orders addressed:  1. Primary hypertension Low sodium diet - hydrochlorothiazide (HYDRODIURIL) 25 MG tablet; Take 1 tablet (25 mg total) by mouth daily.  Dispense: 90 tablet; Refill: 1 - losartan (COZAAR) 100 MG tablet; Take 1 tablet (100 mg total) by mouth daily.  Dispense: 90 tablet; Refill: 1 - CBC with Differential/Platelet - CMP14+EGFR  2. Mixed hyperlipidemia Low fat diet - atorvastatin (LIPITOR) 40 MG tablet; Take 1 tablet (40 mg total) by mouth daily.  Dispense: 90 tablet; Refill: 1 - Lipid panel  3. Hypokalemia Labs pending - potassium chloride SA (KLOR-CON M) 20 MEQ tablet; Take 1 tablet (20 mEq total) by mouth daily.  Dispense: 90 tablet; Refill: 1  4. Major depressive disorder with single episode, in full remission [facility]) Stress management  5. Smoker Smoking cessation encouraged  6. BMI 28.0-28.9,adult Discussed  diet and exercise for person with BMI >25 Will recheck weight in 3-6 months    Labs pending Health Maintenance reviewed Diet and exercise encouraged  Follow up plan: 6 months   Mary-Margaret Daphine Deutscher, FNP

## 2023-07-27 LAB — CBC WITH DIFFERENTIAL/PLATELET
Basophils Absolute: 0 10*3/uL (ref 0.0–0.2)
Basos: 1 %
EOS (ABSOLUTE): 0.3 10*3/uL (ref 0.0–0.4)
Eos: 3 %
Hematocrit: 45.1 % (ref 37.5–51.0)
Hemoglobin: 14.7 g/dL (ref 13.0–17.7)
Immature Grans (Abs): 0 10*3/uL (ref 0.0–0.1)
Immature Granulocytes: 0 %
Lymphocytes Absolute: 3.2 10*3/uL — ABNORMAL HIGH (ref 0.7–3.1)
Lymphs: 37 %
MCH: 31.5 pg (ref 26.6–33.0)
MCHC: 32.6 g/dL (ref 31.5–35.7)
MCV: 97 fL (ref 79–97)
Monocytes Absolute: 0.8 10*3/uL (ref 0.1–0.9)
Monocytes: 9 %
Neutrophils Absolute: 4.4 10*3/uL (ref 1.4–7.0)
Neutrophils: 50 %
Platelets: 156 10*3/uL (ref 150–450)
RBC: 4.66 x10E6/uL (ref 4.14–5.80)
RDW: 13.2 % (ref 11.6–15.4)
WBC: 8.7 10*3/uL (ref 3.4–10.8)

## 2023-07-27 LAB — CMP14+EGFR
ALT: 10 IU/L (ref 0–44)
AST: 11 IU/L (ref 0–40)
Albumin: 4 g/dL (ref 3.8–4.8)
Alkaline Phosphatase: 110 IU/L (ref 44–121)
BUN/Creatinine Ratio: 10 (ref 10–24)
BUN: 18 mg/dL (ref 8–27)
Bilirubin Total: 0.6 mg/dL (ref 0.0–1.2)
CO2: 24 mmol/L (ref 20–29)
Calcium: 9.5 mg/dL (ref 8.6–10.2)
Chloride: 106 mmol/L (ref 96–106)
Creatinine, Ser: 1.75 mg/dL — ABNORMAL HIGH (ref 0.76–1.27)
Globulin, Total: 2.1 g/dL (ref 1.5–4.5)
Glucose: 93 mg/dL (ref 70–99)
Potassium: 4.9 mmol/L (ref 3.5–5.2)
Sodium: 143 mmol/L (ref 134–144)
Total Protein: 6.1 g/dL (ref 6.0–8.5)
eGFR: 40 mL/min/{1.73_m2} — ABNORMAL LOW (ref >59)

## 2023-07-27 LAB — LIPID PANEL
Cholesterol, Total: 127 mg/dL (ref 100–199)
HDL: 50 mg/dL (ref >39)
LDL CALC COMMENT:: 2.5 ratio (ref 0.0–5.0)
LDL Chol Calc (NIH): 59 mg/dL (ref 0–99)
Triglycerides: 97 mg/dL (ref 0–149)
VLDL Cholesterol Cal: 18 mg/dL (ref 5–40)

## 2023-08-05 ENCOUNTER — Encounter: Payer: Self-pay | Admitting: Nurse Practitioner

## 2023-08-26 ENCOUNTER — Ambulatory Visit: Payer: 59

## 2023-08-26 VITALS — Ht 71.0 in | Wt 194.0 lb

## 2023-08-26 DIAGNOSIS — Z0000 Encounter for general adult medical examination without abnormal findings: Secondary | ICD-10-CM | POA: Diagnosis not present

## 2023-08-26 NOTE — Patient Instructions (Signed)
Mr. Facenda , Thank you for taking time to come for your Medicare Wellness Visit. I appreciate your ongoing commitment to your health goals. Please review the following plan we discussed and let me know if I can assist you in the future.   Referrals/Orders/Follow-Ups/Clinician Recommendations: Aim for 30 minutes of exercise or brisk walking, 6-8 glasses of water, and 5 servings of fruits and vegetables each day.  This is a list of the screening recommended for you and due dates:  Health Maintenance  Topic Date Due   COVID-19 Vaccine (6 - 2024-25 season) 04/04/2023   Zoster (Shingles) Vaccine (1 of 2) 10/24/2023*   Flu Shot  11/01/2023*   DTaP/Tdap/Td vaccine (2 - Tdap) 07/25/2024*   Pneumonia Vaccine (1 of 2 - PCV) 07/25/2024*   Medicare Annual Wellness Visit  08/25/2024   Hepatitis C Screening  Completed   HPV Vaccine  Aged Out  *Topic was postponed. The date shown is not the original due date.    Advanced directives: (ACP Link)Information on Advanced Care Planning can be found at [facility] [place] Advance Health Care Directives Advance Health Care Directives ([email or web address]   Next Medicare Annual Wellness Visit scheduled for next year: Yes

## 2023-08-26 NOTE — Progress Notes (Signed)
Subjective:   Dean Mcconnell is a 78 y.o. male who presents for Medicare Annual/Subsequent preventive examination.  Visit Complete: Virtual I connected with  Coralie Keens on 08/26/23 by a audio enabled telemedicine application and verified that I am speaking with the correct person using two identifiers.  Patient Location: Home  Provider Location: Home Office  This patient declined Interactive audio and video telecommunications. Therefore the visit was completed with audio only.  I discussed the limitations of evaluation and management by telemedicine. The patient expressed understanding and agreed to proceed.  Vital Signs: Because this visit was a virtual/telehealth visit, some criteria may be missing or patient reported. Any vitals not documented were not able to be obtained and vitals that have been documented are patient reported.  Cardiac Risk Factors include: advanced age (>5men, >32 women);male gender;dyslipidemia;hypertension     Objective:    Today's Vitals   08/26/23 1449  Weight: 194 lb (88 kg)  Height: 5\' 11"  (1.803 m)   Body mass index is 27.06 kg/m.     08/26/2023    2:52 PM 08/24/2022    2:59 PM 06/11/2021   11:39 AM 04/16/2020   10:43 AM 04/14/2019   10:08 AM 04/08/2018   10:51 AM 01/20/2017    2:03 PM  Advanced Directives  Does Patient Have a Medical Advance Directive? No Yes No No Yes Yes No  Type of Special educational needs teacher of [place];Living will   Healthcare Power of [place];Living will Healthcare Power of [place];Living will   Does patient want to make changes to medical advance directive?     No - Patient declined No - Patient declined Yes (MAU/Ambulatory/Procedural Areas - Information given)  Copy of Healthcare Power of Attorney in Chart?  No - copy requested   No - copy requested No - copy requested   Would patient like information on creating a medical advance directive? Yes (MAU/Ambulatory/Procedural Areas - Information given)  No - Patient  declined No - Patient declined   Yes (MAU/Ambulatory/Procedural Areas - Information given)    Current Medications (verified) Outpatient Encounter Medications as of 08/26/2023  Medication Sig   atorvastatin (LIPITOR) 40 MG tablet Take 1 tablet (40 mg total) by mouth daily.   cetirizine (ZYRTEC) 10 MG tablet Take 1 tablet (10 mg total) by mouth daily.   fluticasone (FLONASE) 50 MCG/ACT nasal spray Place 2 sprays into both nostrils daily.   hydrochlorothiazide (HYDRODIURIL) 25 MG tablet Take 1 tablet (25 mg total) by mouth daily.   losartan (COZAAR) 100 MG tablet Take 1 tablet (100 mg total) by mouth daily.   potassium chloride SA (KLOR-CON M) 20 MEQ tablet Take 1 tablet (20 mEq total) by mouth daily.   No facility-administered encounter medications on file as of 08/26/2023.    Allergies (verified) Penicillins   History: Past Medical History:  Diagnosis Date   Cancer (HCC)    melanoma on nose   Hyperlipidemia    Hypertension    Past Surgical History:  Procedure Laterality Date   SKIN CANCER EXCISION     nose   Family History  Problem Relation Age of Onset   Hypertension Father    Social History   Socioeconomic History   Marital status: Widowed    Spouse name: Not on file   Number of children: 1   Years of education: GED   Highest education level: GED or equivalent  Occupational History   Occupation: Retired     Comment: Administrator, Civil Service  Tobacco  Use   Smoking status: Light Smoker    Current packs/day: 0.25    Average packs/day: 0.3 packs/day for 30.0 years (7.5 ttl pk-yrs)    Types: Cigarettes   Smokeless tobacco: Former    Types: Chew    Quit date: 04/13/2009  Vaping Use   Vaping status: Never Used  Substance and Sexual Activity   Alcohol use: No   Drug use: No   Sexual activity: Not Currently  Other Topics Concern   Not on file  Social History Narrative   1 daughter, 2 step-son and 1 step-daughter   Widower   Emergency planning/management officer, security    Social Drivers of Health   Financial Resource Strain: Low Risk  (08/26/2023)   Overall Financial Resource Strain (CARDIA)    Difficulty of Paying Living Expenses: Not hard at all  Food Insecurity: No Food Insecurity (08/26/2023)   Hunger Vital Sign    Worried About Running Out of Food in the Last Year: Never true    Ran Out of Food in the Last Year: Never true  Transportation Needs: No Transportation Needs (08/26/2023)   PRAPARE - Administrator, Civil Service (Medical): No    Lack of Transportation (Non-Medical): No  Physical Activity: Insufficiently Active (08/26/2023)   Exercise Vital Sign    Days of Exercise per Week: 3 days    Minutes of Exercise per Session: 30 min  Stress: No Stress Concern Present (08/26/2023)   [employer] of Occupational Health - Occupational Stress Questionnaire    Feeling of Stress : Not at all  Social Connections: Socially Isolated (08/26/2023)   Social Connection and Isolation Panel [NHANES]    Frequency of Communication with Friends and Family: More than three times a week    Frequency of Social Gatherings with Friends and Family: Three times a week    Attends Religious Services: Never    Active Member of Clubs or Organizations: No    Attends Banker Meetings: Never    Marital Status: Widowed    Tobacco Counseling Ready to quit: Not Answered Counseling given: Not Answered   Clinical Intake:  Pre-visit preparation completed: Yes  Pain : No/denies pain     Diabetes: No  How often do you need to have someone help you when you read instructions, pamphlets, or other written materials from your doctor or pharmacy?: 1 - Never  Interpreter Needed?: No  Information entered by :: Kandis Fantasia LPN   Activities of Daily Living    08/26/2023    2:50 PM  In your present state of health, do you have any difficulty performing the following activities:  Hearing? 0  Vision? 0  Difficulty concentrating or making  decisions? 0  Walking or climbing stairs? 0  Dressing or bathing? 0  Doing errands, shopping? 0  Preparing Food and eating ? N  Using the Toilet? N  In the past six months, have you accidently leaked urine? N  Do you have problems with loss of bowel control? N  Managing your Medications? N  Managing your Finances? N  Housekeeping or managing your Housekeeping? N    Patient Care Team: Bennie Pierini, FNP as PCP - General (Nurse Practitioner) Michaelle Copas, MD as Referring Physician (Optometry) [facility]  Indicate any recent Medical Services you may have received from other than [facility] providers in the past year (date may be approximate).     Assessment:   This is a routine wellness examination for  Ree Kida.  Hearing/Vision screen Hearing Screening - Comments:: Denies hearing difficulties   Vision Screening - Comments:: Wears rx glasses - up to date with routine eye exams with [facility]     Goals Addressed             This Visit's Progress    Remain active and independent        Depression Screen    08/26/2023    2:51 PM 07/26/2023    9:48 AM 01/18/2023   11:12 AM 10/15/2022   10:25 AM 08/24/2022    2:58 PM 07/20/2022   10:27 AM 04/16/2022   10:09 AM  PHQ 2/9 Scores  PHQ - 2 Score 0 0 0 0 0 0 0  PHQ- 9 Score 0 0 0 0 0 0 0    Fall Risk    08/26/2023    2:53 PM 07/26/2023    9:47 AM 01/18/2023   11:11 AM 10/15/2022   10:25 AM 08/24/2022    2:57 PM  Fall Risk   Falls in the past year? 0 0 0 0 0  Number falls in past yr: 0    0  Injury with Fall? 0    0  Risk for fall due to : No Fall Risks    No Fall Risks  Follow up Falls prevention discussed;Education provided;Falls evaluation completed    Falls prevention discussed    MEDICARE RISK AT HOME: Medicare Risk at Home Any stairs in or around the home?: No If so, are there any without handrails?: No Home free of loose throw rugs in walkways, pet beds, electrical cords,  etc?: Yes Adequate lighting in your home to reduce risk of falls?: Yes Life alert?: No Use of a cane, walker or w/c?: No Grab bars in the bathroom?: Yes Shower chair or bench in shower?: No Elevated toilet seat or a handicapped toilet?: Yes  TIMED UP AND GO:  Was the test performed?  No    Cognitive Function:    04/08/2018   10:54 AM 01/20/2017    2:06 PM 11/29/2014   11:34 AM  MMSE - Mini Mental State Exam  Orientation to time 5 5 5   Orientation to Place 5 5 5   Registration 3 3 3   Attention/ Calculation 5 5 5   Recall 2 2 3   Language- name 2 objects 2 2 2   Language- repeat 1 1 1   Language- follow 3 step command 3 3 3   Language- read & follow direction 1 1 1   Write a sentence 1 0 1  Copy design 1 0 1  Total score 29 27 30         08/26/2023    2:53 PM 08/24/2022    3:00 PM 04/16/2020   10:39 AM 04/14/2019   10:14 AM  6CIT Screen  What Year? 0 points 0 points 0 points 0 points  What month? 0 points 0 points 0 points 0 points  What time? 0 points 0 points 0 points 0 points  Count back from 20 0 points 0 points 0 points 0 points  Months in reverse 0 points 0 points 2 points 0 points  Repeat phrase 0 points 0 points 2 points 2 points  Total Score 0 points 0 points 4 points 2 points    Immunizations Immunization History  Administered Date(s) Administered   Moderna Covid-19 Vaccine Bivalent Booster 58yrs & up 05/06/2021   Moderna Sars-Covid-2 Vaccination 11/07/2019, 12/05/2019, 07/09/2020, 11/12/2020   Td 09/04/2009    TDAP status: Due, Education  has been provided regarding the importance of this vaccine. Advised may receive this vaccine at local pharmacy or Health Dept. Aware to provide a copy of the vaccination record if obtained from local pharmacy or Health Dept. Verbalized acceptance and understanding.  Flu Vaccine status: Declined, Education has been provided regarding the importance of this vaccine but patient still declined. Advised may receive this vaccine at local  pharmacy or Health Dept. Aware to provide a copy of the vaccination record if obtained from local pharmacy or Health Dept. Verbalized acceptance and understanding.  Pneumococcal vaccine status: Declined,  Education has been provided regarding the importance of this vaccine but patient still declined. Advised may receive this vaccine at local pharmacy or Health Dept. Aware to provide a copy of the vaccination record if obtained from local pharmacy or Health Dept. Verbalized acceptance and understanding.   Covid-19 vaccine status: Declined, Education has been provided regarding the importance of this vaccine but patient still declined. Advised may receive this vaccine at local pharmacy or Health Dept.or vaccine clinic. Aware to provide a copy of the vaccination record if obtained from local pharmacy or Health Dept. Verbalized acceptance and understanding.  Qualifies for Shingles Vaccine? Yes   Zostavax completed No   Shingrix Completed?: No.    Education has been provided regarding the importance of this vaccine. Patient has been advised to call insurance company to determine out of pocket expense if they have not yet received this vaccine. Advised may also receive vaccine at local pharmacy or Health Dept. Verbalized acceptance and understanding.  Screening Tests Health Maintenance  Topic Date Due   COVID-19 Vaccine (6 - 2024-25 season) 04/04/2023   Zoster Vaccines- Shingrix (1 of 2) 10/24/2023 (Originally 01/23/1997)   INFLUENZA VACCINE  11/01/2023 (Originally 03/04/2023)   DTaP/Tdap/Td (2 - Tdap) 07/25/2024 (Originally 09/05/2019)   Pneumonia Vaccine 35+ Years old (1 of 2 - PCV) 07/25/2024 (Originally 01/23/1952)   Medicare Annual Wellness (AWV)  08/25/2024   Hepatitis C Screening  Completed   HPV VACCINES  Aged Out    Health Maintenance  Health Maintenance Due  Topic Date Due   COVID-19 Vaccine (6 - 2024-25 season) 04/04/2023    Colorectal cancer screening: No longer required.   Lung  Cancer Screening: (Low Dose CT Chest recommended if Age 7-80 years, 20 pack-year currently smoking OR have quit w/in 15years.) does not qualify.   Lung Cancer Screening Referral: n/a  Additional Screening:  Hepatitis C Screening: does qualify; Completed 06/28/15  Vision Screening: Recommended annual ophthalmology exams for early detection of glaucoma and other disorders of the eye. Is the patient up to date with their annual eye exam?  Yes  Who is the provider or what is the name of the office in which the patient attends annual eye exams? [facility]  If pt is not established with a provider, would they like to be referred to a provider to establish care? No .   Dental Screening: Recommended annual dental exams for proper oral hygiene  Community Resource Referral / Chronic Care Management: CRR required this visit?  No   CCM required this visit?  No     Plan:     I have personally reviewed and noted the following in the patient's chart:   Medical and social history Use of alcohol, tobacco or illicit drugs  Current medications and supplements including opioid prescriptions. Patient is not currently taking opioid prescriptions. Functional ability and status Nutritional status Physical activity Advanced directives List of other physicians Hospitalizations, surgeries,  and ER visits in previous 12 months Vitals Screenings to include cognitive, depression, and falls Referrals and appointments  In addition, I have reviewed and discussed with patient certain preventive protocols, quality metrics, and best practice recommendations. A written personalized care plan for preventive services as well as general preventive health recommendations were provided to patient.     Kandis Fantasia [place], [place]   1/61/0960   After Visit Summary: (MyChart) Due to this being a telephonic visit, the after visit summary with patients personalized plan was offered to patient via MyChart    Nurse Notes: No concerns at this time

## 2024-01-18 ENCOUNTER — Encounter: Payer: Self-pay | Admitting: Nurse Practitioner

## 2024-01-18 ENCOUNTER — Ambulatory Visit: Payer: 59 | Admitting: Nurse Practitioner

## 2024-01-18 VITALS — BP 113/68 | HR 63 | Temp 97.5°F | Ht 71.0 in | Wt 195.0 lb

## 2024-01-18 DIAGNOSIS — Z6827 Body mass index (BMI) 27.0-27.9, adult: Secondary | ICD-10-CM

## 2024-01-18 DIAGNOSIS — I10 Essential (primary) hypertension: Secondary | ICD-10-CM | POA: Diagnosis not present

## 2024-01-18 DIAGNOSIS — E876 Hypokalemia: Secondary | ICD-10-CM

## 2024-01-18 DIAGNOSIS — E782 Mixed hyperlipidemia: Secondary | ICD-10-CM | POA: Diagnosis not present

## 2024-01-18 DIAGNOSIS — F325 Major depressive disorder, single episode, in full remission: Secondary | ICD-10-CM | POA: Diagnosis not present

## 2024-01-18 DIAGNOSIS — F17200 Nicotine dependence, unspecified, uncomplicated: Secondary | ICD-10-CM | POA: Diagnosis not present

## 2024-01-18 LAB — LIPID PANEL

## 2024-01-18 MED ORDER — HYDROCHLOROTHIAZIDE 25 MG PO TABS: 25.0000 mg | ORAL_TABLET | Freq: Every day | ORAL | 1 refills | Status: DC

## 2024-01-18 MED ORDER — ATORVASTATIN CALCIUM 40 MG PO TABS: 40.0000 mg | ORAL_TABLET | Freq: Every day | ORAL | 1 refills | Status: DC

## 2024-01-18 MED ORDER — LOSARTAN POTASSIUM 100 MG PO TABS: 100.0000 mg | ORAL_TABLET | Freq: Every day | ORAL | 1 refills | Status: DC

## 2024-01-18 MED ORDER — POTASSIUM CHLORIDE CRYS ER 20 MEQ PO TBCR: 20.0000 meq | EXTENDED_RELEASE_TABLET | Freq: Every day | ORAL | 1 refills | Status: DC

## 2024-01-18 NOTE — Addendum Note (Signed)
 Addended by: Kailan Carmen, MARY-MARGARET on: 01/18/2024 10:47 AM   Modules accepted: Orders

## 2024-01-18 NOTE — Progress Notes (Signed)
 Subjective:    Patient ID: Dean Mcconnell, male    DOB: 05-Oct-1945, 78 y.o.   MRN: [id number]   Chief Complaint: medical management of chronic issues     HPI:  Dean Mcconnell is a 78 y.o. who identifies as a male who was assigned male at birth.   Social history: Lives with: by hisself Work history: retired   Water engineer in today for follow up of the following chronic medical issues:  1. Primary hypertension No c/o chest pain, sob or headache. Does not check blood pressure at home. BP Readings from Last 3 Encounters:  07/26/23 131/65  01/18/23 119/69  12/09/22 (!) 148/80     2. Mixed hyperlipidemia Does watch diet and exercises several days a week. Lab Results  Component Value Date   CHOL 127 07/26/2023   HDL 50 07/26/2023   LDLCALC 59 07/26/2023   TRIG 97 07/26/2023   CHOLHDL 2.5 07/26/2023     3. Hypokalemia No c/o muscle cramps Lab Results  Component Value Date   K 4.9 07/26/2023      4. Major depressive disorder with single episode, in full remission (HCC) Is doing well. On no antidepressants    01/18/2024   10:30 AM 08/26/2023    2:51 PM 07/26/2023    9:48 AM  Depression screen PHQ 2/9  Decreased Interest 0 0 0  Down, Depressed, Hopeless 0 0 0  PHQ - 2 Score 0 0 0  Altered sleeping 0 0 0  Tired, decreased energy 0 0 0  Change in appetite 0 0 0  Feeling bad or failure about yourself  0 0 0  Trouble concentrating 0 0 0  Moving slowly or fidgety/restless 0 0 0  Suicidal thoughts 0 0 0  PHQ-9 Score 0 0 0  Difficult doing work/chores Not difficult at all Not difficult at all Not difficult at all       5. Smoker Is cutting back. Smokes about 3/4 of pack a day. Has not had low dose CT scan.   6. BMI 27.0-27.9,adult Weight is up 6lbs  Wt Readings from Last 3 Encounters:  01/18/24 195 lb (88.5 kg)  08/26/23 194 lb (88 kg)  07/26/23 194 lb (88 kg)   BMI Readings from Last 3 Encounters:  01/18/24 27.20 kg/m  08/26/23 27.06 kg/m  07/26/23 27.06  kg/m      New complaints: None today  Allergies  Allergen Reactions   Penicillins Swelling   Outpatient Encounter Medications as of 01/18/2024  Medication Sig   atorvastatin  (LIPITOR) 40 MG tablet Take 1 tablet (40 mg total) by mouth daily.   cetirizine  (ZYRTEC ) 10 MG tablet Take 1 tablet (10 mg total) by mouth daily.   fluticasone  (FLONASE ) 50 MCG/ACT nasal spray Place 2 sprays into both nostrils daily.   hydrochlorothiazide  (HYDRODIURIL ) 25 MG tablet Take 1 tablet (25 mg total) by mouth daily.   losartan  (COZAAR ) 100 MG tablet Take 1 tablet (100 mg total) by mouth daily.   potassium chloride  SA (KLOR-CON  M) 20 MEQ tablet Take 1 tablet (20 mEq total) by mouth daily.   No facility-administered encounter medications on file as of 01/18/2024.    Past Surgical History:  Procedure Laterality Date   SKIN CANCER EXCISION     nose    Family History  Problem Relation Age of Onset   Hypertension Father       Controlled substance contract: n/a     Review of Systems  Constitutional:  Negative for diaphoresis.  Eyes:  Negative for pain.  Respiratory:  Negative for shortness of breath.   Cardiovascular:  Negative for chest pain, palpitations and leg swelling.  Gastrointestinal:  Negative for abdominal pain.  Endocrine: Negative for polydipsia.  Skin:  Negative for rash.  Neurological:  Negative for dizziness, weakness and headaches.  Hematological:  Does not bruise/bleed easily.  All other systems reviewed and are negative.      Objective:   Physical Exam Vitals and nursing note reviewed.  Constitutional:      Appearance: Normal appearance. He is well-developed.  HENT:     Head: Normocephalic.     Nose: Nose normal.     Mouth/Throat:     Mouth: Mucous membranes are moist.     Pharynx: Oropharynx is clear.   Eyes:     Pupils: Pupils are equal, round, and reactive to light.   Neck:     Thyroid : No thyroid  mass or thyromegaly.     Vascular: No carotid bruit or  JVD.     Trachea: Phonation normal.   Cardiovascular:     Rate and Rhythm: Normal rate and regular rhythm.  Pulmonary:     Effort: Pulmonary effort is normal. No respiratory distress.     Breath sounds: Normal breath sounds.  Abdominal:     General: Bowel sounds are normal.     Palpations: Abdomen is soft.     Tenderness: There is no abdominal tenderness.   Musculoskeletal:        General: Normal range of motion.     Cervical back: Normal range of motion and neck supple.  Lymphadenopathy:     Cervical: No cervical adenopathy.   Skin:    General: Skin is warm and dry.   Neurological:     Mental Status: He is alert and oriented to person, place, and time.   Psychiatric:        Behavior: Behavior normal.        Thought Content: Thought content normal.        Judgment: Judgment normal.     BP 113/68   Pulse 63   Temp (!) 97.5 F (36.4 C) (Temporal)   Ht 5' 11 (1.803 m)   Wt 195 lb (88.5 kg)   SpO2 98%   BMI 27.20 kg/m          Assessment & Plan:  Dean Mcconnell comes in today with chief complaint of No chief complaint on file.   Diagnosis and orders addressed:  1. Primary hypertension Low sodium diet - hydrochlorothiazide  (HYDRODIURIL ) 25 MG tablet; Take 1 tablet (25 mg total) by mouth daily.  Dispense: 90 tablet; Refill: 1 - losartan  (COZAAR ) 100 MG tablet; Take 1 tablet (100 mg total) by mouth daily.  Dispense: 90 tablet; Refill: 1 - CBC with Differential/Platelet - CMP14+EGFR  2. Mixed hyperlipidemia Low fat diet - atorvastatin  (LIPITOR) 40 MG tablet; Take 1 tablet (40 mg total) by mouth daily.  Dispense: 90 tablet; Refill: 1 - Lipid panel  3. Hypokalemia Labs pending - potassium chloride  SA (KLOR-CON  M) 20 MEQ tablet; Take 1 tablet (20 mEq total) by mouth daily.  Dispense: 90 tablet; Refill: 1  4. Major depressive disorder with single episode, in full remission [facility]) Stress management  5. Smoker Smoking cessation encouraged Refuses low dose CT  scan for cancer screening  6. BMI 28.0-28.9,adult Discussed diet and exercise for person with BMI >25 Will recheck weight in 3-6 months    Labs pending Health Maintenance reviewed Diet and exercise encouraged  Follow  up plan: 6 months   Mary-Margaret Gaylyn Keas, FNP

## 2024-01-18 NOTE — Patient Instructions (Signed)

## 2024-01-19 LAB — CMP14+EGFR
ALT: 7 IU/L (ref 0–44)
AST: 18 IU/L (ref 0–40)
Albumin: 4.2 g/dL (ref 3.8–4.8)
Alkaline Phosphatase: 115 IU/L (ref 44–121)
BUN/Creatinine Ratio: 12 (ref 10–24)
BUN: 25 mg/dL (ref 8–27)
Bilirubin Total: 0.6 mg/dL (ref 0.0–1.2)
CO2: 23 mmol/L (ref 20–29)
Calcium: 9.6 mg/dL (ref 8.6–10.2)
Chloride: 105 mmol/L (ref 96–106)
Creatinine, Ser: 2.07 mg/dL — ABNORMAL HIGH (ref 0.76–1.27)
Globulin, Total: 2.4 g/dL (ref 1.5–4.5)
Glucose: 92 mg/dL (ref 70–99)
Potassium: 4.8 mmol/L (ref 3.5–5.2)
Sodium: 145 mmol/L — ABNORMAL HIGH (ref 134–144)
Total Protein: 6.6 g/dL (ref 6.0–8.5)
eGFR: 33 mL/min/{1.73_m2} — ABNORMAL LOW (ref >59)

## 2024-01-19 LAB — CBC WITH DIFFERENTIAL/PLATELET
Basophils Absolute: 0 10*3/uL (ref 0.0–0.2)
Basos: 0 %
EOS (ABSOLUTE): 0.3 10*3/uL (ref 0.0–0.4)
Eos: 3 %
Hematocrit: 46.3 % (ref 37.5–51.0)
Hemoglobin: 14.7 g/dL (ref 13.0–17.7)
Immature Grans (Abs): 0 10*3/uL (ref 0.0–0.1)
Immature Granulocytes: 0 %
Lymphocytes Absolute: 4.1 10*3/uL — ABNORMAL HIGH (ref 0.7–3.1)
Lymphs: 40 %
MCH: 31.4 pg (ref 26.6–33.0)
MCHC: 31.7 g/dL (ref 31.5–35.7)
MCV: 99 fL — ABNORMAL HIGH (ref 79–97)
Monocytes Absolute: 1 10*3/uL — ABNORMAL HIGH (ref 0.1–0.9)
Monocytes: 10 %
Neutrophils Absolute: 4.8 10*3/uL (ref 1.4–7.0)
Neutrophils: 47 %
Platelets: 144 10*3/uL — ABNORMAL LOW (ref 150–450)
RBC: 4.68 x10E6/uL (ref 4.14–5.80)
RDW: 13 % (ref 11.6–15.4)
WBC: 10.3 10*3/uL (ref 3.4–10.8)

## 2024-01-19 LAB — LIPID PANEL
Chol/HDL Ratio: 3 ratio (ref 0.0–5.0)
Cholesterol, Total: 134 mg/dL (ref 100–199)
HDL: 45 mg/dL (ref >39)
LDL Chol Calc (NIH): 68 mg/dL (ref 0–99)
Triglycerides: 113 mg/dL (ref 0–149)
VLDL Cholesterol Cal: 21 mg/dL (ref 5–40)

## 2024-01-20 ENCOUNTER — Ambulatory Visit: Payer: Self-pay | Admitting: Nurse Practitioner

## 2024-01-20 DIAGNOSIS — R748 Abnormal levels of other serum enzymes: Secondary | ICD-10-CM

## 2024-03-08 ENCOUNTER — Other Ambulatory Visit (HOSPITAL_COMMUNITY): Payer: Self-pay | Admitting: Nephrology

## 2024-03-08 DIAGNOSIS — N1832 Chronic kidney disease, stage 3b: Secondary | ICD-10-CM

## 2024-03-08 DIAGNOSIS — I10 Essential (primary) hypertension: Secondary | ICD-10-CM

## 2024-03-08 DIAGNOSIS — I129 Hypertensive chronic kidney disease with stage 1 through stage 4 chronic kidney disease, or unspecified chronic kidney disease: Secondary | ICD-10-CM | POA: Diagnosis not present

## 2024-03-08 DIAGNOSIS — E785 Hyperlipidemia, unspecified: Secondary | ICD-10-CM

## 2024-03-08 DIAGNOSIS — E876 Hypokalemia: Secondary | ICD-10-CM | POA: Diagnosis not present

## 2024-03-08 DIAGNOSIS — D696 Thrombocytopenia, unspecified: Secondary | ICD-10-CM | POA: Diagnosis not present

## 2024-04-01 ENCOUNTER — Inpatient Hospital Stay (HOSPITAL_COMMUNITY)
Admission: EM | Admit: 2024-04-01 | Discharge: 2024-04-03 | DRG: 871 | Disposition: E | Attending: Internal Medicine | Admitting: Internal Medicine

## 2024-04-01 ENCOUNTER — Emergency Department (HOSPITAL_COMMUNITY)

## 2024-04-01 DIAGNOSIS — J9602 Acute respiratory failure with hypercapnia: Secondary | ICD-10-CM | POA: Diagnosis not present

## 2024-04-01 DIAGNOSIS — I2489 Other forms of acute ischemic heart disease: Secondary | ICD-10-CM | POA: Diagnosis not present

## 2024-04-01 DIAGNOSIS — E782 Mixed hyperlipidemia: Secondary | ICD-10-CM | POA: Diagnosis present

## 2024-04-01 DIAGNOSIS — Z66 Do not resuscitate: Secondary | ICD-10-CM | POA: Diagnosis present

## 2024-04-01 DIAGNOSIS — Z79899 Other long term (current) drug therapy: Secondary | ICD-10-CM

## 2024-04-01 DIAGNOSIS — G931 Anoxic brain damage, not elsewhere classified: Secondary | ICD-10-CM | POA: Diagnosis present

## 2024-04-01 DIAGNOSIS — Z4682 Encounter for fitting and adjustment of non-vascular catheter: Secondary | ICD-10-CM | POA: Diagnosis not present

## 2024-04-01 DIAGNOSIS — G936 Cerebral edema: Secondary | ICD-10-CM | POA: Diagnosis present

## 2024-04-01 DIAGNOSIS — J189 Pneumonia, unspecified organism: Secondary | ICD-10-CM | POA: Diagnosis present

## 2024-04-01 DIAGNOSIS — R001 Bradycardia, unspecified: Secondary | ICD-10-CM | POA: Diagnosis not present

## 2024-04-01 DIAGNOSIS — K7200 Acute and subacute hepatic failure without coma: Secondary | ICD-10-CM | POA: Diagnosis not present

## 2024-04-01 DIAGNOSIS — I4901 Ventricular fibrillation: Secondary | ICD-10-CM | POA: Diagnosis not present

## 2024-04-01 DIAGNOSIS — D638 Anemia in other chronic diseases classified elsewhere: Secondary | ICD-10-CM | POA: Diagnosis present

## 2024-04-01 DIAGNOSIS — I700 Atherosclerosis of aorta: Secondary | ICD-10-CM | POA: Diagnosis present

## 2024-04-01 DIAGNOSIS — F329 Major depressive disorder, single episode, unspecified: Secondary | ICD-10-CM | POA: Diagnosis present

## 2024-04-01 DIAGNOSIS — N179 Acute kidney failure, unspecified: Secondary | ICD-10-CM | POA: Diagnosis present

## 2024-04-01 DIAGNOSIS — J9601 Acute respiratory failure with hypoxia: Secondary | ICD-10-CM | POA: Diagnosis present

## 2024-04-01 DIAGNOSIS — Z1152 Encounter for screening for COVID-19: Secondary | ICD-10-CM

## 2024-04-01 DIAGNOSIS — I444 Left anterior fascicular block: Secondary | ICD-10-CM | POA: Diagnosis present

## 2024-04-01 DIAGNOSIS — K567 Ileus, unspecified: Secondary | ICD-10-CM | POA: Diagnosis present

## 2024-04-01 DIAGNOSIS — K56600 Partial intestinal obstruction, unspecified as to cause: Secondary | ICD-10-CM | POA: Diagnosis present

## 2024-04-01 DIAGNOSIS — Z9889 Other specified postprocedural states: Secondary | ICD-10-CM

## 2024-04-01 DIAGNOSIS — F17210 Nicotine dependence, cigarettes, uncomplicated: Secondary | ICD-10-CM | POA: Diagnosis present

## 2024-04-01 DIAGNOSIS — R7401 Elevation of levels of liver transaminase levels: Secondary | ICD-10-CM

## 2024-04-01 DIAGNOSIS — G9341 Metabolic encephalopathy: Secondary | ICD-10-CM | POA: Diagnosis present

## 2024-04-01 DIAGNOSIS — R918 Other nonspecific abnormal finding of lung field: Secondary | ICD-10-CM | POA: Diagnosis not present

## 2024-04-01 DIAGNOSIS — S2243XA Multiple fractures of ribs, bilateral, initial encounter for closed fracture: Secondary | ICD-10-CM | POA: Diagnosis not present

## 2024-04-01 DIAGNOSIS — I462 Cardiac arrest due to underlying cardiac condition: Secondary | ICD-10-CM | POA: Diagnosis not present

## 2024-04-01 DIAGNOSIS — Z85828 Personal history of other malignant neoplasm of skin: Secondary | ICD-10-CM

## 2024-04-01 DIAGNOSIS — R680 Hypothermia, not associated with low environmental temperature: Secondary | ICD-10-CM | POA: Diagnosis present

## 2024-04-01 DIAGNOSIS — I472 Ventricular tachycardia, unspecified: Secondary | ICD-10-CM | POA: Diagnosis not present

## 2024-04-01 DIAGNOSIS — R6521 Severe sepsis with septic shock: Secondary | ICD-10-CM | POA: Diagnosis not present

## 2024-04-01 DIAGNOSIS — S2241XA Multiple fractures of ribs, right side, initial encounter for closed fracture: Secondary | ICD-10-CM | POA: Diagnosis not present

## 2024-04-01 DIAGNOSIS — D6489 Other specified anemias: Secondary | ICD-10-CM | POA: Diagnosis present

## 2024-04-01 DIAGNOSIS — Z880 Allergy status to penicillin: Secondary | ICD-10-CM

## 2024-04-01 DIAGNOSIS — K3189 Other diseases of stomach and duodenum: Secondary | ICD-10-CM | POA: Diagnosis not present

## 2024-04-01 DIAGNOSIS — R9082 White matter disease, unspecified: Secondary | ICD-10-CM | POA: Diagnosis not present

## 2024-04-01 DIAGNOSIS — R008 Other abnormalities of heart beat: Secondary | ICD-10-CM | POA: Diagnosis present

## 2024-04-01 DIAGNOSIS — E874 Mixed disorder of acid-base balance: Secondary | ICD-10-CM | POA: Diagnosis present

## 2024-04-01 DIAGNOSIS — S27892A Contusion of other specified intrathoracic organs, initial encounter: Secondary | ICD-10-CM | POA: Diagnosis not present

## 2024-04-01 DIAGNOSIS — I4729 Other ventricular tachycardia: Secondary | ICD-10-CM | POA: Diagnosis not present

## 2024-04-01 DIAGNOSIS — M96A3 Multiple fractures of ribs associated with chest compression and cardiopulmonary resuscitation: Secondary | ICD-10-CM | POA: Diagnosis not present

## 2024-04-01 DIAGNOSIS — R7989 Other specified abnormal findings of blood chemistry: Secondary | ICD-10-CM

## 2024-04-01 DIAGNOSIS — I4719 Other supraventricular tachycardia: Secondary | ICD-10-CM | POA: Diagnosis present

## 2024-04-01 DIAGNOSIS — Z515 Encounter for palliative care: Secondary | ICD-10-CM

## 2024-04-01 DIAGNOSIS — I469 Cardiac arrest, cause unspecified: Secondary | ICD-10-CM | POA: Diagnosis not present

## 2024-04-01 DIAGNOSIS — Z8249 Family history of ischemic heart disease and other diseases of the circulatory system: Secondary | ICD-10-CM

## 2024-04-01 DIAGNOSIS — N1832 Chronic kidney disease, stage 3b: Secondary | ICD-10-CM | POA: Diagnosis not present

## 2024-04-01 DIAGNOSIS — S2249XA Multiple fractures of ribs, unspecified side, initial encounter for closed fracture: Secondary | ICD-10-CM

## 2024-04-01 DIAGNOSIS — R404 Transient alteration of awareness: Secondary | ICD-10-CM | POA: Diagnosis not present

## 2024-04-01 DIAGNOSIS — R911 Solitary pulmonary nodule: Secondary | ICD-10-CM | POA: Diagnosis present

## 2024-04-01 DIAGNOSIS — J982 Interstitial emphysema: Secondary | ICD-10-CM | POA: Diagnosis present

## 2024-04-01 DIAGNOSIS — X58XXXA Exposure to other specified factors, initial encounter: Secondary | ICD-10-CM | POA: Diagnosis present

## 2024-04-01 DIAGNOSIS — I480 Paroxysmal atrial fibrillation: Secondary | ICD-10-CM | POA: Diagnosis present

## 2024-04-01 DIAGNOSIS — T68XXXA Hypothermia, initial encounter: Secondary | ICD-10-CM

## 2024-04-01 DIAGNOSIS — I129 Hypertensive chronic kidney disease with stage 1 through stage 4 chronic kidney disease, or unspecified chronic kidney disease: Secondary | ICD-10-CM | POA: Diagnosis not present

## 2024-04-01 DIAGNOSIS — A419 Sepsis, unspecified organism: Secondary | ICD-10-CM | POA: Diagnosis not present

## 2024-04-01 DIAGNOSIS — R000 Tachycardia, unspecified: Secondary | ICD-10-CM | POA: Diagnosis not present

## 2024-04-01 LAB — I-STAT CHEM 8, ED
BUN: 96 mg/dL — ABNORMAL HIGH (ref 8–23)
Calcium, Ion: 1.06 mmol/L — ABNORMAL LOW (ref 1.15–1.40)
Chloride: 113 mmol/L — ABNORMAL HIGH (ref 98–111)
Creatinine, Ser: 3.7 mg/dL — ABNORMAL HIGH (ref 0.61–1.24)
Glucose, Bld: 191 mg/dL — ABNORMAL HIGH (ref 70–99)
HCT: 34 % — ABNORMAL LOW (ref 39.0–52.0)
Hemoglobin: 11.6 g/dL — ABNORMAL LOW (ref 13.0–17.0)
Potassium: 4.3 mmol/L (ref 3.5–5.1)
Sodium: 147 mmol/L — ABNORMAL HIGH (ref 135–145)
TCO2: 17 mmol/L — ABNORMAL LOW (ref 22–32)

## 2024-04-01 MED ORDER — EPINEPHRINE 1 MG/10ML IJ SOSY
PREFILLED_SYRINGE | INTRAMUSCULAR | Status: AC
Start: 2024-04-01 — End: 2024-04-02
  Filled 2024-04-01: qty 10

## 2024-04-01 MED ORDER — NOREPINEPHRINE 4 MG/250ML-% IV SOLN
INTRAVENOUS | Status: AC
  Administered 2024-04-02: 30 ug/min
  Filled 2024-04-01: qty 250
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
## 2024-04-02 ENCOUNTER — Emergency Department (HOSPITAL_COMMUNITY)

## 2024-04-02 ENCOUNTER — Other Ambulatory Visit (HOSPITAL_COMMUNITY)

## 2024-04-02 ENCOUNTER — Encounter (HOSPITAL_COMMUNITY): Payer: Self-pay

## 2024-04-02 ENCOUNTER — Inpatient Hospital Stay (HOSPITAL_COMMUNITY)

## 2024-04-02 DIAGNOSIS — A419 Sepsis, unspecified organism: Principal | ICD-10-CM

## 2024-04-02 DIAGNOSIS — D638 Anemia in other chronic diseases classified elsewhere: Secondary | ICD-10-CM | POA: Diagnosis present

## 2024-04-02 DIAGNOSIS — J189 Pneumonia, unspecified organism: Secondary | ICD-10-CM

## 2024-04-02 DIAGNOSIS — X58XXXA Exposure to other specified factors, initial encounter: Secondary | ICD-10-CM | POA: Diagnosis present

## 2024-04-02 DIAGNOSIS — I469 Cardiac arrest, cause unspecified: Secondary | ICD-10-CM | POA: Diagnosis not present

## 2024-04-02 DIAGNOSIS — N179 Acute kidney failure, unspecified: Secondary | ICD-10-CM

## 2024-04-02 DIAGNOSIS — I4729 Other ventricular tachycardia: Secondary | ICD-10-CM | POA: Diagnosis present

## 2024-04-02 DIAGNOSIS — N1832 Chronic kidney disease, stage 3b: Secondary | ICD-10-CM | POA: Diagnosis present

## 2024-04-02 DIAGNOSIS — R918 Other nonspecific abnormal finding of lung field: Secondary | ICD-10-CM | POA: Diagnosis not present

## 2024-04-02 DIAGNOSIS — I959 Hypotension, unspecified: Secondary | ICD-10-CM | POA: Diagnosis not present

## 2024-04-02 DIAGNOSIS — I462 Cardiac arrest due to underlying cardiac condition: Secondary | ICD-10-CM | POA: Diagnosis present

## 2024-04-02 DIAGNOSIS — R9082 White matter disease, unspecified: Secondary | ICD-10-CM | POA: Diagnosis not present

## 2024-04-02 DIAGNOSIS — K567 Ileus, unspecified: Secondary | ICD-10-CM | POA: Diagnosis not present

## 2024-04-02 DIAGNOSIS — E8729 Other acidosis: Secondary | ICD-10-CM

## 2024-04-02 DIAGNOSIS — Z4682 Encounter for fitting and adjustment of non-vascular catheter: Secondary | ICD-10-CM | POA: Diagnosis not present

## 2024-04-02 DIAGNOSIS — J9601 Acute respiratory failure with hypoxia: Secondary | ICD-10-CM | POA: Diagnosis not present

## 2024-04-02 DIAGNOSIS — R6521 Severe sepsis with septic shock: Secondary | ICD-10-CM | POA: Diagnosis present

## 2024-04-02 DIAGNOSIS — Z515 Encounter for palliative care: Secondary | ICD-10-CM | POA: Diagnosis not present

## 2024-04-02 DIAGNOSIS — S2243XA Multiple fractures of ribs, bilateral, initial encounter for closed fracture: Secondary | ICD-10-CM | POA: Diagnosis not present

## 2024-04-02 DIAGNOSIS — I129 Hypertensive chronic kidney disease with stage 1 through stage 4 chronic kidney disease, or unspecified chronic kidney disease: Secondary | ICD-10-CM | POA: Diagnosis present

## 2024-04-02 DIAGNOSIS — G9341 Metabolic encephalopathy: Secondary | ICD-10-CM | POA: Diagnosis present

## 2024-04-02 DIAGNOSIS — M96A3 Multiple fractures of ribs associated with chest compression and cardiopulmonary resuscitation: Secondary | ICD-10-CM | POA: Diagnosis present

## 2024-04-02 DIAGNOSIS — I4901 Ventricular fibrillation: Secondary | ICD-10-CM | POA: Diagnosis present

## 2024-04-02 DIAGNOSIS — S2249XA Multiple fractures of ribs, unspecified side, initial encounter for closed fracture: Secondary | ICD-10-CM | POA: Diagnosis not present

## 2024-04-02 DIAGNOSIS — Z66 Do not resuscitate: Secondary | ICD-10-CM | POA: Diagnosis present

## 2024-04-02 DIAGNOSIS — F329 Major depressive disorder, single episode, unspecified: Secondary | ICD-10-CM | POA: Diagnosis present

## 2024-04-02 DIAGNOSIS — K3189 Other diseases of stomach and duodenum: Secondary | ICD-10-CM | POA: Diagnosis not present

## 2024-04-02 DIAGNOSIS — Z1152 Encounter for screening for COVID-19: Secondary | ICD-10-CM | POA: Diagnosis not present

## 2024-04-02 DIAGNOSIS — I700 Atherosclerosis of aorta: Secondary | ICD-10-CM | POA: Diagnosis not present

## 2024-04-02 DIAGNOSIS — I2489 Other forms of acute ischemic heart disease: Secondary | ICD-10-CM | POA: Diagnosis present

## 2024-04-02 DIAGNOSIS — Z452 Encounter for adjustment and management of vascular access device: Secondary | ICD-10-CM | POA: Diagnosis not present

## 2024-04-02 DIAGNOSIS — K7200 Acute and subacute hepatic failure without coma: Secondary | ICD-10-CM | POA: Diagnosis present

## 2024-04-02 DIAGNOSIS — S27892A Contusion of other specified intrathoracic organs, initial encounter: Secondary | ICD-10-CM | POA: Diagnosis present

## 2024-04-02 DIAGNOSIS — K56600 Partial intestinal obstruction, unspecified as to cause: Secondary | ICD-10-CM | POA: Diagnosis present

## 2024-04-02 DIAGNOSIS — G936 Cerebral edema: Secondary | ICD-10-CM | POA: Diagnosis present

## 2024-04-02 DIAGNOSIS — G931 Anoxic brain damage, not elsewhere classified: Secondary | ICD-10-CM | POA: Diagnosis present

## 2024-04-02 DIAGNOSIS — J9602 Acute respiratory failure with hypercapnia: Secondary | ICD-10-CM | POA: Diagnosis present

## 2024-04-02 LAB — URINALYSIS, W/ REFLEX TO CULTURE (INFECTION SUSPECTED)
Bilirubin Urine: NEGATIVE
Glucose, UA: NEGATIVE mg/dL
Ketones, ur: NEGATIVE mg/dL
Leukocytes,Ua: NEGATIVE
Nitrite: NEGATIVE
Protein, ur: 100 mg/dL — AB
RBC / HPF: >50 RBC/hpf (ref 0–5)
Specific Gravity, Urine: 1.017 (ref 1.005–1.030)
pH: 5 (ref 5.0–8.0)

## 2024-04-02 LAB — RESP PANEL BY RT-PCR (RSV, FLU A&B, COVID)  RVPGX2
Influenza A by PCR: NEGATIVE
Influenza B by PCR: NEGATIVE
Resp Syncytial Virus by PCR: NEGATIVE
SARS Coronavirus 2 by RT PCR: NEGATIVE

## 2024-04-02 LAB — CBC WITH DIFFERENTIAL/PLATELET
Abs Immature Granulocytes: 0.75 K/uL — ABNORMAL HIGH (ref 0.00–0.07)
Basophils Absolute: 0 K/uL (ref 0.0–0.1)
Basophils Relative: 0 %
Eosinophils Absolute: 0 K/uL (ref 0.0–0.5)
Eosinophils Relative: 0 %
HCT: 41.2 % (ref 39.0–52.0)
Hemoglobin: 12.2 g/dL — ABNORMAL LOW (ref 13.0–17.0)
Immature Granulocytes: 5 %
Lymphocytes Relative: 29 %
Lymphs Abs: 4.2 K/uL — ABNORMAL HIGH (ref 0.7–4.0)
MCH: 32 pg (ref 26.0–34.0)
MCHC: 29.6 g/dL — ABNORMAL LOW (ref 30.0–36.0)
MCV: 108.1 fL — ABNORMAL HIGH (ref 80.0–100.0)
Monocytes Absolute: 2.1 K/uL — ABNORMAL HIGH (ref 0.1–1.0)
Monocytes Relative: 14 %
Neutro Abs: 7.7 K/uL (ref 1.7–7.7)
Neutrophils Relative %: 52 %
Platelets: 188 K/uL (ref 150–400)
RBC: 3.81 MIL/uL — ABNORMAL LOW (ref 4.22–5.81)
RDW: 14.9 % (ref 11.5–15.5)
Smear Review: NORMAL
WBC: 14.7 K/uL — ABNORMAL HIGH (ref 4.0–10.5)
nRBC: 0.5 % — ABNORMAL HIGH (ref 0.0–0.2)

## 2024-04-02 LAB — I-STAT ARTERIAL BLOOD GAS, ED
Acid-base deficit: 20 mmol/L — ABNORMAL HIGH (ref 0.0–2.0)
Bicarbonate: 12.5 mmol/L — ABNORMAL LOW (ref 20.0–28.0)
Calcium, Ion: 1.07 mmol/L — ABNORMAL LOW (ref 1.15–1.40)
HCT: 32 % — ABNORMAL LOW (ref 39.0–52.0)
Hemoglobin: 10.9 g/dL — ABNORMAL LOW (ref 13.0–17.0)
O2 Saturation: 100 %
Patient temperature: 95.6
Potassium: 4 mmol/L (ref 3.5–5.1)
Sodium: 146 mmol/L — ABNORMAL HIGH (ref 135–145)
TCO2: 14 mmol/L — ABNORMAL LOW (ref 22–32)
pCO2 arterial: 59.4 mmHg — ABNORMAL HIGH (ref 32–48)
pH, Arterial: 6.92 — CL (ref 7.35–7.45)
pO2, Arterial: 450 mmHg — ABNORMAL HIGH (ref 83–108)

## 2024-04-02 LAB — GLUCOSE, CAPILLARY
Glucose-Capillary: 287 mg/dL — ABNORMAL HIGH (ref 70–99)
Glucose-Capillary: 360 mg/dL — ABNORMAL HIGH (ref 70–99)
Glucose-Capillary: 352 mg/dL — ABNORMAL HIGH (ref 70–99)
Glucose-Capillary: 359 mg/dL — ABNORMAL HIGH (ref 70–99)
Glucose-Capillary: 415 mg/dL — ABNORMAL HIGH (ref 70–99)

## 2024-04-02 LAB — I-STAT VENOUS BLOOD GAS, ED
Acid-base deficit: 17 mmol/L — ABNORMAL HIGH (ref 0.0–2.0)
Bicarbonate: 11.8 mmol/L — ABNORMAL LOW (ref 20.0–28.0)
Calcium, Ion: 0.91 mmol/L — ABNORMAL LOW (ref 1.15–1.40)
HCT: 32 % — ABNORMAL LOW (ref 39.0–52.0)
Hemoglobin: 10.9 g/dL — ABNORMAL LOW (ref 13.0–17.0)
O2 Saturation: 91 %
Potassium: 4.1 mmol/L (ref 3.5–5.1)
Sodium: 146 mmol/L — ABNORMAL HIGH (ref 135–145)
TCO2: 13 mmol/L — ABNORMAL LOW (ref 22–32)
pCO2, Ven: 39.8 mmHg — ABNORMAL LOW (ref 44–60)
pH, Ven: 7.08 — CL (ref 7.25–7.43)
pO2, Ven: 83 mmHg — ABNORMAL HIGH (ref 32–45)

## 2024-04-02 LAB — COMPREHENSIVE METABOLIC PANEL WITH GFR
ALT: 362 U/L — ABNORMAL HIGH (ref 0–44)
AST: 486 U/L — ABNORMAL HIGH (ref 15–41)
Albumin: 1.8 g/dL — ABNORMAL LOW (ref 3.5–5.0)
Alkaline Phosphatase: 90 U/L (ref 38–126)
Anion gap: 22 — ABNORMAL HIGH (ref 5–15)
BUN: 88 mg/dL — ABNORMAL HIGH (ref 8–23)
CO2: 15 mmol/L — ABNORMAL LOW (ref 22–32)
Calcium: 8.1 mg/dL — ABNORMAL LOW (ref 8.9–10.3)
Chloride: 112 mmol/L — ABNORMAL HIGH (ref 98–111)
Creatinine, Ser: 4.25 mg/dL — ABNORMAL HIGH (ref 0.61–1.24)
GFR, Estimated: 14 mL/min — ABNORMAL LOW (ref >60)
Glucose, Bld: 200 mg/dL — ABNORMAL HIGH (ref 70–99)
Potassium: 4.3 mmol/L (ref 3.5–5.1)
Sodium: 149 mmol/L — ABNORMAL HIGH (ref 135–145)
Total Bilirubin: 0.6 mg/dL (ref 0.0–1.2)
Total Protein: 5 g/dL — ABNORMAL LOW (ref 6.5–8.1)

## 2024-04-02 LAB — BASIC METABOLIC PANEL WITH GFR
Anion gap: 29 — ABNORMAL HIGH (ref 5–15)
BUN: 85 mg/dL — ABNORMAL HIGH (ref 8–23)
CO2: 10 mmol/L — ABNORMAL LOW (ref 22–32)
Calcium: 7.4 mg/dL — ABNORMAL LOW (ref 8.9–10.3)
Chloride: 105 mmol/L (ref 98–111)
Creatinine, Ser: 4.07 mg/dL — ABNORMAL HIGH (ref 0.61–1.24)
GFR, Estimated: 14 mL/min — ABNORMAL LOW (ref >60)
Glucose, Bld: 400 mg/dL — ABNORMAL HIGH (ref 70–99)
Potassium: 3.9 mmol/L (ref 3.5–5.1)
Sodium: 144 mmol/L (ref 135–145)

## 2024-04-02 LAB — BLOOD GAS, VENOUS
Acid-base deficit: 21.9 mmol/L — ABNORMAL HIGH (ref 0.0–2.0)
Bicarbonate: 9.7 mmol/L — ABNORMAL LOW (ref 20.0–28.0)
O2 Saturation: 99.1 %
Patient temperature: 37
pCO2, Ven: 43 mmHg — ABNORMAL LOW (ref 44–60)
pH, Ven: 6.96 — CL (ref 7.25–7.43)
pO2, Ven: 116 mmHg — ABNORMAL HIGH (ref 32–45)

## 2024-04-02 LAB — POCT I-STAT 7, (LYTES, BLD GAS, ICA,H+H)
Acid-base deficit: 13 mmol/L — ABNORMAL HIGH (ref 0.0–2.0)
Bicarbonate: 16 mmol/L — ABNORMAL LOW (ref 20.0–28.0)
Calcium, Ion: 0.98 mmol/L — ABNORMAL LOW (ref 1.15–1.40)
HCT: 35 % — ABNORMAL LOW (ref 39.0–52.0)
Hemoglobin: 11.9 g/dL — ABNORMAL LOW (ref 13.0–17.0)
O2 Saturation: 91 %
Patient temperature: 36.7
Potassium: 3.2 mmol/L — ABNORMAL LOW (ref 3.5–5.1)
Sodium: 144 mmol/L (ref 135–145)
TCO2: 17 mmol/L — ABNORMAL LOW (ref 22–32)
pCO2 arterial: 45.3 mmHg (ref 32–48)
pH, Arterial: 7.153 — CL (ref 7.35–7.45)
pO2, Arterial: 77 mmHg — ABNORMAL LOW (ref 83–108)

## 2024-04-02 LAB — PROTIME-INR
INR: 1.8 — ABNORMAL HIGH (ref 0.8–1.2)
Prothrombin Time: 21.5 s — ABNORMAL HIGH (ref 11.4–15.2)

## 2024-04-02 LAB — MAGNESIUM
Magnesium: 2.6 mg/dL — ABNORMAL HIGH (ref 1.7–2.4)
Magnesium: 2.5 mg/dL — ABNORMAL HIGH (ref 1.7–2.4)

## 2024-04-02 LAB — CBC
HCT: 38.8 % — ABNORMAL LOW (ref 39.0–52.0)
HCT: 37.1 % — ABNORMAL LOW (ref 39.0–52.0)
Hemoglobin: 11.2 g/dL — ABNORMAL LOW (ref 13.0–17.0)
Hemoglobin: 11.2 g/dL — ABNORMAL LOW (ref 13.0–17.0)
MCH: 31.3 pg (ref 26.0–34.0)
MCH: 32 pg (ref 26.0–34.0)
MCHC: 28.9 g/dL — ABNORMAL LOW (ref 30.0–36.0)
MCHC: 30.2 g/dL (ref 30.0–36.0)
MCV: 108.4 fL — ABNORMAL HIGH (ref 80.0–100.0)
MCV: 106 fL — ABNORMAL HIGH (ref 80.0–100.0)
Platelets: 214 K/uL (ref 150–400)
Platelets: 230 K/uL (ref 150–400)
RBC: 3.58 MIL/uL — ABNORMAL LOW (ref 4.22–5.81)
RBC: 3.5 MIL/uL — ABNORMAL LOW (ref 4.22–5.81)
RDW: 14.8 % (ref 11.5–15.5)
RDW: 15 % (ref 11.5–15.5)
WBC: 15 K/uL — ABNORMAL HIGH (ref 4.0–10.5)
WBC: 19.4 K/uL — ABNORMAL HIGH (ref 4.0–10.5)
nRBC: 0.7 % — ABNORMAL HIGH (ref 0.0–0.2)
nRBC: 0.5 % — ABNORMAL HIGH (ref 0.0–0.2)

## 2024-04-02 LAB — LACTIC ACID, PLASMA
Lactic Acid, Venous: >9 mmol/L (ref 0.5–1.9)
Lactic Acid, Venous: >9 mmol/L (ref 0.5–1.9)

## 2024-04-02 LAB — MRSA NEXT GEN BY PCR, NASAL: MRSA by PCR Next Gen: NOT DETECTED

## 2024-04-02 LAB — HEMOGLOBIN A1C
Hgb A1c MFr Bld: 5.7 % — ABNORMAL HIGH (ref 4.8–5.6)
Mean Plasma Glucose: 116.89 mg/dL

## 2024-04-02 LAB — I-STAT CG4 LACTIC ACID, ED
Lactic Acid, Venous: 12.6 mmol/L (ref 0.5–1.9)
Lactic Acid, Venous: 13.4 mmol/L (ref 0.5–1.9)

## 2024-04-02 LAB — TROPONIN I (HIGH SENSITIVITY)
Troponin I (High Sensitivity): 361 ng/L (ref <18)
Troponin I (High Sensitivity): 626 ng/L (ref <18)
Troponin I (High Sensitivity): 954 ng/L (ref <18)

## 2024-04-02 LAB — CREATININE, SERUM
Creatinine, Ser: 3.82 mg/dL — ABNORMAL HIGH (ref 0.61–1.24)
GFR, Estimated: 16 mL/min — ABNORMAL LOW (ref >60)

## 2024-04-02 LAB — PHOSPHORUS
Phosphorus: 10.2 mg/dL — ABNORMAL HIGH (ref 2.5–4.6)
Phosphorus: 10.2 mg/dL — ABNORMAL HIGH (ref 2.5–4.6)

## 2024-04-02 MED ORDER — POLYVINYL ALCOHOL 1.4 % OP SOLN: 1.0000 [drp] | Freq: Four times a day (QID) | OPHTHALMIC | Status: DC | PRN

## 2024-04-02 MED ORDER — SODIUM CHLORIDE 0.9 % IV SOLN
2.0000 g | Freq: Once | INTRAVENOUS | Status: AC
  Administered 2024-04-02: 2 g via INTRAVENOUS
  Filled 2024-04-02: qty 10

## 2024-04-02 MED ORDER — EPINEPHRINE HCL 5 MG/250ML IV SOLN IN NS
0.5000 ug/min | INTRAVENOUS | Status: DC
  Administered 2024-04-02: 20 ug/min via INTRAVENOUS
  Administered 2024-04-02: 25 ug/min via INTRAVENOUS
  Filled 2024-04-02 (×2): qty 250

## 2024-04-02 MED ORDER — AMIODARONE LOAD VIA INFUSION
150.0000 mg | Freq: Once | INTRAVENOUS | Status: AC
  Administered 2024-04-02: 150 mg via INTRAVENOUS
  Filled 2024-04-02: qty 83.34

## 2024-04-02 MED ORDER — METRONIDAZOLE 500 MG/100ML IV SOLN
500.0000 mg | Freq: Once | INTRAVENOUS | Status: AC
  Administered 2024-04-02: 500 mg via INTRAVENOUS
  Filled 2024-04-02: qty 100

## 2024-04-02 MED ORDER — MORPHINE 100MG IN NS 100ML (1MG/ML) PREMIX INFUSION
0.0000 mg/h | INTRAVENOUS | Status: DC
  Administered 2024-04-02: 5 mg/h via INTRAVENOUS
  Filled 2024-04-02: qty 100

## 2024-04-02 MED ORDER — AMIODARONE HCL IN DEXTROSE 360-4.14 MG/200ML-% IV SOLN
INTRAVENOUS | Status: AC
  Administered 2024-04-02: 60 mg/h via INTRAVENOUS
  Filled 2024-04-02: qty 200

## 2024-04-02 MED ORDER — LACTATED RINGERS IV BOLUS (SEPSIS)
250.0000 mL | Freq: Once | INTRAVENOUS | Status: AC
  Administered 2024-04-02: 250 mL via INTRAVENOUS

## 2024-04-02 MED ORDER — POLYETHYLENE GLYCOL 3350 17 G PO PACK: 17.0000 g | PACK | Freq: Every day | ORAL | Status: DC

## 2024-04-02 MED ORDER — LACTATED RINGERS IV BOLUS (SEPSIS)
1000.0000 mL | Freq: Once | INTRAVENOUS | Status: AC
  Administered 2024-04-02: 1000 mL via INTRAVENOUS

## 2024-04-02 MED ORDER — ORAL CARE MOUTH RINSE: 15.0000 mL | OROMUCOSAL | Status: DC | PRN

## 2024-04-02 MED ORDER — GLYCOPYRROLATE 0.2 MG/ML IJ SOLN: 0.2000 mg | INTRAMUSCULAR | Status: DC | PRN

## 2024-04-02 MED ORDER — SODIUM CHLORIDE 0.9 % IV SOLN
2.0000 g | Freq: Two times a day (BID) | INTRAVENOUS | Status: DC
  Filled 2024-04-02: qty 10

## 2024-04-02 MED ORDER — SODIUM BICARBONATE 8.4 % IV SOLN
INTRAVENOUS | Status: AC
  Filled 2024-04-02: qty 100

## 2024-04-02 MED ORDER — STERILE WATER FOR INJECTION IV SOLN
INTRAVENOUS | Status: DC
  Filled 2024-04-02 (×2): qty 1000

## 2024-04-02 MED ORDER — EPINEPHRINE HCL 5 MG/250ML IV SOLN IN NS
INTRAVENOUS | Status: AC
  Filled 2024-04-02: qty 250

## 2024-04-02 MED ORDER — SODIUM BICARBONATE 8.4 % IV SOLN
Freq: Once | INTRAVENOUS | Status: AC
  Filled 2024-04-02: qty 1000

## 2024-04-02 MED ORDER — SODIUM BICARBONATE 8.4 % IV SOLN
100.0000 meq | Freq: Once | INTRAVENOUS | Status: AC
  Administered 2024-04-02: 100 meq via INTRAVENOUS
  Filled 2024-04-02: qty 50

## 2024-04-02 MED ORDER — AMIODARONE HCL IN DEXTROSE 360-4.14 MG/200ML-% IV SOLN
30.0000 mg/h | INTRAVENOUS | Status: DC
  Administered 2024-04-02 (×2): 30 mg/h via INTRAVENOUS
  Filled 2024-04-02: qty 200

## 2024-04-02 MED ORDER — VASOPRESSIN 20 UNITS/100 ML INFUSION FOR SHOCK
0.0000 [IU]/min | INTRAVENOUS | Status: DC
  Administered 2024-04-02: 0.03 [IU]/min via INTRAVENOUS
  Filled 2024-04-02 (×2): qty 100

## 2024-04-02 MED ORDER — LACTATED RINGERS IV SOLN: INTRAVENOUS | Status: AC

## 2024-04-02 MED ORDER — VANCOMYCIN HCL 2000 MG/400ML IV SOLN
2000.0000 mg | Freq: Once | INTRAVENOUS | Status: AC
  Administered 2024-04-02: 2000 mg via INTRAVENOUS
  Filled 2024-04-02: qty 400

## 2024-04-02 MED ORDER — FENTANYL CITRATE PF 50 MCG/ML IJ SOSY: 50.0000 ug | PREFILLED_SYRINGE | INTRAMUSCULAR | Status: DC | PRN

## 2024-04-02 MED ORDER — MIDAZOLAM HCL 2 MG/2ML IJ SOLN
2.0000 mg | Freq: Once | INTRAMUSCULAR | Status: AC
  Administered 2024-04-02: 2 mg via INTRAVENOUS
  Filled 2024-04-02: qty 2

## 2024-04-02 MED ORDER — GLYCOPYRROLATE 1 MG PO TABS: 1.0000 mg | ORAL_TABLET | ORAL | Status: DC | PRN

## 2024-04-02 MED ORDER — PANTOPRAZOLE SODIUM 40 MG IV SOLR
40.0000 mg | Freq: Every day | INTRAVENOUS | Status: DC
  Administered 2024-04-02: 40 mg via INTRAVENOUS
  Filled 2024-04-02: qty 10

## 2024-04-02 MED ORDER — ONDANSETRON HCL 4 MG/2ML IJ SOLN: 4.0000 mg | Freq: Four times a day (QID) | INTRAMUSCULAR | Status: DC | PRN

## 2024-04-02 MED ORDER — NOREPINEPHRINE 4 MG/250ML-% IV SOLN: 0.0000 ug/min | INTRAVENOUS | Status: DC

## 2024-04-02 MED ORDER — HEPARIN SODIUM (PORCINE) 5000 UNIT/ML IJ SOLN
5000.0000 [IU] | Freq: Three times a day (TID) | INTRAMUSCULAR | Status: DC
  Administered 2024-04-02: 5000 [IU] via SUBCUTANEOUS
  Filled 2024-04-02: qty 1

## 2024-04-02 MED ORDER — MIDAZOLAM HCL 2 MG/2ML IJ SOLN: 2.0000 mg | INTRAMUSCULAR | Status: DC | PRN

## 2024-04-02 MED ORDER — ACETAMINOPHEN 325 MG PO TABS: 650.0000 mg | ORAL_TABLET | Freq: Four times a day (QID) | ORAL | Status: DC | PRN

## 2024-04-02 MED ORDER — FENTANYL CITRATE PF 50 MCG/ML IJ SOSY
50.0000 ug | PREFILLED_SYRINGE | Freq: Once | INTRAMUSCULAR | Status: AC
  Administered 2024-04-02: 50 ug via INTRAVENOUS
  Filled 2024-04-02: qty 1

## 2024-04-02 MED ORDER — CHLORHEXIDINE GLUCONATE CLOTH 2 % EX PADS: 6.0000 | MEDICATED_PAD | Freq: Every day | CUTANEOUS | Status: DC

## 2024-04-02 MED ORDER — NOREPINEPHRINE 4 MG/250ML-% IV SOLN
0.0000 ug/min | INTRAVENOUS | Status: DC
  Administered 2024-04-02: 35 ug/min via INTRAVENOUS
  Filled 2024-04-02: qty 250

## 2024-04-02 MED ORDER — EPINEPHRINE HCL 5 MG/250ML IV SOLN IN NS
INTRAVENOUS | Status: AC
  Administered 2024-04-02: 15 ug/min
  Filled 2024-04-02: qty 250

## 2024-04-02 MED ORDER — SODIUM BICARBONATE 8.4 % IV SOLN
100.0000 meq | Freq: Once | INTRAVENOUS | Status: AC
  Administered 2024-04-02: 100 meq via INTRAVENOUS

## 2024-04-02 MED ORDER — SODIUM CHLORIDE 0.9 % IV SOLN: 250.0000 mL | INTRAVENOUS | Status: DC

## 2024-04-02 MED ORDER — VANCOMYCIN VARIABLE DOSE PER UNSTABLE RENAL FUNCTION (PHARMACIST DOSING): Status: DC

## 2024-04-02 MED ORDER — INSULIN ASPART 100 UNIT/ML IJ SOLN
0.0000 [IU] | INTRAMUSCULAR | Status: DC
  Administered 2024-04-02 (×2): 9 [IU] via SUBCUTANEOUS

## 2024-04-02 MED ORDER — SODIUM BICARBONATE 8.4 % IV SOLN
INTRAVENOUS | Status: AC
  Filled 2024-04-02: qty 50

## 2024-04-02 MED ORDER — MORPHINE BOLUS VIA INFUSION
5.0000 mg | INTRAVENOUS | Status: DC | PRN
  Administered 2024-04-02: 5 mg via INTRAVENOUS

## 2024-04-02 MED ORDER — VANCOMYCIN HCL IN DEXTROSE 1-5 GM/200ML-% IV SOLN: 1000.0000 mg | Freq: Once | INTRAVENOUS | Status: DC

## 2024-04-02 MED ORDER — NOREPINEPHRINE 16 MG/250ML-% IV SOLN
0.0000 ug/min | INTRAVENOUS | Status: DC
  Administered 2024-04-02 (×2): 37 ug/min via INTRAVENOUS
  Filled 2024-04-02 (×2): qty 250

## 2024-04-02 MED ORDER — ORAL CARE MOUTH RINSE
15.0000 mL | OROMUCOSAL | Status: DC
Start: 2024-04-02 — End: 2024-04-02
  Administered 2024-04-02: 15 mL via OROMUCOSAL

## 2024-04-02 MED ORDER — PROPOFOL 1000 MG/100ML IV EMUL: 0.0000 ug/kg/min | INTRAVENOUS | Status: DC

## 2024-04-02 MED ORDER — SODIUM CHLORIDE 0.9 % IV SOLN: INTRAVENOUS | Status: DC

## 2024-04-02 MED ORDER — DOCUSATE SODIUM 50 MG/5ML PO LIQD: 100.0000 mg | Freq: Two times a day (BID) | ORAL | Status: DC

## 2024-04-02 MED ORDER — ACETAMINOPHEN 650 MG RE SUPP: 650.0000 mg | Freq: Four times a day (QID) | RECTAL | Status: DC | PRN

## 2024-04-02 MED ORDER — METRONIDAZOLE 500 MG/100ML IV SOLN: 500.0000 mg | Freq: Two times a day (BID) | INTRAVENOUS | Status: DC

## 2024-04-02 MED ORDER — FAMOTIDINE 20 MG PO TABS: 20.0000 mg | ORAL_TABLET | Freq: Two times a day (BID) | ORAL | Status: DC

## 2024-04-02 MED ORDER — VECURONIUM BROMIDE 10 MG IV SOLR
10.0000 mg | Freq: Once | INTRAVENOUS | Status: AC
  Administered 2024-04-02: 10 mg via INTRAVENOUS
  Filled 2024-04-02: qty 10

## 2024-04-02 MED ORDER — AMIODARONE HCL IN DEXTROSE 360-4.14 MG/200ML-% IV SOLN
60.0000 mg/h | INTRAVENOUS | Status: AC
  Administered 2024-04-02: 60 mg/h via INTRAVENOUS
  Filled 2024-04-02: qty 200

## 2024-04-03 LAB — BLOOD CULTURE ID PANEL (REFLEXED) - BCID2

## 2024-04-03 NOTE — Progress Notes (Signed)
   2024/04/04 1228  Vitals  Temp (!) 102.7 F (39.3 C)  Pulse Rate (!) 127  ECG Heart Rate (!) 139  Resp (!) 0  MEWS COLOR  MEWS Score Color Comfort Care Only  Oxygen Therapy  SpO2 90 %  Art Line  Arterial Line BP 159/59  Arterial Line MAP (mmHg) 85 mmHg  MEWS Score  MEWS Temp 2  MEWS Systolic 0  MEWS Pulse 3  MEWS RR 2  MEWS LOC 3  MEWS Score 10    Patient extubated by RT per MD order.  Family at bedside.

## 2024-04-03 NOTE — Progress Notes (Signed)
 Amiodarone  150mg  bolus given.  MD at bedside and verbal order was given.

## 2024-04-03 NOTE — IPAL (Addendum)
 Interdisciplinary Goals of Care Family Meeting   Date carried out:: April 30, 2024  Location of the meeting: Bedside  Member's involved: Physician, Bedside Registered Nurse, and Family Member or next of kin  Durable Power of Attorney or acting medical decision maker: Patient's daughter, Montie Lunger  Discussion: We discussed goals of care for Dean Mcconnell .    The Clinical status was relayed to patient's daughter at bedside in detail.   Updated and notified of patients medical condition.     Patient remains unresponsive and will not open eyes to command.   Patient is having a weak cough and struggling to remove secretions.   Signs of brain damage Evidence of kidney failure   Patient with Progressive multiorgan failure with a very high probablity of a very minimal chance of meaningful recovery despite all aggressive and optimal medical therapy.  Code status: Full DNR  Disposition: In-patient comfort care    Family are satisfied with Plan of action and management. All questions answered   Valinda Novas MD [facility] See Amion for pager If no response to pager, please call [phone] until 7pm After 7pm, Please call E-link [phone]

## 2024-04-03 NOTE — Progress Notes (Addendum)
 eLink Physician-Brief Progress Note Patient Name: Dean Mcconnell DOB: [birth year] MRN: [id number]   Date of Service  05/01/2024  HPI/Events of Note  78 year old male with a history of hypertension, tobacco use, CKD who presented post out-of-hospital cardiac arrest with V. tach and acute respiratory failure with hypoxemia.  Patient is hypothermic, tachypneic, tachycardic and saturating 98% on 40% FiO2.  Currently on epinephrine , norepinephrine , amiodarone  infusions with broad-spectrum antibiotics.  Results show severe metabolic acidosis, lactic acidosis, leukocytosis.  CT head concerning for gray-white differentiation loss.  Mediastinal hematoma, anterior rib fractures, and extensive tree-in-bud opacities.  eICU Interventions  Replace lactate "Ringer's"  with sodium bicarbonate   Maintain vasopressors as needed maintain MAP greater than 65.  Norepinephrine  and vasopressin  ordered  Daily spontaneous awakening/breathing trials  DVT prophylaxis with heparin  GI prophylaxis with pantoprazole    0449 - severe acidemia, pH 6.9, additional 2ampules bicarb  Intervention Category Evaluation Type: New Patient Evaluation  [name] 2024-05-01, 3:19 AM

## 2024-04-03 NOTE — Death Summary Note (Signed)
 DEATH SUMMARY   Patient Details  Name: Dean Mcconnell MRN: [id number] DOB: 04-03-1946  Admission/Discharge Information   Admit Date:  04-16-2024  Date of Death: Date of Death: 04-17-2024  Time of Death: Time of Death: 04/26/35  Length of Stay: 1  Referring Physician: Gladis Mustard, FNP   Reason(s) for Hospitalization  Status post V-fib cardiac arrest, unclear etiology Demand cardiac ischemia postarrest Anoxic brain injury Diffuse cerebral edema postarrest V. tach/V-fib Paroxysmal A-fib with RVR Mixed metabolic and respiratory acidosis Acute respiratory failure with hypoxia and hypercapnia Severe sepsis with septic shock due to bilateral multifocal pneumonia, POA Pneumomediastinum postarrest Bilateral rib fracture secondary to CPR Left upper lobe lung mass Multifactorial encephalopathy including septic/anoxic Severe ileus AKI on CKD stage IIIb Hyperphosphatemia/hypocalcemia Shock liver Tobacco dependent Anemia of critical illness Severe ileus DNR status  Diagnoses  Preliminary cause of death: Withdrawal of care in the setting of multisystem organ failure postcardiac arrest Secondary Diagnoses (including complications and co-morbidities):  Principal Problem:   Cardiac arrest [facility])   Brief Hospital Course (including significant findings, care, treatment, and services provided and events leading to death)  Dean Mcconnell is a 78 y.o. year old male with PMH for Primary HTN, Ventricular Bigeminy, Mixed Hyperlipidemia, Hypokalemia, MDD, Tobacco abuse disorder, CKD unspecified who presented post witnessed cardiac arrest.  History form sister who is at bedside.  She says earlier yesterday he c/o difficulty breathing and sinus congestion.  He settled and they did not think much.  He was watching TV last evening and his step daughter and her husband were there and he asked them to help him get to bed.  As he got up he fell back on the sofa.  Step daughter called patient's sister who  lives 7 houses down.  She came and called 911 who instructed her to put him on the floor and begin CPR.  He was seen by EMS and shocked 1-2 times (its unclear how many shocks) and gave several Epi pushes, 1 amp D50, Narcan, Atropine, Bicarb and intubated in the field.  He down time was 15-20 min prior to ROSC.   In ED he presented intubated and distended abd and stool all over his LEs.  ED labs, PH 6.9, CO2 on chemistry 15, Cr 3.70, Trop 361, LA 12.6, WBC 14.7.  CT head negative for acute stroke but showing beginnings of cerebral edema.  His CT chest showing:   Anterior mediastinal hematoma at the level of the heart. There is a small amount of pneumomediastinum in the region of the hematoma. 2. Bilateral anterior rib fractures. No pneumothorax. 3. Extensive Tree-in-bud opacities throughout both lungs with patchy/nodular densities in the lower lobes. Findings are favored as infectious/inflammatory. 4. Cystic lesion in the left upper lobe with peripheral spiculated nodule. Findings are worrisome for neoplasm. 5. Mildly dilated loops of mid and proximal small bowel without definitive transition point. Findings may represent ileus or partial small bowel obstruction. 6. Marked distention of the stomach with air-fluid level. The tip of the enteric tube is in the body of the stomach. 7. Aortic atherosclerosis.   Patient was admitted to ICU, he was requiring high-dose vasopressor support with Levophed , epinephrine  and vasopressin , his pH remained at 7.1 despite bicarbonate boluses and bicarbonate infusion.  He was continued on broad-spectrum antibiotics, despite full scope of care patient continued to decline and went into multisystem organ failure, goals of care discussions were carried patient was made DNR by family.  Once all family member visited, patient's family requested to proceed  with comfort care and palliative extubation.  Patient was palliatively extubated and he passed on 04-18-2024 at 12:36  PM.  Patient's family was at bedside   Pertinent Labs and Studies  Significant Diagnostic Studies DG CHEST PORT 1 VIEW Result Date: April 18, 2024 CLINICAL DATA:  78 year old male central line placement. Status post CPR. EXAM: PORTABLE CHEST 1 VIEW COMPARISON:  CT Chest, Abdomen, and Pelvis 0027 hours today, and earlier. FINDINGS: Portable AP supine view at 0353 hours. Endotracheal tube remains in place, tip at the clavicles. Enteric tube is difficult to identify now, but appears to remain coursing through the central mediastinum to the abdomen. New left IJ approach central line, tip at the lower SVC level below the carina. Stable cardiac and mediastinal contours from 2347 hours last night. No pneumothorax identified. Stable lung volumes and ventilation. Coarse bilateral pulmonary interstitial opacity with no overt edema. No pleural effusion or consolidation identified. Rib fractures better demonstrated by CT. Paucity of bowel gas in the visible abdomen. IMPRESSION: 1. New left IJ approach central line, tip at the lower SVC level. No pneumothorax. 2. Stable ETT tip at the level of the clavicles. Enteric tube difficult to identify on this exam. 3. Stable lung volumes and ventilation, coarse interstitial opacity characterized by CT 0027 hours today. Electronically Signed   By: VEAR Hurst M.D.   On: 04/18/2024 04:12   CT CHEST ABDOMEN PELVIS WO CONTRAST Result Date: 18-Apr-2024 CLINICAL DATA:  Trauma. EXAM: CT CHEST, ABDOMEN AND PELVIS WITHOUT CONTRAST TECHNIQUE: Multidetector CT imaging of the chest, abdomen and pelvis was performed following the standard protocol without IV contrast. RADIATION DOSE REDUCTION: This exam was performed according to the departmental dose-optimization program which includes automated exposure control, adjustment of the mA and/or kV according to patient size and/or use of iterative reconstruction technique. COMPARISON:  None Available. FINDINGS: CT CHEST FINDINGS Cardiovascular: Heart and  aorta are normal in size. There is no pericardial effusion. There are atherosclerotic calcifications of the aorta. Mediastinum/Nodes: Endotracheal tube tip is 5.8 cm above the carina. Enteric tube is seen throughout a nondilated esophagus. There some prominent lower paraesophageal lymph nodes measuring up to 8 mm. Enlarged low right paratracheal lymph node measures 10 mm. Visualized esophagus is within normal limits. There is anterior mediastinal hematoma at the level of the heart measuring up to 2.8 cm in thickness. There is a small amount of pneumomediastinum image 3/38 in the region of the hematoma. Lungs/Pleura: Tree-in-bud opacities are seen diffusely throughout both lungs in the lower lung predominance. Patchy/nodular densities are seen in the right lower lobe measuring up to 10 mm. Additional ill-defined nodular densities are seen in the left lower lobe measuring up to 10 mm. Cystic lesion is seen in the left upper lobe with peripheral spiculated nodule. The nodular density measures 11 x 17 mm in there is mild surrounding ground-glass opacity. There is no pleural effusion or pneumothorax. Musculoskeletal: There are anterior right second through sixth rib fractures. There are anterior left second through fifth rib fractures. No definitive sternal fractures are seen. Degenerative changes affect the spine. CT ABDOMEN PELVIS FINDINGS Hepatobiliary: No hepatic injury or perihepatic hematoma. Gallbladder is unremarkable Pancreas: Unremarkable. No pancreatic ductal dilatation or surrounding inflammatory changes. Spleen: No splenic injury or perisplenic hematoma. Adrenals/Urinary Tract: There is nonspecific bilateral adrenal thickening. The bladder is decompressed by Foley catheter. There is bilateral renal atrophy. The kidneys are otherwise within normal limits. Stomach/Bowel: There are mildly dilated loops of mid and proximal small bowel measuring up to 3.6 cm.  The stomach is markedly dilated with air-fluid level.  The tip of the enteric tube is in the body of the stomach. No definitive transition point visualized. The appendix is not seen. Colon is within normal limits. No focal wall thickening or inflammation. No pneumatosis or free air. Vascular/Lymphatic: Aortic atherosclerosis. No enlarged abdominal or pelvic lymph nodes. Reproductive: Prostate is unremarkable. Other: No abdominal wall hernia or abnormality. No abdominopelvic ascites. Musculoskeletal: Degenerative changes affect the spine. No acute fractures are identified. IMPRESSION: 1. Anterior mediastinal hematoma at the level of the heart. There is a small amount of pneumomediastinum in the region of the hematoma. 2. Bilateral anterior rib fractures. No pneumothorax. 3. Extensive Tree-in-bud opacities throughout both lungs with patchy/nodular densities in the lower lobes. Findings are favored as infectious/inflammatory. 4. Cystic lesion in the left upper lobe with peripheral spiculated nodule. Findings are worrisome for neoplasm. 5. Mildly dilated loops of mid and proximal small bowel without definitive transition point. Findings may represent ileus or partial small bowel obstruction. 6. Marked distention of the stomach with air-fluid level. The tip of the enteric tube is in the body of the stomach. 7. Aortic atherosclerosis. Aortic Atherosclerosis (ICD10-I70.0). Electronically Signed   By: Greig Pique M.D.   On: 04-09-2024 01:00   CT Head Wo Contrast Result Date: 04-09-24 CLINICAL DATA:  Initial evaluation for acute syncope/presyncope, stroke suspected, CPR. EXAM: CT HEAD WITHOUT CONTRAST TECHNIQUE: Contiguous axial images were obtained from the base of the skull through the vertex without intravenous contrast. RADIATION DOSE REDUCTION: This exam was performed according to the departmental dose-optimization program which includes automated exposure control, adjustment of the mA and/or kV according to patient size and/or use of iterative reconstruction  technique. COMPARISON:  None Available. FINDINGS: Brain: Cerebral volume within normal limits for age. There is question of subtle/early loss of gray-white matter differentiation about both cerebral hemispheres. Given history of CPR, finding could reflect early changes of anoxic injury. No acute intracranial hemorrhage. No acute cortically based infarct. No mass lesion, midline shift or mass effect. No hydrocephalus or extra-axial fluid collection. Scattered patchy hypodensity involving the supratentorial cerebral white matter, likely changes of chronic microvascular ischemic disease. Vascular: No abnormal hyperdense vessel. Calcified atherosclerosis present at skull base. Skull: Scalp soft tissues demonstrate no acute finding. Calvarium intact. Sinuses/Orbits: Globes orbital soft tissues within normal limits. Scattered mucosal thickening noted about the sphenoid ethmoidal sinuses. No significant mastoid effusion. Other: Patient is intubated. IMPRESSION: 1. Question subtle/early loss of gray-white matter differentiation with loss of cortical sulcation. Given history of CPR, findings could reflect early changes of anoxic injury and developing cerebral edema. Correlation with dedicated MRI could be performed for further evaluation as warranted. 2. No other acute intracranial abnormality. 3. Probable underlying mild chronic microvascular ischemic disease. Electronically Signed   By: Morene Hoard M.D.   On: 2024/04/09 00:50   DG Chest Portable 1 View Result Date: 04/09/24 CLINICAL DATA:  CPR, cardiac arrest. EXAM: PORTABLE CHEST 1 VIEW COMPARISON:  Chest x-ray 07/20/2022 FINDINGS: Endotracheal tube tip is 6 cm above the carina. Enteric tube extends below the diaphragm. The cardiomediastinal silhouette is within normal limits. There are mild patchy airspace opacities throughout both mid and lower lungs. There is blunting of the right costophrenic angle. There is no pneumothorax. No acute fractures are seen.  IMPRESSION: 1. Endotracheal tube tip is 6 cm above the carina. 2. Mild patchy airspace opacities throughout both mid and lower lungs, which may represent edema or infection. Electronically Signed   By: Amy  Maple M.D.   On: Apr 28, 2024 00:04    Microbiology Recent Results (from the past 240 hours)  Blood culture (routine x 2)     Status: None (Preliminary result)   Collection Time: 04/01/24 11:54 PM   Specimen: BLOOD LEFT ARM  Result Value Ref Range Status   Specimen Description BLOOD LEFT ARM  Final   Special Requests   Final    BOTTLES DRAWN AEROBIC AND ANAEROBIC Blood Culture adequate volume   Culture   Final    NO GROWTH < 12 HOURS Performed at [facility], [address], [place], [place] [address]    Report Status PENDING  Incomplete  Resp panel by RT-PCR (RSV, Flu A&B, Covid) Anterior Nasal Swab     Status: None   Collection Time: Apr 28, 2024 12:08 AM   Specimen: Anterior Nasal Swab  Result Value Ref Range Status   SARS Coronavirus 2 by RT PCR NEGATIVE NEGATIVE Final   Influenza A by PCR NEGATIVE NEGATIVE Final   Influenza B by PCR NEGATIVE NEGATIVE Final    Comment: (NOTE) The Xpert Xpress SARS-CoV-2/FLU/RSV plus assay is intended as an aid in the diagnosis of influenza from Nasopharyngeal swab specimens and should not be used as a sole basis for treatment. Nasal washings and aspirates are unacceptable for Xpert Xpress SARS-CoV-2/FLU/RSV testing.  Fact Sheet for Patients: BloggerCourse.com  Fact Sheet for Healthcare Providers: SeriousBroker.it  This test is not yet approved or cleared by the [place]  FDA and has been authorized for detection and/or diagnosis of SARS-CoV-2 by FDA under an Emergency Use Authorization (EUA). This EUA will remain in effect (meaning this test can be used) for the duration of the COVID-19 declaration under Section 564(b)(1) of the Act, 21 U.S.C. section 360bbb-3(b)(1), unless the  authorization is terminated or revoked.     Resp Syncytial Virus by PCR NEGATIVE NEGATIVE Final    Comment: (NOTE) Fact Sheet for Patients: BloggerCourse.com  Fact Sheet for Healthcare Providers: SeriousBroker.it  This test is not yet approved or cleared by the [place]  FDA and has been authorized for detection and/or diagnosis of SARS-CoV-2 by FDA under an Emergency Use Authorization (EUA). This EUA will remain in effect (meaning this test can be used) for the duration of the COVID-19 declaration under Section 564(b)(1) of the Act, 21 U.S.C. section 360bbb-3(b)(1), unless the authorization is terminated or revoked.  Performed at [facility], [address], [place], [place] [address]   Blood culture (routine x 2)     Status: None (Preliminary result)   Collection Time: 04/28/24 12:13 AM   Specimen: BLOOD RIGHT HAND  Result Value Ref Range Status   Specimen Description BLOOD RIGHT HAND  Final   Special Requests   Final    BOTTLES DRAWN AEROBIC ONLY Blood Culture adequate volume   Culture   Final    NO GROWTH < 12 HOURS Performed at [facility], [address], [place], [place] [address]    Report Status PENDING  Incomplete  Culture, Respiratory w Gram Stain (tracheal aspirate)     Status: None (Preliminary result)   Collection Time: 28-Apr-2024  1:44 AM   Specimen: Tracheal Aspirate; Respiratory  Result Value Ref Range Status   Specimen Description TRACHEAL ASPIRATE  Final   Special Requests NONE  Final   Gram Stain   Final    ABUNDANT WBC PRESENT, PREDOMINANTLY PMN FEW GRAM POSITIVE COCCI Performed at [facility], [address], [place], [place] [address]  Culture PENDING  Incomplete   Report Status PENDING  Incomplete  MRSA Next Gen by PCR, Nasal     Status: None   Collection Time: 04-23-24  2:59 AM   Specimen: Nasal Mucosa; Nasal Swab  Result Value Ref Range Status   MRSA by PCR Next Gen  NOT DETECTED NOT DETECTED Final    Comment: (NOTE) The GeneXpert MRSA Assay (FDA approved for NASAL specimens only), is one component of a comprehensive MRSA colonization surveillance program. It is not intended to diagnose MRSA infection nor to guide or monitor treatment for MRSA infections. Test performance is not FDA approved in patients less than 89 years old. Performed at [facility], [address], [place], [place] [address]     Lab Basic Metabolic Panel: Recent Labs  Lab 04/01/24 [phone] 04/01/24 2356 04/23/2024 0006 23-Apr-2024 0142 04-23-24 0155 04-23-2024 0349 April 23, 2024 0722  NA 149* 147* 146*  --  146* 144 144  K 4.3 4.3 4.0  --  4.1 3.9 3.2*  CL 112* 113*  --   --   --  105  --   CO2 15*  --   --   --   --  10*  --   GLUCOSE 200* 191*  --   --   --  400*  --   BUN 88* 96*  --   --   --  85*  --   CREATININE 4.25* 3.70*  --  3.82*  --  4.07*  --   CALCIUM  8.1*  --   --   --   --  7.4*  --   MG  --   --   --  2.6*  --  2.5*  --   PHOS  --   --   --  10.2*  --  10.2*  --    Liver Function Tests: Recent Labs  Lab 04/01/24 2339  AST 486*  ALT 362*  ALKPHOS 90  BILITOT 0.6  PROT 5.0*  ALBUMIN 1.8*   No results for input(s): LIPASE, AMYLASE in the last 168 hours. No results for input(s): AMMONIA in the last 168 hours. CBC: Recent Labs  Lab 04/01/24 2339 04/01/24 2356 23-Apr-2024 0006 2024/04/23 0142 04-23-2024 0155 Apr 23, 2024 0349 2024-04-23 0722  WBC 14.7*  --   --  15.0*  --  19.4*  --   NEUTROABS 7.7  --   --   --   --   --   --   HGB 12.2*   < > 10.9* 11.2* 10.9* 11.2* 11.9*  HCT 41.2   < > 32.0* 38.8* 32.0* 37.1* 35.0*  MCV 108.1*  --   --  108.4*  --  106.0*  --   PLT 188  --   --  214  --  230  --    < > = values in this interval not displayed.   Cardiac Enzymes: No results for input(s): CKTOTAL, CKMB, CKMBINDEX, TROPONINI in the last 168 hours. Sepsis Labs: Recent Labs  Lab 04/01/24 2339 23-Apr-2024 0016 Apr 23, 2024 0142 2024-04-23 0143  2024-04-23 0146 04-23-2024 0349  WBC 14.7*  --  15.0*  --   --  19.4*  LATICACIDVEN >9.0* 12.6*  --  >9.0* 13.4*  --     Procedures/Operations     Narcissa Melder April 23, 2024, 3:49 PM

## 2024-04-03 NOTE — Procedures (Signed)
 RT Art Line Insertion  Date/Time: Apr 24, 2024 1:06 AM  Performed by: Delores Elsie DEL, RRT Authorized by: Nettie Earing, MD  Consent: The procedure was performed in an emergent situation. Verbal consent obtained. Written consent not obtained Site marked: the operative site was marked Patient identity confirmed: arm band, hospital-assigned identification number and anonymous protocol, patient vented/unresponsive Time out: Immediately prior to procedure a time out was called to verify the correct patient, procedure, equipment, support staff and site/side marked as required. Preparation: Patient was prepped and draped in the usual sterile fashion. Local anesthesia used: no  Anesthesia: Local anesthesia used: no  Sedation: Patient sedated: no  Patient tolerance: patient tolerated the procedure well with no immediate complications

## 2024-04-03 NOTE — Procedures (Signed)
 Central Venous Catheter Insertion Procedure Note  Dean Mcconnell  [id number]  02/04/1946  Date:04/27/24  Time:3:31 AM   Provider Performing:Lennex Pietila R Eulonda Andalon   Procedure: Insertion of Non-tunneled Central Venous Catheter(36556) with US  guidance (23062)   Indication(s) Medication administration  Consent Unable to obtain consent due to emergent nature of procedure.  Anesthesia Topical only with 1% lidocaine   Timeout Verified patient identification, verified procedure, site/side was marked, verified correct patient position, special equipment/implants available, medications/allergies/relevant history reviewed, required imaging and test results available.  Sterile Technique Maximal sterile technique including full sterile barrier drape, hand hygiene, sterile gown, sterile gloves, mask, hair covering, sterile ultrasound probe cover (if used).  Procedure Description Area of catheter insertion was cleaned with chlorhexidine  and draped in sterile fashion.  With real-time ultrasound guidance a central venous catheter was placed into the left internal jugular vein. Nonpulsatile blood flow and easy flushing noted in all ports.  The catheter was sutured in place and sterile dressing applied.  Complications/Tolerance None; patient tolerated the procedure well. Chest X-ray is ordered to verify placement for internal jugular or subclavian cannulation.   Chest x-ray is not ordered for femoral cannulation.  EBL Minimal  Specimen(s) None   Dean Mcconnell Dean Arreola, PA-C

## 2024-04-03 NOTE — Plan of Care (Signed)
  Problem: Pain Managment: Goal: General experience of comfort will improve and/or be controlled Outcome: Progressing   Problem: Safety: Goal: Ability to remain free from injury will improve Outcome: Progressing

## 2024-04-03 NOTE — Progress Notes (Addendum)
 78 year old male who was admitted status post V-fib cardiac arrest overnight  This morning patient was noted to have increasing vasopressor requirement, currently on Levophed  at 40 mics, epinephrine  23 mics and vasopressin  at 0.04 units He is severely acidotic with pH 7.1 despite being on bicarbonate infusion, not making any urine remain anuric since admission    Physical exam: General: Crtitically ill-appearing male, orally intubated HEENT: [place]/AT, eyes anicteric.  ETT and cortrak in place Neuro: Eyes closed, does not open, not following commands, pupils pinpoint, nonreactive to light Chest: Bilateral crackles all over Heart: Regularly irregular, tachycardic Abdomen: Soft, distended, bowel sounds absent  Labs and images reviewed  Assessment plan: Status post V-fib cardiac arrest, unclear etiology Demand cardiac ischemia postarrest Anoxic brain injury Diffuse cerebral edema postarrest V. tach/V-fib Paroxysmal A-fib with RVR Mixed metabolic and respiratory acidosis Acute respiratory failure with hypoxia and hypercapnia Severe sepsis with septic shock due to bilateral multifocal pneumonia, POA Pneumomediastinum postarrest Bilateral rib fracture secondary to CPR Left upper lobe lung mass Multifactorial encephalopathy including septic/anoxic Severe ileus AKI on CKD stage IIIb Hyperphosphatemia/hypocalcemia Shock liver Tobacco dependent Anemia of critical illness Severe ileus  Patient is in multisystem organ failure, goals of care discussions were carried with patient's daughter at bedside He is on amiodarone  and multiple vasopressors including Levophed , vasopressin  and epinephrine  He is on bicarbonate infusion Vent setting adjusted  to clear hypercapnia Initial head CT showing signs of diffuse cerebral edema and anoxic brain injury Remained in A-fib with RVR on amiodarone  infusion  Considering patient is in multisystem organ failure and increasing vasopressor requirement,  patient's daughter decided to proceed with comfort care and palliative extubation once patient's family arrives at bedside   My critical care time 40 minutes  Valinda Novas, MD

## 2024-04-03 NOTE — Sepsis Progress Note (Signed)
 Elink following for sepsis protocol.

## 2024-04-03 NOTE — H&P (Signed)
 NAME:  Dean Mcconnell, MRN:  [id number], DOB:  1945/09/06, LOS: 0 ADMISSION DATE:  04/01/2024, CONSULTATION DATE:  28-Apr-2024 REFERRING MD:  April Palumbo, MD, CHIEF COMPLAINT:  Cardiac Arrest  History of Present Illness:  77 y/o male with PMH for Primary HTN, Ventricular Bigeminy, Mixed Hyperlipidemia, Hypokalemia, MDD, Tobacco abuse disorder, CKD unspecified who presented post witnessed cardiac arrest.  History form sister who is at bedside.  She says earlier yesterday he c/o difficulty breathing and sinus congestion.  He settled and they did not think much.  He was watching TV last evening and his step daughter and her husband were there and he asked them to help him get to bed.  As he got up he fell back on the sofa.  Step daughter called patient's sister who lives 7 houses down.  She came and called 911 who instructed her to put him on the floor and begin CPR.  He was seen by EMS and shocked 1-2 times (its unclear how many shocks) and gave several Epi pushes, 1 amp D50, Narcan, Atropine, Bicarb and intubated in the field.  He down time was 15-20 min prior to ROSC.   In ED he presented intubated and distended abd and stool all over his LEs.  ED labs, PH 6.9, CO2 on chemistry 15, Cr 3.70, Trop 361, LA 12.6, WBC 14.7.  CT head negative for acute stroke but showing beginnings of cerebral edema.  His CT chest showing:   Anterior mediastinal hematoma at the level of the heart. There is a small amount of pneumomediastinum in the region of the hematoma. 2. Bilateral anterior rib fractures. No pneumothorax. 3. Extensive Tree-in-bud opacities throughout both lungs with patchy/nodular densities in the lower lobes. Findings are favored as infectious/inflammatory. 4. Cystic lesion in the left upper lobe with peripheral spiculated nodule. Findings are worrisome for neoplasm. 5. Mildly dilated loops of mid and proximal small bowel without definitive transition point. Findings may represent ileus or  partial small bowel obstruction. 6. Marked distention of the stomach with air-fluid level. The tip of the enteric tube is in the body of the stomach. 7. Aortic atherosclerosis.  Pertinent  Medical History  Primary HTN, Ventricular Bigeminy, Mixed Hyperlipidemia, Hypokalemia, MDD, Tobacco abuse disorder, CKD unspecified  Significant Hospital Events: Including procedures, antibiotic start and stop dates in addition to other pertinent events   8/30: Admit to ICU  Interim History / Subjective:  N/a  Objective    Blood pressure 128/60, pulse (!) 104, temperature (!) 93.8 F (34.3 C), resp. rate (!) 24, SpO2 100%.    Vent Mode: PRVC FiO2 (%):  [40 %-100 %] 40 % Set Rate:  [20 bmp-24 bmp] 24 bmp Vt Set:  [540 mL-620 mL] 620 mL PEEP:  [5 cmH20] 5 cmH20 Plateau Pressure:  [25 cmH20] 25 cmH20  No intake or output data in the 24 hours ending 04-28-24 0112 There were no vitals filed for this visit.  Examination: General: NAD sedated on mech vent HENT: injected sclerae, dry mm, ETT in mouth Lungs: Cresp b/l diminished bs b/l, no wheezes Cardiovascular: reg s1s2 no murmurs gallops or rubs Abdomen: distended, minimal BS, nt, no guarding Extremities: no cyanosis, clubbing or edema, cool to touch Neuro: Unresponsive, sedated on mech vent GU: Foley  Resolved problem list   Assessment and Plan  Cardiac arrest Cardiac Monitoring Increased troponins from demand ischemia, follow levels Normothermic protocol Supportive care Possible need for 3% saline if cerebral edema worsens secondary to anoxic brain injury V tach Check  Mg2+ Cardiac monitoring Started on Amiodarone  drip in ED Acute respiratory failure Vent management SAT/SBT when appropriate Monitor O2 sats, serial ABGs VAP preventive measures Lactic Acidosis Secondary to cardiac arrest IV fluids Maintain MAPs 65 >/= Pneumonia-atypical Broad spectrum antibiotics Tracheal aspirates for cultures Sepsis Sepsis bolus Broad  spectrum antibiotics UA with cultures Hypotension Currently hypotension on Levophed  Ileus NGT to LWS Monitor electrolytes and IV hydration Pneumomediastinum Monitor for now Likely from CPR Rib fractures Likely from CPR Supportive care AKI on CKD Monitor serial Cr Avoid nephrotoxic agents Monitor Is/Os  LUL spiculated mass F/o Pulmonary outpatient Smoking cessation counseling when extubated   Labs   CBC: Recent Labs  Lab 04/01/24 2339 04/01/24 2356 04/09/24 0006  WBC 14.7*  --   --   NEUTROABS 7.7  --   --   HGB 12.2* 11.6* 10.9*  HCT 41.2 34.0* 32.0*  MCV 108.1*  --   --   PLT 188  --   --     Basic Metabolic Panel: Recent Labs  Lab 04/01/24 2339 04/01/24 2356 04-09-24 0006  NA 149* 147* 146*  K 4.3 4.3 4.0  CL 112* 113*  --   CO2 15*  --   --   GLUCOSE 200* 191*  --   BUN 88* 96*  --   CREATININE 4.25* 3.70*  --   CALCIUM  8.1*  --   --    GFR: CrCl cannot be calculated (Unknown ideal weight.). Recent Labs  Lab 04/01/24 2339 Apr 09, 2024 0016  WBC 14.7*  --   LATICACIDVEN >9.0* 12.6*    Liver Function Tests: Recent Labs  Lab 04/01/24 2339  AST 486*  ALT 362*  ALKPHOS 90  BILITOT 0.6  PROT 5.0*  ALBUMIN 1.8*   No results for input(s): LIPASE, AMYLASE in the last 168 hours. No results for input(s): AMMONIA in the last 168 hours.  ABG    Component Value Date/Time   PHART 6.920 (LL) 04-09-24 0006   PCO2ART 59.4 (H) 04/09/2024 0006   PO2ART 450 (H) 09-Apr-2024 0006   HCO3 12.5 (L) 04/09/24 0006   TCO2 14 (L) 2024/04/09 0006   ACIDBASEDEF 20.0 (H) Apr 09, 2024 0006   O2SAT 100 04-09-2024 0006     Coagulation Profile: Recent Labs  Lab 04/01/24 2339  INR 1.8*    Cardiac Enzymes: No results for input(s): CKTOTAL, CKMB, CKMBINDEX, TROPONINI in the last 168 hours.  HbA1C: HB A1C (BAYER DCA - WAIVED)  Date/Time Value Ref Range Status  11/18/2017 10:11 AM 5.5 <7.0 % Final    Comment:                                           Diabetic Adult            <7.0                                       Healthy Adult        4.3 - 5.7                                                           (  DCCT/NGSP) American Diabetes Association's Summary of Glycemic Recommendations for Adults with Diabetes: Hemoglobin A1c <7.0%. More stringent glycemic goals (A1c <6.0%) may further reduce complications at the cost of increased risk of hypoglycemia.     CBG: No results for input(s): GLUCAP in the last 168 hours.  Review of Systems:   Unable to obtain, intubated and post arrest  Past Medical History:  He,  has a past medical history of Cancer (HCC), Hyperlipidemia, and Hypertension.   Surgical History:   Past Surgical History:  Procedure Laterality Date   SKIN CANCER EXCISION     nose     Social History:   reports that he has been smoking cigarettes. He has a 7.5 pack-year smoking history. He quit smokeless tobacco use about 14 years ago.  His smokeless tobacco use included chew. He reports that he does not drink alcohol  and does not use drugs.   Family History:  His family history includes Hypertension in his father.   Allergies Allergies  Allergen Reactions   Penicillins Swelling     Home Medications  Prior to Admission medications   Medication Sig Start Date End Date Taking? Authorizing Provider  atorvastatin  (LIPITOR) 40 MG tablet Take 1 tablet (40 mg total) by mouth daily. 01/18/24   Gladis Mary-Margaret, FNP  cetirizine  (ZYRTEC ) 10 MG tablet Take 1 tablet (10 mg total) by mouth daily. 05/03/20   Lavell Bari LABOR, FNP  fluticasone  (FLONASE ) 50 MCG/ACT nasal spray Place 2 sprays into both nostrils daily. 03/01/19   Lavell Bari LABOR, FNP  hydrochlorothiazide  (HYDRODIURIL ) 25 MG tablet Take 1 tablet (25 mg total) by mouth daily. 01/18/24   Gladis Mary-Margaret, FNP  losartan  (COZAAR ) 100 MG tablet Take 1 tablet (100 mg total) by mouth daily. 01/18/24   Gladis Mary-Margaret, FNP  potassium chloride  SA  (KLOR-CON  M) 20 MEQ tablet Take 1 tablet (20 mEq total) by mouth daily. 01/18/24   Gladis Mustard, FNP     Critical care time: 39   The patient is critically ill with multiple organ system failure and requires high complexity decision making for assessment and support, frequent evaluation and titration of therapies, advanced monitoring, review of radiographic studies and interpretation of complex data.   Critical Care Time devoted to patient care services, exclusive of separately billable procedures, described in this note is 38 minutes.   Orlin Fairly, MD [facility] See Amion for pager  If no response to pager , please call [phone] until 7pm After 7:00 pm call Elink  [phone] 05/02/24, 1:12 AM

## 2024-04-03 NOTE — Progress Notes (Signed)
 Pharmacy Antibiotic Note  Dean Mcconnell is a 78 y.o. male admitted on 04/01/2024 s/p witnessed cardiac arrest.  Pharmacy has been consulted for vancomycin  dosing.  -WBC 14, sCr 3.7, lactate 13 -COVID/flu negative, blood cultures collected, MRSA PCR/trach aspirate ordered -CT: opacities throughout both lungs with patchy/nodular densities in the lower lobes  Plan: -Aztreonam  2g IV every 12 hours -Flagyl  500mg  IV every 12 hours -Vancomycin  2g IV x1 -Vancomycin  variable until AKI resolves-order levels when appropriate  -Monitor renal function for dose adjustments  -Follow up signs of clinical improvement, LOT, de-escalation of antibiotics   Weight: 88.5 kg (195 lb 1.7 oz) (Wt from 01/2024)  Temp (24hrs), Avg:93.9 F (34.4 C), Min:93 F (33.9 C), Max:95.6 F (35.3 C)  Recent Labs  Lab 04/01/24 2339 04/01/24 2356 04/29/2024 0016 04/29/2024 0146  WBC 14.7*  --   --   --   CREATININE 4.25* 3.70*  --   --   LATICACIDVEN >9.0*  --  12.6* 13.4*    Estimated Creatinine Clearance: 17.8 mL/min (A) (by C-G formula based on SCr of 3.7 mg/dL (H)).    Allergies  Allergen Reactions   Penicillins Swelling    Antimicrobials this admission: Aztreonam  04-29-2024 >>  Flagyl  04/29/2024 >> Vancomycin  April 29, 2024 >>   Microbiology results: 04/29/2024 BCx: collected 04-29-2024 MRSA PCR:   Thank you for allowing pharmacy to be a part of this patient's care.  Lynwood Poplar, PharmD, BCPS Clinical Pharmacist 2024/04/29 1:59 AM

## 2024-04-03 NOTE — Procedures (Addendum)
 Extubation Procedure Note  Patient Details:   Name: Dean Mcconnell DOB: 1945-12-20 MRN: [id number]   Airway Documentation:    Vent end date: 04-29-2024 Vent end time: 1225   Pt extubated to comfort care per MD order with RN and family at bedside.  Margarie CHRISTELLA Breen 04-29-24, 1:13 PM

## 2024-04-03 NOTE — ED Notes (Signed)
 Patient transported to CT

## 2024-04-03 NOTE — ED Provider Notes (Signed)
 EDP present but did not speak with EMS, listened silently Witnessed by Clotilda and nurses and penni Land, Rasa Degrazia, MD 30-Apr-2024 0200

## 2024-04-03 NOTE — ED Provider Notes (Signed)
 [place] EMERGENCY DEPARTMENT AT [facility] Provider Note   CSN: [id number] Arrival date & time: 04/01/24  2333     Patient presents with: Cardiac Arrest   Dean Mcconnell is a 78 y.o. male.   The history is provided by the EMS personnel. The history is limited by the condition of the patient (intubated).  Cardiac Arrest Witnessed by:  Family member Incident location:  Home Time before BLS initiated: unknown. Time before ALS initiated: unknown. Condition upon EMS arrival:  Unresponsive Pulse:  Absent Treatments prior to arrival:  ACLS protocol, AED discharged and intubation Medications given prior to ED:  Sodium bicarbonate , epinephrine  and atropine Rhythm after defibrillation:  Sinus tachycardia Airway:  Intubation prior to arrival Rhythm on admission to ED:  Sinus tachycardia Associated symptoms: syncope   Risk factors: no diabetes mellitus   Risk factors comment:  HTN, renal insufficiency and hyperlidiemia      Prior to Admission medications   Medication Sig Start Date End Date Taking? Authorizing Provider  atorvastatin  (LIPITOR) 40 MG tablet Take 1 tablet (40 mg total) by mouth daily. 01/18/24   Gladis Mary-Margaret, FNP  cetirizine  (ZYRTEC ) 10 MG tablet Take 1 tablet (10 mg total) by mouth daily. 05/03/20   Lavell Lye A, FNP  fluticasone  (FLONASE ) 50 MCG/ACT nasal spray Place 2 sprays into both nostrils daily. 03/01/19   Lavell Lye A, FNP  hydrochlorothiazide  (HYDRODIURIL ) 25 MG tablet Take 1 tablet (25 mg total) by mouth daily. 01/18/24   Gladis, Mary-Margaret, FNP  losartan  (COZAAR ) 100 MG tablet Take 1 tablet (100 mg total) by mouth daily. 01/18/24   Gladis Mary-Margaret, FNP  potassium chloride  SA (KLOR-CON  M) 20 MEQ tablet Take 1 tablet (20 mEq total) by mouth daily. 01/18/24   Gladis Mustard, FNP    Allergies: Penicillins    Review of Systems  Unable to perform ROS: Acuity of condition  Cardiovascular:  Positive for syncope.     Updated Vital Signs BP (!) 107/47   Pulse 98   Temp (!) 93.5 F (34.2 C)   Resp (!) 24   Wt 88.5 kg Comment: Wt from 01/2024  SpO2 97%   BMI 27.21 kg/m   Physical Exam Vitals and nursing note reviewed. Exam conducted with a chaperone present.  Constitutional:      General: He is not in acute distress.    Appearance: He is well-developed. He is ill-appearing. He is not diaphoretic.     Comments: Intubated   HENT:     Head: Normocephalic and atraumatic.     Nose: Nose normal.  Eyes:     Conjunctiva/sclera: Conjunctivae normal.  Cardiovascular:     Rate and Rhythm: Regular rhythm. Tachycardia present.     Pulses: Normal pulses.  Pulmonary:     Effort: Pulmonary effort is normal.     Breath sounds: Normal breath sounds. No wheezing or rales.  Abdominal:     General: There is distension.     Tenderness: There is abdominal tenderness. There is rebound.  Musculoskeletal:        General: Normal range of motion.     Cervical back: Normal range of motion and neck supple.  Skin:    General: Skin is warm and dry.     Capillary Refill: Capillary refill takes less than 2 seconds.  Neurological:     GCS: GCS eye subscore is 1. GCS verbal subscore is 1. GCS motor subscore is 1.     (all labs ordered are listed, but only abnormal  results are displayed) Results for orders placed or performed during the hospital encounter of 04/01/24  CBC with Differential   Collection Time: 04/01/24 11:39 PM  Result Value Ref Range   WBC 14.7 (H) 4.0 - 10.5 K/uL   RBC 3.81 (L) 4.22 - 5.81 MIL/uL   Hemoglobin 12.2 (L) 13.0 - 17.0 g/dL   HCT 58.7 60.9 - 47.9 %   MCV 108.1 (H) 80.0 - 100.0 fL   MCH 32.0 26.0 - 34.0 pg   MCHC 29.6 (L) 30.0 - 36.0 g/dL   RDW 85.0 88.4 - 84.4 %   Platelets 188 150 - 400 K/uL   nRBC 0.5 (H) 0.0 - 0.2 %   Neutrophils Relative % 52 %   Neutro Abs 7.7 1.7 - 7.7 K/uL   Lymphocytes Relative 29 %   Lymphs Abs 4.2 (H) 0.7 - 4.0 K/uL   Monocytes Relative 14 %    Monocytes Absolute 2.1 (H) 0.1 - 1.0 K/uL   Eosinophils Relative 0 %   Eosinophils Absolute 0.0 0.0 - 0.5 K/uL   Basophils Relative 0 %   Basophils Absolute 0.0 0.0 - 0.1 K/uL   WBC Morphology MORPHOLOGY UNREMARKABLE    RBC Morphology MORPHOLOGY UNREMARKABLE    Smear Review Normal platelet morphology    Immature Granulocytes 5 %   Abs Immature Granulocytes 0.75 (H) 0.00 - 0.07 K/uL  Comprehensive metabolic panel   Collection Time: 04/01/24 11:39 PM  Result Value Ref Range   Sodium 149 (H) 135 - 145 mmol/L   Potassium 4.3 3.5 - 5.1 mmol/L   Chloride 112 (H) 98 - 111 mmol/L   CO2 15 (L) 22 - 32 mmol/L   Glucose, Bld 200 (H) 70 - 99 mg/dL   BUN 88 (H) 8 - 23 mg/dL   Creatinine, Ser 5.74 (H) 0.61 - 1.24 mg/dL   Calcium  8.1 (L) 8.9 - 10.3 mg/dL   Total Protein 5.0 (L) 6.5 - 8.1 g/dL   Albumin 1.8 (L) 3.5 - 5.0 g/dL   AST 513 (H) 15 - 41 U/L   ALT 362 (H) 0 - 44 U/L   Alkaline Phosphatase 90 38 - 126 U/L   Total Bilirubin 0.6 0.0 - 1.2 mg/dL   GFR, Estimated 14 (L) >60 mL/min   Anion gap 22 (H) 5 - 15  Lactic acid, plasma   Collection Time: 04/01/24 11:39 PM  Result Value Ref Range   Lactic Acid, Venous >9.0 (HH) 0.5 - 1.9 mmol/L  Protime-INR   Collection Time: 04/01/24 11:39 PM  Result Value Ref Range   Prothrombin Time 21.5 (H) 11.4 - 15.2 seconds   INR 1.8 (H) 0.8 - 1.2  Troponin I (High Sensitivity)   Collection Time: 04/01/24 11:39 PM  Result Value Ref Range   Troponin I (High Sensitivity) 361 (HH) <18 ng/L  I-stat chem 8, ED (not at [facility], [facility] or [facility])   Collection Time: 04/01/24 11:56 PM  Result Value Ref Range   Sodium 147 (H) 135 - 145 mmol/L   Potassium 4.3 3.5 - 5.1 mmol/L   Chloride 113 (H) 98 - 111 mmol/L   BUN 96 (H) 8 - 23 mg/dL   Creatinine, Ser 6.29 (H) 0.61 - 1.24 mg/dL   Glucose, Bld 808 (H) 70 - 99 mg/dL   Calcium , Ion 1.06 (L) 1.15 - 1.40 mmol/L   TCO2 17 (L) 22 - 32 mmol/L   Hemoglobin 11.6 (L) 13.0 - 17.0 g/dL   HCT 65.9 (L) 60.9 - 47.9 %  I-Stat  arterial blood gas, ED   Collection Time: April 09, 2024 12:06 AM  Result Value Ref Range   pH, Arterial 6.920 (LL) 7.35 - 7.45   pCO2 arterial 59.4 (H) 32 - 48 mmHg   pO2, Arterial 450 (H) 83 - 108 mmHg   Bicarbonate 12.5 (L) 20.0 - 28.0 mmol/L   TCO2 14 (L) 22 - 32 mmol/L   O2 Saturation 100 %   Acid-base deficit 20.0 (H) 0.0 - 2.0 mmol/L   Sodium 146 (H) 135 - 145 mmol/L   Potassium 4.0 3.5 - 5.1 mmol/L   Calcium , Ion 1.07 (L) 1.15 - 1.40 mmol/L   HCT 32.0 (L) 39.0 - 52.0 %   Hemoglobin 10.9 (L) 13.0 - 17.0 g/dL   Patient temperature 04.3 F    Collection site RADIAL, ALLEN'S TEST ACCEPTABLE    Drawn by RT    Sample type ARTERIAL    Comment NOTIFIED PHYSICIAN   Resp panel by RT-PCR (RSV, Flu A&B, Covid) Anterior Nasal Swab   Collection Time: 04-09-2024 12:08 AM   Specimen: Anterior Nasal Swab  Result Value Ref Range   SARS Coronavirus 2 by RT PCR NEGATIVE NEGATIVE   Influenza A by PCR NEGATIVE NEGATIVE   Influenza B by PCR NEGATIVE NEGATIVE   Resp Syncytial Virus by PCR NEGATIVE NEGATIVE  Urinalysis, w/ Reflex to Culture (Infection Suspected) -Urine, Clean Catch   Collection Time: 04-09-24 12:08 AM  Result Value Ref Range   Specimen Source URINE, CLEAN CATCH    Color, Urine YELLOW YELLOW   APPearance CLOUDY (A) CLEAR   Specific Gravity, Urine 1.017 1.005 - 1.030   pH 5.0 5.0 - 8.0   Glucose, UA NEGATIVE NEGATIVE mg/dL   Hgb urine dipstick MODERATE (A) NEGATIVE   Bilirubin Urine NEGATIVE NEGATIVE   Ketones, ur NEGATIVE NEGATIVE mg/dL   Protein, ur 899 (A) NEGATIVE mg/dL   Nitrite NEGATIVE NEGATIVE   Leukocytes,Ua NEGATIVE NEGATIVE   RBC / HPF >50 0 - 5 RBC/hpf   WBC, UA 0-5 0 - 5 WBC/hpf   Bacteria, UA RARE (A) NONE SEEN   Squamous Epithelial / HPF 0-5 0 - 5 /HPF   Sperm, UA PRESENT   I-Stat Lactic Acid, ED   Collection Time: 09-Apr-2024 12:16 AM  Result Value Ref Range   Lactic Acid, Venous 12.6 (HH) 0.5 - 1.9 mmol/L   Comment NOTIFIED PHYSICIAN   I-Stat Lactic Acid, ED    Collection Time: 09-Apr-2024  1:46 AM  Result Value Ref Range   Lactic Acid, Venous 13.4 (HH) 0.5 - 1.9 mmol/L   Comment NOTIFIED PHYSICIAN   I-Stat venous blood gas, ([facility] ED, [facility], [facility])   Collection Time: 04/09/2024  1:55 AM  Result Value Ref Range   pH, Ven 7.080 (LL) 7.25 - 7.43   pCO2, Ven 39.8 (L) 44 - 60 mmHg   pO2, Ven 83 (H) 32 - 45 mmHg   Bicarbonate 11.8 (L) 20.0 - 28.0 mmol/L   TCO2 13 (L) 22 - 32 mmol/L   O2 Saturation 91 %   Acid-base deficit 17.0 (H) 0.0 - 2.0 mmol/L   Sodium 146 (H) 135 - 145 mmol/L   Potassium 4.1 3.5 - 5.1 mmol/L   Calcium , Ion 0.91 (L) 1.15 - 1.40 mmol/L   HCT 32.0 (L) 39.0 - 52.0 %   Hemoglobin 10.9 (L) 13.0 - 17.0 g/dL   Sample type VENOUS    Comment NOTIFIED PHYSICIAN    CT CHEST ABDOMEN PELVIS WO CONTRAST Result Date: 2024-04-09 CLINICAL DATA:  Trauma. EXAM:  CT CHEST, ABDOMEN AND PELVIS WITHOUT CONTRAST TECHNIQUE: Multidetector CT imaging of the chest, abdomen and pelvis was performed following the standard protocol without IV contrast. RADIATION DOSE REDUCTION: This exam was performed according to the departmental dose-optimization program which includes automated exposure control, adjustment of the mA and/or kV according to patient size and/or use of iterative reconstruction technique. COMPARISON:  None Available. FINDINGS: CT CHEST FINDINGS Cardiovascular: Heart and aorta are normal in size. There is no pericardial effusion. There are atherosclerotic calcifications of the aorta. Mediastinum/Nodes: Endotracheal tube tip is 5.8 cm above the carina. Enteric tube is seen throughout a nondilated esophagus. There some prominent lower paraesophageal lymph nodes measuring up to 8 mm. Enlarged low right paratracheal lymph node measures 10 mm. Visualized esophagus is within normal limits. There is anterior mediastinal hematoma at the level of the heart measuring up to 2.8 cm in thickness. There is a small amount of pneumomediastinum image 3/38 in the region of the  hematoma. Lungs/Pleura: Tree-in-bud opacities are seen diffusely throughout both lungs in the lower lung predominance. Patchy/nodular densities are seen in the right lower lobe measuring up to 10 mm. Additional ill-defined nodular densities are seen in the left lower lobe measuring up to 10 mm. Cystic lesion is seen in the left upper lobe with peripheral spiculated nodule. The nodular density measures 11 x 17 mm in there is mild surrounding ground-glass opacity. There is no pleural effusion or pneumothorax. Musculoskeletal: There are anterior right second through sixth rib fractures. There are anterior left second through fifth rib fractures. No definitive sternal fractures are seen. Degenerative changes affect the spine. CT ABDOMEN PELVIS FINDINGS Hepatobiliary: No hepatic injury or perihepatic hematoma. Gallbladder is unremarkable Pancreas: Unremarkable. No pancreatic ductal dilatation or surrounding inflammatory changes. Spleen: No splenic injury or perisplenic hematoma. Adrenals/Urinary Tract: There is nonspecific bilateral adrenal thickening. The bladder is decompressed by Foley catheter. There is bilateral renal atrophy. The kidneys are otherwise within normal limits. Stomach/Bowel: There are mildly dilated loops of mid and proximal small bowel measuring up to 3.6 cm. The stomach is markedly dilated with air-fluid level. The tip of the enteric tube is in the body of the stomach. No definitive transition point visualized. The appendix is not seen. Colon is within normal limits. No focal wall thickening or inflammation. No pneumatosis or free air. Vascular/Lymphatic: Aortic atherosclerosis. No enlarged abdominal or pelvic lymph nodes. Reproductive: Prostate is unremarkable. Other: No abdominal wall hernia or abnormality. No abdominopelvic ascites. Musculoskeletal: Degenerative changes affect the spine. No acute fractures are identified. IMPRESSION: 1. Anterior mediastinal hematoma at the level of the heart.  There is a small amount of pneumomediastinum in the region of the hematoma. 2. Bilateral anterior rib fractures. No pneumothorax. 3. Extensive Tree-in-bud opacities throughout both lungs with patchy/nodular densities in the lower lobes. Findings are favored as infectious/inflammatory. 4. Cystic lesion in the left upper lobe with peripheral spiculated nodule. Findings are worrisome for neoplasm. 5. Mildly dilated loops of mid and proximal small bowel without definitive transition point. Findings may represent ileus or partial small bowel obstruction. 6. Marked distention of the stomach with air-fluid level. The tip of the enteric tube is in the body of the stomach. 7. Aortic atherosclerosis. Aortic Atherosclerosis (ICD10-I70.0). Electronically Signed   By: Greig Pique M.D.   On: 2024-04-29 01:00   CT Head Wo Contrast Result Date: April 29, 2024 CLINICAL DATA:  Initial evaluation for acute syncope/presyncope, stroke suspected, CPR. EXAM: CT HEAD WITHOUT CONTRAST TECHNIQUE: Contiguous axial images were obtained from the base  of the skull through the vertex without intravenous contrast. RADIATION DOSE REDUCTION: This exam was performed according to the departmental dose-optimization program which includes automated exposure control, adjustment of the mA and/or kV according to patient size and/or use of iterative reconstruction technique. COMPARISON:  None Available. FINDINGS: Brain: Cerebral volume within normal limits for age. There is question of subtle/early loss of gray-white matter differentiation about both cerebral hemispheres. Given history of CPR, finding could reflect early changes of anoxic injury. No acute intracranial hemorrhage. No acute cortically based infarct. No mass lesion, midline shift or mass effect. No hydrocephalus or extra-axial fluid collection. Scattered patchy hypodensity involving the supratentorial cerebral white matter, likely changes of chronic microvascular ischemic disease. Vascular:  No abnormal hyperdense vessel. Calcified atherosclerosis present at skull base. Skull: Scalp soft tissues demonstrate no acute finding. Calvarium intact. Sinuses/Orbits: Globes orbital soft tissues within normal limits. Scattered mucosal thickening noted about the sphenoid ethmoidal sinuses. No significant mastoid effusion. Other: Patient is intubated. IMPRESSION: 1. Question subtle/early loss of gray-white matter differentiation with loss of cortical sulcation. Given history of CPR, findings could reflect early changes of anoxic injury and developing cerebral edema. Correlation with dedicated MRI could be performed for further evaluation as warranted. 2. No other acute intracranial abnormality. 3. Probable underlying mild chronic microvascular ischemic disease. Electronically Signed   By: Morene Hoard M.D.   On: 13-Apr-2024 00:50   DG Chest Portable 1 View Result Date: 04/13/24 CLINICAL DATA:  CPR, cardiac arrest. EXAM: PORTABLE CHEST 1 VIEW COMPARISON:  Chest x-ray 07/20/2022 FINDINGS: Endotracheal tube tip is 6 cm above the carina. Enteric tube extends below the diaphragm. The cardiomediastinal silhouette is within normal limits. There are mild patchy airspace opacities throughout both mid and lower lungs. There is blunting of the right costophrenic angle. There is no pneumothorax. No acute fractures are seen. IMPRESSION: 1. Endotracheal tube tip is 6 cm above the carina. 2. Mild patchy airspace opacities throughout both mid and lower lungs, which may represent edema or infection. Electronically Signed   By: Greig Pique M.D.   On: 2024/04/13 00:04    EKG: EKG Interpretation Date/Time:  Saturday April 01 2024 23:37:05 EDT Ventricular Rate:  126 PR Interval:  117 QRS Duration:  119 QT Interval:  391 QTC Calculation: 567 R Axis:   -57  Text Interpretation: Ectopic atrial tachycardia, unifocal LAD, consider left anterior fascicular block Low voltage, precordial leads Confirmed by Nettie,  Nathaneal Sommers ([id number]) on 04-13-24 12:35:59 AM  Radiology: CT CHEST ABDOMEN PELVIS WO CONTRAST Result Date: 04-13-24 CLINICAL DATA:  Trauma. EXAM: CT CHEST, ABDOMEN AND PELVIS WITHOUT CONTRAST TECHNIQUE: Multidetector CT imaging of the chest, abdomen and pelvis was performed following the standard protocol without IV contrast. RADIATION DOSE REDUCTION: This exam was performed according to the departmental dose-optimization program which includes automated exposure control, adjustment of the mA and/or kV according to patient size and/or use of iterative reconstruction technique. COMPARISON:  None Available. FINDINGS: CT CHEST FINDINGS Cardiovascular: Heart and aorta are normal in size. There is no pericardial effusion. There are atherosclerotic calcifications of the aorta. Mediastinum/Nodes: Endotracheal tube tip is 5.8 cm above the carina. Enteric tube is seen throughout a nondilated esophagus. There some prominent lower paraesophageal lymph nodes measuring up to 8 mm. Enlarged low right paratracheal lymph node measures 10 mm. Visualized esophagus is within normal limits. There is anterior mediastinal hematoma at the level of the heart measuring up to 2.8 cm in thickness. There is a small amount of pneumomediastinum image 3/38 in  the region of the hematoma. Lungs/Pleura: Tree-in-bud opacities are seen diffusely throughout both lungs in the lower lung predominance. Patchy/nodular densities are seen in the right lower lobe measuring up to 10 mm. Additional ill-defined nodular densities are seen in the left lower lobe measuring up to 10 mm. Cystic lesion is seen in the left upper lobe with peripheral spiculated nodule. The nodular density measures 11 x 17 mm in there is mild surrounding ground-glass opacity. There is no pleural effusion or pneumothorax. Musculoskeletal: There are anterior right second through sixth rib fractures. There are anterior left second through fifth rib fractures. No definitive sternal fractures  are seen. Degenerative changes affect the spine. CT ABDOMEN PELVIS FINDINGS Hepatobiliary: No hepatic injury or perihepatic hematoma. Gallbladder is unremarkable Pancreas: Unremarkable. No pancreatic ductal dilatation or surrounding inflammatory changes. Spleen: No splenic injury or perisplenic hematoma. Adrenals/Urinary Tract: There is nonspecific bilateral adrenal thickening. The bladder is decompressed by Foley catheter. There is bilateral renal atrophy. The kidneys are otherwise within normal limits. Stomach/Bowel: There are mildly dilated loops of mid and proximal small bowel measuring up to 3.6 cm. The stomach is markedly dilated with air-fluid level. The tip of the enteric tube is in the body of the stomach. No definitive transition point visualized. The appendix is not seen. Colon is within normal limits. No focal wall thickening or inflammation. No pneumatosis or free air. Vascular/Lymphatic: Aortic atherosclerosis. No enlarged abdominal or pelvic lymph nodes. Reproductive: Prostate is unremarkable. Other: No abdominal wall hernia or abnormality. No abdominopelvic ascites. Musculoskeletal: Degenerative changes affect the spine. No acute fractures are identified. IMPRESSION: 1. Anterior mediastinal hematoma at the level of the heart. There is a small amount of pneumomediastinum in the region of the hematoma. 2. Bilateral anterior rib fractures. No pneumothorax. 3. Extensive Tree-in-bud opacities throughout both lungs with patchy/nodular densities in the lower lobes. Findings are favored as infectious/inflammatory. 4. Cystic lesion in the left upper lobe with peripheral spiculated nodule. Findings are worrisome for neoplasm. 5. Mildly dilated loops of mid and proximal small bowel without definitive transition point. Findings may represent ileus or partial small bowel obstruction. 6. Marked distention of the stomach with air-fluid level. The tip of the enteric tube is in the body of the stomach. 7. Aortic  atherosclerosis. Aortic Atherosclerosis (ICD10-I70.0). Electronically Signed   By: Greig Pique M.D.   On: 2024-04-22 01:00   CT Head Wo Contrast Result Date: 04/22/24 CLINICAL DATA:  Initial evaluation for acute syncope/presyncope, stroke suspected, CPR. EXAM: CT HEAD WITHOUT CONTRAST TECHNIQUE: Contiguous axial images were obtained from the base of the skull through the vertex without intravenous contrast. RADIATION DOSE REDUCTION: This exam was performed according to the departmental dose-optimization program which includes automated exposure control, adjustment of the mA and/or kV according to patient size and/or use of iterative reconstruction technique. COMPARISON:  None Available. FINDINGS: Brain: Cerebral volume within normal limits for age. There is question of subtle/early loss of gray-white matter differentiation about both cerebral hemispheres. Given history of CPR, finding could reflect early changes of anoxic injury. No acute intracranial hemorrhage. No acute cortically based infarct. No mass lesion, midline shift or mass effect. No hydrocephalus or extra-axial fluid collection. Scattered patchy hypodensity involving the supratentorial cerebral white matter, likely changes of chronic microvascular ischemic disease. Vascular: No abnormal hyperdense vessel. Calcified atherosclerosis present at skull base. Skull: Scalp soft tissues demonstrate no acute finding. Calvarium intact. Sinuses/Orbits: Globes orbital soft tissues within normal limits. Scattered mucosal thickening noted about the sphenoid ethmoidal sinuses. No significant  mastoid effusion. Other: Patient is intubated. IMPRESSION: 1. Question subtle/early loss of gray-white matter differentiation with loss of cortical sulcation. Given history of CPR, findings could reflect early changes of anoxic injury and developing cerebral edema. Correlation with dedicated MRI could be performed for further evaluation as warranted. 2. No other acute  intracranial abnormality. 3. Probable underlying mild chronic microvascular ischemic disease. Electronically Signed   By: Morene Hoard M.D.   On: 04/06/2024 00:50   DG Chest Portable 1 View Result Date: Apr 06, 2024 CLINICAL DATA:  CPR, cardiac arrest. EXAM: PORTABLE CHEST 1 VIEW COMPARISON:  Chest x-ray 07/20/2022 FINDINGS: Endotracheal tube tip is 6 cm above the carina. Enteric tube extends below the diaphragm. The cardiomediastinal silhouette is within normal limits. There are mild patchy airspace opacities throughout both mid and lower lungs. There is blunting of the right costophrenic angle. There is no pneumothorax. No acute fractures are seen. IMPRESSION: 1. Endotracheal tube tip is 6 cm above the carina. 2. Mild patchy airspace opacities throughout both mid and lower lungs, which may represent edema or infection. Electronically Signed   By: Greig Pique M.D.   On: 04-06-2024 00:04     .Critical Care  Performed by: Nettie Earing, MD Authorized by: Nettie Earing, MD   Critical care provider statement:    Critical care time (minutes):  90   Critical care end time:  2024/04/06 1:54 AM   Critical care was necessary to treat or prevent imminent or life-threatening deterioration of the following conditions:  Cardiac failure, circulatory failure, respiratory failure and sepsis   Critical care was time spent personally by me on the following activities:  Development of treatment plan with patient or surrogate, discussions with consultants, evaluation of patient's response to treatment, examination of patient, ordering and review of laboratory studies, ordering and review of radiographic studies, ordering and performing treatments and interventions, pulse oximetry, re-evaluation of patient's condition and review of old charts   I assumed direction of critical care for this patient from another provider in my specialty: no     Care discussed with: admitting provider      Medications Ordered  in the ED  EPINEPHrine  (ADRENALIN ) 1 MG/10ML injection (  Not Given 06-Apr-2024 0034)  lactated ringers  infusion ( Intravenous New Bag/Given 2024-04-06 0100)  vancomycin  (VANCOREADY) IVPB 2000 mg/400 mL (2,000 mg Intravenous New Bag/Given 04/06/24 0046)  amiodarone  (NEXTERONE ) 1.8 mg/mL load via infusion 150 mg (150 mg Intravenous Bolus from Bag 2024-04-06 0012)    Followed by  amiodarone  (NEXTERONE  PREMIX) 360-4.14 MG/200ML-% (1.8 mg/mL) IV infusion (60 mg/hr Intravenous New Bag/Given 04-06-24 0015)    Followed by  amiodarone  (NEXTERONE  PREMIX) 360-4.14 MG/200ML-% (1.8 mg/mL) IV infusion (has no administration in time range)  sodium bicarbonate  1 mEq/mL injection (  Not Given 04/06/2024 0040)  Chlorhexidine  Gluconate Cloth 2 % PADS 6 each (has no administration in time range)  heparin  injection 5,000 Units (has no administration in time range)  pantoprazole  (PROTONIX ) injection 40 mg (has no administration in time range)  ondansetron  (ZOFRAN ) injection 4 mg (has no administration in time range)  insulin  aspart (novoLOG ) injection 0-9 Units (has no administration in time range)  aztreonam  (AZACTAM ) 1 g in sodium chloride  0.9 % 100 mL IVPB (has no administration in time range)  metroNIDAZOLE  (FLAGYL ) IVPB 500 mg (has no administration in time range)  0.9 %  sodium chloride  infusion (has no administration in time range)  norepinephrine  (LEVOPHED ) 4mg  in (0.016 mg/mL) premix infusion (has no administration in time range)  norepinephrine  (LEVOPHED ) 4-5 MG/250ML-% infusion SOLN (  Rate/Dose Change Apr 16, 2024 0123)  EPINEPHrine  NaCl 5-0.9 MG/250ML-% premix infusion (15 mcg/min  Rate/Dose Change 04-16-2024 0106)  lactated ringers  bolus 1,000 mL (0 mLs Intravenous Stopped 2024-04-16 0034)    And  lactated ringers  bolus 250 mL (0 mLs Intravenous Stopped 2024-04-16 0047)  aztreonam  (AZACTAM ) 2 g in sodium chloride  0.9 % 100 mL IVPB (0 g Intravenous Stopped 2024/04/16 0123)  metroNIDAZOLE  (FLAGYL ) IVPB 500 mg (500 mg  Intravenous New Bag/Given April 16, 2024 0045)  sodium bicarbonate  150 mEq in dextrose  5 % 1,150 mL infusion ( Intravenous New Bag/Given 2024-04-16 0045)  sodium bicarbonate  injection 100 mEq (100 mEq Intravenous Given 04-16-2024 0015)  fentaNYL  (SUBLIMAZE ) injection 50 mcg (50 mcg Intravenous Given 04/16/24 0115)  midazolam  (VERSED ) injection 2 mg (2 mg Intravenous Given April 16, 2024 0115)  vecuronium  (NORCURON ) injection 10 mg (10 mg Intravenous Given 2024/04/16 0116)                                    Medical Decision Making EMS with witnessed cardiac arrest   Amount and/or Complexity of Data Reviewed Independent Historian: EMS    Details: See above  External Data Reviewed: notes.    Details: Previous notes reviewed  Labs: ordered.    Details: Negative covid and flu. Sodium 149 which was high, normal potassium 4.3, elevated creatinine 4.25. elevated PT 21.5, elevated INR 1.8, lactate critically high 12.56, urine is negative for UTI, elevated AST 486 elevated, elevated ALT 362, elevated troponin 361  Radiology: ordered and independent interpretation performed.    Details: ETT in expected position on CXR,  CT head negative by me   Risk Prescription drug management. Decision regarding hospitalization.     Final diagnoses:  Cardiac arrest (HCC)  Non-sustained ventricular tachycardia (HCC)  AKI (acute kidney injury) (HCC)  Hypothermia, initial encounter  Ileus (HCC)  Lung mass  Closed fracture of multiple ribs, unspecified laterality, initial encounter  Elevated transaminase level   The patient appears reasonably stabilized for admission considering the current resources, flow, and capabilities available in the ED at this time, and I doubt any other [facility] requiring further screening and/or treatment in the ED prior to admission.  ED Discharge Orders     None          Raylinn Kosar, MD 16-Apr-2024 [id number]

## 2024-04-03 DEATH — deceased

## 2024-04-04 LAB — CULTURE, RESPIRATORY W GRAM STAIN

## 2024-04-07 LAB — CULTURE, BLOOD (ROUTINE X 2)
Culture: NO GROWTH
Special Requests: ADEQUATE

## 2024-04-12 LAB — CULTURE, BLOOD (ROUTINE X 2): Special Requests: ADEQUATE

## 2024-04-14 ENCOUNTER — Ambulatory Visit: Admitting: Nurse Practitioner

## 2024-04-24 ENCOUNTER — Other Ambulatory Visit (HOSPITAL_COMMUNITY)

## 2024-04-24 ENCOUNTER — Encounter (HOSPITAL_COMMUNITY): Payer: Self-pay

## 2024-06-15 LAB — MISCELLANEOUS TEST
